# Patient Record
Sex: Female | Born: 1965 | Hispanic: No | Marital: Married | Smoking: Never smoker
Health system: Southern US, Community
[De-identification: ages and names within clinical notes are randomized; demographics above are authoritative.]

## PROBLEM LIST (undated history)

## (undated) DIAGNOSIS — E079 Disorder of thyroid, unspecified: Secondary | ICD-10-CM

## (undated) DIAGNOSIS — M1990 Unspecified osteoarthritis, unspecified site: Secondary | ICD-10-CM

## (undated) DIAGNOSIS — K602 Anal fissure, unspecified: Principal | ICD-10-CM

## (undated) DIAGNOSIS — J209 Acute bronchitis, unspecified: Secondary | ICD-10-CM

## (undated) DIAGNOSIS — L4050 Arthropathic psoriasis, unspecified: Secondary | ICD-10-CM

## (undated) DIAGNOSIS — J302 Other seasonal allergic rhinitis: Secondary | ICD-10-CM

## (undated) DIAGNOSIS — J069 Acute upper respiratory infection, unspecified: Secondary | ICD-10-CM

## (undated) HISTORY — DX: Acute upper respiratory infection, unspecified: J06.9

## (undated) HISTORY — DX: Disorder of thyroid, unspecified: E07.9

## (undated) HISTORY — DX: Other seasonal allergic rhinitis: J30.2

## (undated) HISTORY — DX: Acute bronchitis, unspecified: J20.9

## (undated) HISTORY — DX: Anal fissure, unspecified: K60.2

## (undated) HISTORY — DX: Unspecified osteoarthritis, unspecified site: M19.90

## (undated) HISTORY — DX: Arthropathic psoriasis, unspecified: L40.50

---

## 1999-12-09 ENCOUNTER — Encounter: Admission: RE | Admit: 1999-12-09 | Discharge: 2000-02-03 | Payer: Self-pay | Admitting: Family Medicine

## 2000-03-17 ENCOUNTER — Other Ambulatory Visit: Admission: RE | Admit: 2000-03-17 | Discharge: 2000-03-17 | Payer: Self-pay | Admitting: Obstetrics & Gynecology

## 2001-09-06 ENCOUNTER — Other Ambulatory Visit: Admission: RE | Admit: 2001-09-06 | Discharge: 2001-09-06 | Payer: Self-pay | Admitting: Obstetrics & Gynecology

## 2002-01-18 ENCOUNTER — Encounter: Admission: RE | Admit: 2002-01-18 | Discharge: 2002-02-27 | Payer: Self-pay | Admitting: Internal Medicine

## 2002-11-30 DIAGNOSIS — K602 Anal fissure, unspecified: Secondary | ICD-10-CM

## 2002-11-30 HISTORY — DX: Anal fissure, unspecified: K60.2

## 2003-01-01 ENCOUNTER — Other Ambulatory Visit: Admission: RE | Admit: 2003-01-01 | Discharge: 2003-01-01 | Payer: Self-pay | Admitting: Obstetrics & Gynecology

## 2004-02-18 ENCOUNTER — Other Ambulatory Visit: Admission: RE | Admit: 2004-02-18 | Discharge: 2004-02-18 | Payer: Self-pay | Admitting: Obstetrics & Gynecology

## 2004-05-14 ENCOUNTER — Other Ambulatory Visit: Admission: RE | Admit: 2004-05-14 | Discharge: 2004-05-14 | Payer: Self-pay | Admitting: Obstetrics & Gynecology

## 2005-02-19 ENCOUNTER — Ambulatory Visit: Payer: Self-pay | Admitting: Family Medicine

## 2005-02-23 ENCOUNTER — Other Ambulatory Visit: Admission: RE | Admit: 2005-02-23 | Discharge: 2005-02-23 | Payer: Self-pay | Admitting: Obstetrics & Gynecology

## 2005-02-27 ENCOUNTER — Encounter: Admission: RE | Admit: 2005-02-27 | Discharge: 2005-02-27 | Payer: Self-pay | Admitting: Family Medicine

## 2006-02-23 ENCOUNTER — Ambulatory Visit: Payer: Self-pay | Admitting: Family Medicine

## 2006-11-30 DIAGNOSIS — J209 Acute bronchitis, unspecified: Secondary | ICD-10-CM

## 2006-11-30 HISTORY — DX: Acute bronchitis, unspecified: J20.9

## 2006-12-09 ENCOUNTER — Ambulatory Visit: Payer: Self-pay | Admitting: Internal Medicine

## 2007-10-12 ENCOUNTER — Ambulatory Visit: Payer: Self-pay | Admitting: Internal Medicine

## 2007-10-12 DIAGNOSIS — J069 Acute upper respiratory infection, unspecified: Secondary | ICD-10-CM | POA: Insufficient documentation

## 2007-10-12 HISTORY — DX: Acute upper respiratory infection, unspecified: J06.9

## 2010-09-29 ENCOUNTER — Encounter: Payer: Self-pay | Admitting: Internal Medicine

## 2010-10-20 ENCOUNTER — Encounter: Payer: Self-pay | Admitting: Family Medicine

## 2010-10-28 ENCOUNTER — Ambulatory Visit: Payer: Self-pay | Admitting: Family Medicine

## 2011-01-01 NOTE — Letter (Signed)
Summary: [facility]  [facility]   Imported By: Lanelle Bal 10/09/2010 14:00:33  _____________________________________________________________________  External Attachment:    Type:   Image     Comment:   External Document

## 2011-01-01 NOTE — Miscellaneous (Signed)
Summary: Order for Pneumococcal Vaccine/[facility]  Order for Pneumococcal Vaccine/[facility]   Imported By: Lanelle Bal 11/01/2010 10:04:58  _____________________________________________________________________  External Attachment:    Type:   Image     Comment:   External Document

## 2011-01-01 NOTE — Assessment & Plan Note (Signed)
Summary: FOR PNEUMONIA SHOT//PH  Nurse Visit   Allergies: No Known Drug Allergies  Immunizations Administered:  Pneumonia Vaccine:    Vaccine Type: Pneumovax    Site: left deltoid    Mfr: Merck    Dose: 0.5 ml    Route: IM    Given by: Jeremy Johann CMA    Exp. Date: 03/31/2012    Lot #: [id number]    VIS given: 11/04/09 version given October 28, 2010.  Orders Added: 1)  Pneumococcal Vaccine [90732] 2)  Admin 1st Vaccine [16109]

## 2012-04-11 ENCOUNTER — Encounter (INDEPENDENT_AMBULATORY_CARE_PROVIDER_SITE_OTHER): Payer: Self-pay | Admitting: Surgery

## 2012-04-11 ENCOUNTER — Ambulatory Visit (INDEPENDENT_AMBULATORY_CARE_PROVIDER_SITE_OTHER): Payer: BC Managed Care – PPO | Admitting: Surgery

## 2012-04-11 VITALS — BP 112/70 | HR 64 | Temp 97.8°F | Ht 62.0 in | Wt 147.2 lb

## 2012-04-11 DIAGNOSIS — K602 Anal fissure, unspecified: Secondary | ICD-10-CM

## 2012-04-11 DIAGNOSIS — L4050 Arthropathic psoriasis, unspecified: Secondary | ICD-10-CM | POA: Insufficient documentation

## 2012-04-11 MED ORDER — AMBULATORY NON FORMULARY MEDICATION: 1.0000 "application " | Freq: Four times a day (QID) | Status: DC

## 2012-04-11 NOTE — Progress Notes (Signed)
Subjective:     Patient ID: Wendy [name], female   DOB: Jan 04, 1966, 46 y.o.   MRN: [id number]  HPI  Wendy [name]  1966/07/22 [id number]  Patient Care Team: Ezequiel Kayser, MD as PCP - General (Internal Medicine) Susy Frizzle, MD as Consulting Physician (Rheumatology)  This patient is a 46 y.o.female who presents today for surgical evaluation.   Reason for visit: Anal pain. 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  Patient is a pleasant healthy female. 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 No major changes there. She is mostly vegetarian and tends to have a high-fiber diet.  She was diagnosed with a fissure hole almost a decade ago. It resolved with diltiazem cream. Dr. Earlene Plater, my partner, has retired since that time. She noted recurrent symptoms and wished to be evaluated.  Patient notes she has pain with bowel movements. His rather short. This started about 3 weeks ago. She did try to use Preparation H. Does not help. Tends to have to wipe a lot to clean the area. Notes a little bit of blood with that. No history of Crohn's or ulcerative colitis. No major change in her bowel habits. BM 2x/day mostly "normal"  No drainage of pus. No incontinence to flatus or stool  Patient Active Problem List  Diagnoses  . Psoriasis with arthropathy  . Anal fissure    Past Medical History  Diagnosis Date  . Arthritis   . Thyroid disease   . Anal fissure 2004    Tx'd with diltiazem  gel by Dr. Kendrick Ranch  . Bronchitis, acute 2008  . URI 10/12/2007    Qualifier: Diagnosis of  By: Alwyn Ren MD, Chrissie Noa      History reviewed. No pertinent past surgical history.  History   Social History  . Marital Status: Married    Spouse Name: N/A    Number of Children: N/A  . Years of Education: N/A   Occupational History  . Not on file.   Social History Main Topics  . Smoking status: Never Smoker   . Smokeless tobacco: Not on file  . Alcohol Use: Yes     occ  . Drug  Use: No  . Sexually Active:    Other Topics Concern  . Not on file   Social History Narrative  . No narrative on file    History reviewed. No pertinent family history.  Current Outpatient Prescriptions  Medication Sig Dispense Refill  . HUMIRA PEN 40 MG/0.8ML injection       . levothyroxine (SYNTHROID, LEVOTHROID) 75 MCG tablet       . AMBULATORY NON FORMULARY MEDICATION Place 1 application rectally 4 (four) times daily. Diltiazem 2% compounded suspension.  1 Tube  2     No Known Allergies  BP 112/70  Pulse 64  Temp(Src) 97.8 F (36.6 C) (Temporal)  Ht 5\' 2"  (1.575 m)  Wt 147 lb 3.2 oz (66.769 kg)  BMI 26.92 kg/m2  SpO2 99%  No results found.   Review of Systems  Constitutional: Negative for fever, chills, diaphoresis, appetite change and fatigue.  HENT: Negative for ear pain, sore throat, trouble swallowing, neck pain and ear discharge.   Eyes: Negative for photophobia, discharge and visual disturbance.  Respiratory: Negative for cough, choking, chest tightness and shortness of breath.   Cardiovascular: Negative for chest pain and palpitations.       Patient walks 60 minutes for about 2 miles without difficulty.  No exertional chest/neck/shoulder/arm pain.   Gastrointestinal: Negative  for nausea, vomiting, abdominal pain, diarrhea, constipation, anal bleeding and rectal pain.       No personal nor family history of GI/colon cancer, inflammatory bowel disease, irritable bowel syndrome, allergy such as Celiac Sprue, dietary/dairy problems, colitis, ulcers nor gastritis.    No recent sick contacts/gastroenteritis.  No travel outside the country.  No changes in diet.    Genitourinary: Negative for dysuria, frequency and difficulty urinating.  Musculoskeletal: Negative for myalgias and gait problem.  Skin: Negative for color change, pallor and rash.  Neurological: Negative for dizziness, speech difficulty, weakness and numbness.  Hematological: Negative for adenopathy.    Psychiatric/Behavioral: Negative for confusion and agitation. The patient is not nervous/anxious.        Objective:   Physical Exam  Constitutional: She is oriented to person, place, and time. She appears well-developed and well-nourished. No distress.  HENT:  Head: Normocephalic.  Mouth/Throat: Oropharynx is clear and moist. No oropharyngeal exudate.  Eyes: Conjunctivae and EOM are normal. Pupils are equal, round, and reactive to light. No scleral icterus.  Neck: Normal range of motion. Neck supple. No tracheal deviation present.  Cardiovascular: Normal rate, regular rhythm and intact distal pulses.   Pulmonary/Chest: Effort normal and breath sounds normal. No respiratory distress. She exhibits no tenderness.  Abdominal: Soft. She exhibits no distension and no mass. There is no tenderness. Hernia confirmed negative in the right inguinal area and confirmed negative in the left inguinal area.  Genitourinary: No vaginal discharge found.       Perianal skin clean with good hygiene.  No pruritis.  No external skin tags / hemorrhoids of significance.  No pilonidal disease.    No abscess/fistula.    Small posterior midline anal fissure with minimal skin tag Sphincter tone high end of normal  Musculoskeletal: Normal range of motion. She exhibits no tenderness.  Lymphadenopathy:    She has no cervical adenopathy.       Right: No inguinal adenopathy present.       Left: No inguinal adenopathy present.  Neurological: She is alert and oriented to person, place, and time. No cranial nerve deficit. She exhibits normal muscle tone. Coordination normal.  Skin: Skin is warm and dry. No rash noted. She is not diaphoretic. No erythema.  Psychiatric: She has a normal mood and affect. Her behavior is normal. Judgment and thought content normal.       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  The  pathophysiology of anal fissure and differential diagnosis was discussed.  Natural history progression  was discussed.   I stressed the importance of a bowel regimen to have daily soft bowel movements to minimize progression of disease.   I discussed the use of warm soaks &  muscle relaxant, diltiazem, to help the anal sphincter relax, allow the spasming to stop, and help the tear/fissure to heal.  If non-operative treatment does not heal the fissure, I would recommend examination under anesthesia for better examination to confirm the diagnosis and treat by lateral internal sphincterotomy to allow the fissure to heal.  Technique, benefits, alternatives discussed.  Risks such as bleeding, pain, incontinence, recurrence, heart attack, death, and other risks were discussed.    Educational handouts further explaining the pathology, treatment options, and bowel regimen were given as well.  The patient expressed understanding & will follow up 3 weeks, PRN if improves.  If the pain does not resolve in a few weeks or worsens, we should proceed with surgery.

## 2012-04-11 NOTE — Patient Instructions (Signed)
Anal Fissure, Adult An anal fissure is a small tear or crack in the skin around the anus. Bleeding from a fissure usually stops on its own within a few minutes. However, bleeding will often reoccur with each bowel movement until the crack heals.  CAUSES   Passing large, hard stools.   Frequent diarrheal stools.   Constipation.   Inflammatory bowel disease (Crohn's disease or ulcerative colitis).   Infections.   Anal sex.  SYMPTOMS   Small amounts of blood seen on your stools, on toilet paper, or in the toilet after a bowel movement.   Rectal bleeding.   Painful bowel movements.   Itching or irritation around the anus.  DIAGNOSIS Your caregiver will examine the anal area. An anal fissure can usually be seen with careful inspection. A rectal exam may be performed and a short tube (anoscope) may be used to examine the anal canal. TREATMENT   You may be instructed to take fiber supplements. These supplements can soften your stool to help make bowel movements easier.   Sitz baths may be recommended to help heal the tear. Do not use soap in the sitz baths.   A medicated cream or ointment may be prescribed to lessen discomfort.  HOME CARE INSTRUCTIONS   Maintain a diet high in fruits, whole grains, and vegetables. Avoid constipating foods like bananas and dairy products.   Take sitz baths as directed by your caregiver.   Drink enough fluids to keep your urine clear or pale yellow.   Only take over-the-counter or prescription medicines for pain, discomfort, or fever as directed by your caregiver. Do not take aspirin as this may increase bleeding.   Do not use ointments containing numbing medications (anesthetics) or hydrocortisone. They could slow healing.  SEEK MEDICAL CARE IF:   Your fissure is not completely healed within 3 days.   You have further bleeding.   You have a fever.   You have diarrhea mixed with blood.   You have pain.   Your problem is getting worse  rather than better.  MAKE SURE YOU:   Understand these instructions.   Will watch your condition.   Will get help right away if you are not doing well or get worse.  Document Released: 11/16/2005 Document Revised: 11/05/2011 Document Reviewed: 05/03/2011 [facility] Patient Information 2012 [place], [place].  GETTING TO GOOD BOWEL HEALTH. Irregular bowel habits such as constipation and diarrhea can lead to many problems over time.  Having one soft bowel movement a day is the most important way to prevent further problems.  The anorectal canal is designed to handle stretching and feces to safely manage our ability to get rid of solid waste (feces, poop, stool) out of our body.  BUT, hard constipated stools can act like ripping concrete bricks and diarrhea can be a burning fire to this very sensitive area of our body, causing inflamed hemorrhoids, anal fissures, increasing risk is perirectal abscesses, abdominal pain/bloating, an making irritable bowel worse.     The goal: ONE SOFT BOWEL MOVEMENT A DAY!  To have soft, regular bowel movements:    Drink at least 8 tall glasses of water a day.     Take plenty of fiber.  Fiber is the undigested part of plant food that passes into the colon, acting s "natures broom" to encourage bowel motility and movement.  Fiber can absorb and hold large amounts of water. This results in a larger, bulkier stool, which is soft and easier to pass. Work gradually  over several weeks up to 6 servings a day of fiber (25g a day even more if needed) in the form of: o Vegetables -- Root (potatoes, carrots, turnips), leafy green (lettuce, salad greens, celery, spinach), or cooked high residue (cabbage, broccoli, etc) o Fruit -- Fresh (unpeeled skin & pulp), Dried (prunes, apricots, cherries, etc ),  or stewed ( applesauce)  o Whole grain breads, pasta, etc (whole wheat)  o Bran cereals    Bulking Agents -- This type of water-retaining fiber generally is easily obtained each day by  one of the following:  o Psyllium bran -- The psyllium plant is remarkable because its ground seeds can retain so much water. This product is available as Metamucil, Konsyl, Effersyllium, Per Diem Fiber, or the less expensive generic preparation in drug and health food stores. Although labeled a laxative, it really is not a laxative.  o Methylcellulose -- This is another fiber derived from wood which also retains water. It is available as Citrucel. o Polyethylene Glycol - and "artificial" fiber commonly called Miralax or Glycolax.  It is helpful for people with gassy or bloated feelings with regular fiber o Flax Seed - a less gassy fiber than psyllium   No reading or other relaxing activity while on the toilet. If bowel movements take longer than 5 minutes, you are too constipated   AVOID CONSTIPATION.  High fiber and water intake usually takes care of this.  Sometimes a laxative is needed to stimulate more frequent bowel movements, but    Laxatives are not a good long-term solution as it can wear the colon out. o Osmotics (Milk of Magnesia, Fleets phosphosoda, Magnesium citrate, MiraLax, GoLytely) are safer than  o Stimulants (Senokot, Castor Oil, Dulcolax, Ex Lax)    o Do not take laxatives for more than 7days in a row.    IF SEVERELY CONSTIPATED, try a Bowel Retraining Program: o Do not use laxatives.  o Eat a diet high in roughage, such as bran cereals and leafy vegetables.  o Drink six (6) ounces of prune or apricot juice each morning.  o Eat two (2) large servings of stewed fruit each day.  o Take one (1) heaping tablespoon of a psyllium-based bulking agent twice a day. Use sugar-free sweetener when possible to avoid excessive calories.  o Eat a normal breakfast.  o Set aside 15 minutes after breakfast to sit on the toilet, but do not strain to have a bowel movement.  o If you do not have a bowel movement by the third day, use an enema and repeat the above steps.    Controlling  diarrhea o Switch to liquids and simpler foods for a few days to avoid stressing your intestines further. o Avoid dairy products (especially milk & ice cream) for a short time.  The intestines often can lose the ability to digest lactose when stressed. o Avoid foods that cause gassiness or bloating.  Typical foods include beans and other legumes, cabbage, broccoli, and dairy foods.  Every person has some sensitivity to other foods, so listen to our body and avoid those foods that trigger problems for you. o Adding fiber (Citrucel, Metamucil, psyllium, Miralax) gradually can help thicken stools by absorbing excess fluid and retrain the intestines to act more normally.  Slowly increase the dose over a few weeks.  Too much fiber too soon can backfire and cause cramping & bloating. o Probiotics (such as active yogurt, Align, etc) may help repopulate the intestines and colon with normal bacteria  and calm down a sensitive digestive tract.  Most studies show it to be of mild help, though, and such products can be costly. o Medicines:   Bismuth subsalicylate (ex. Kayopectate, Pepto Bismol) every 30 minutes for up to 6 doses can help control diarrhea.  Avoid if pregnant.   Loperamide (Immodium) can slow down diarrhea.  Start with two tablets (4mg  total) first and then try one tablet every 6 hours.  Avoid if you are having fevers or severe pain.  If you are not better or start feeling worse, stop all medicines and call your doctor for advice o Call your doctor if you are getting worse or not better.  Sometimes further testing (cultures, endoscopy, X-ray studies, bloodwork, etc) may be needed to help diagnose and treat the cause of the diarrhea. o

## 2012-05-05 ENCOUNTER — Encounter (INDEPENDENT_AMBULATORY_CARE_PROVIDER_SITE_OTHER): Payer: BC Managed Care – PPO | Admitting: Surgery

## 2012-05-23 ENCOUNTER — Encounter (INDEPENDENT_AMBULATORY_CARE_PROVIDER_SITE_OTHER): Payer: BC Managed Care – PPO | Admitting: Surgery

## 2012-06-21 ENCOUNTER — Telehealth (INDEPENDENT_AMBULATORY_CARE_PROVIDER_SITE_OTHER): Payer: Self-pay

## 2012-06-21 NOTE — Telephone Encounter (Signed)
LMOM for pt to call me b/c I need to cancel his office on 8/7 and trying to give a new appt date and time.

## 2012-07-06 ENCOUNTER — Encounter (INDEPENDENT_AMBULATORY_CARE_PROVIDER_SITE_OTHER): Payer: BC Managed Care – PPO | Admitting: Surgery

## 2012-07-20 ENCOUNTER — Encounter (INDEPENDENT_AMBULATORY_CARE_PROVIDER_SITE_OTHER): Payer: BC Managed Care – PPO | Admitting: Surgery

## 2013-10-17 ENCOUNTER — Other Ambulatory Visit: Payer: Self-pay | Admitting: Orthopedic Surgery

## 2013-10-17 DIAGNOSIS — M542 Cervicalgia: Secondary | ICD-10-CM

## 2013-10-19 ENCOUNTER — Ambulatory Visit
Admission: RE | Admit: 2013-10-19 | Discharge: 2013-10-19 | Disposition: A | Discharge status: Home / Self Care | Payer: BC Managed Care – PPO | Source: Ambulatory Visit | Attending: Orthopedic Surgery | Admitting: Orthopedic Surgery

## 2013-10-19 DIAGNOSIS — M542 Cervicalgia: Secondary | ICD-10-CM

## 2014-08-27 ENCOUNTER — Other Ambulatory Visit: Payer: Self-pay

## 2014-08-27 DIAGNOSIS — Z1231 Encounter for screening mammogram for malignant neoplasm of breast: Secondary | ICD-10-CM

## 2014-09-04 ENCOUNTER — Encounter (INDEPENDENT_AMBULATORY_CARE_PROVIDER_SITE_OTHER): Payer: Self-pay

## 2014-09-04 ENCOUNTER — Ambulatory Visit
Admission: RE | Admit: 2014-09-04 | Discharge: 2014-09-04 | Disposition: A | Discharge status: Home / Self Care | Payer: BC Managed Care – PPO | Source: Ambulatory Visit

## 2014-09-04 DIAGNOSIS — Z1231 Encounter for screening mammogram for malignant neoplasm of breast: Secondary | ICD-10-CM

## 2016-03-03 DIAGNOSIS — M25511 Pain in right shoulder: Secondary | ICD-10-CM | POA: Diagnosis not present

## 2016-03-03 DIAGNOSIS — M25512 Pain in left shoulder: Secondary | ICD-10-CM | POA: Diagnosis not present

## 2016-03-03 DIAGNOSIS — M542 Cervicalgia: Secondary | ICD-10-CM | POA: Diagnosis not present

## 2016-03-03 DIAGNOSIS — M545 Low back pain: Secondary | ICD-10-CM | POA: Diagnosis not present

## 2016-03-09 DIAGNOSIS — M542 Cervicalgia: Secondary | ICD-10-CM | POA: Diagnosis not present

## 2016-03-09 DIAGNOSIS — M545 Low back pain: Secondary | ICD-10-CM | POA: Diagnosis not present

## 2016-03-09 DIAGNOSIS — M25511 Pain in right shoulder: Secondary | ICD-10-CM | POA: Diagnosis not present

## 2016-03-09 DIAGNOSIS — M25512 Pain in left shoulder: Secondary | ICD-10-CM | POA: Diagnosis not present

## 2016-03-19 DIAGNOSIS — M25512 Pain in left shoulder: Secondary | ICD-10-CM | POA: Diagnosis not present

## 2016-03-19 DIAGNOSIS — M545 Low back pain: Secondary | ICD-10-CM | POA: Diagnosis not present

## 2016-03-19 DIAGNOSIS — M542 Cervicalgia: Secondary | ICD-10-CM | POA: Diagnosis not present

## 2016-03-19 DIAGNOSIS — M25511 Pain in right shoulder: Secondary | ICD-10-CM | POA: Diagnosis not present

## 2016-03-25 DIAGNOSIS — M545 Low back pain: Secondary | ICD-10-CM | POA: Diagnosis not present

## 2016-03-25 DIAGNOSIS — M542 Cervicalgia: Secondary | ICD-10-CM | POA: Diagnosis not present

## 2016-03-25 DIAGNOSIS — M25511 Pain in right shoulder: Secondary | ICD-10-CM | POA: Diagnosis not present

## 2016-03-30 DIAGNOSIS — M25562 Pain in left knee: Secondary | ICD-10-CM | POA: Diagnosis not present

## 2016-03-30 DIAGNOSIS — M79672 Pain in left foot: Secondary | ICD-10-CM | POA: Diagnosis not present

## 2016-04-08 DIAGNOSIS — M542 Cervicalgia: Secondary | ICD-10-CM | POA: Diagnosis not present

## 2016-04-08 DIAGNOSIS — M25511 Pain in right shoulder: Secondary | ICD-10-CM | POA: Diagnosis not present

## 2016-04-08 DIAGNOSIS — M25512 Pain in left shoulder: Secondary | ICD-10-CM | POA: Diagnosis not present

## 2016-04-08 DIAGNOSIS — M545 Low back pain: Secondary | ICD-10-CM | POA: Diagnosis not present

## 2016-04-13 DIAGNOSIS — M25562 Pain in left knee: Secondary | ICD-10-CM | POA: Diagnosis not present

## 2016-04-13 DIAGNOSIS — M25572 Pain in left ankle and joints of left foot: Secondary | ICD-10-CM | POA: Diagnosis not present

## 2016-04-14 DIAGNOSIS — M542 Cervicalgia: Secondary | ICD-10-CM | POA: Diagnosis not present

## 2016-04-14 DIAGNOSIS — M545 Low back pain: Secondary | ICD-10-CM | POA: Diagnosis not present

## 2016-04-14 DIAGNOSIS — M25512 Pain in left shoulder: Secondary | ICD-10-CM | POA: Diagnosis not present

## 2016-04-16 DIAGNOSIS — M79672 Pain in left foot: Secondary | ICD-10-CM | POA: Diagnosis not present

## 2016-04-16 DIAGNOSIS — M94262 Chondromalacia, left knee: Secondary | ICD-10-CM | POA: Diagnosis not present

## 2016-04-16 DIAGNOSIS — M25562 Pain in left knee: Secondary | ICD-10-CM | POA: Diagnosis not present

## 2016-04-16 DIAGNOSIS — M722 Plantar fascial fibromatosis: Secondary | ICD-10-CM | POA: Diagnosis not present

## 2016-04-21 DIAGNOSIS — E559 Vitamin D deficiency, unspecified: Secondary | ICD-10-CM | POA: Diagnosis not present

## 2016-04-21 DIAGNOSIS — Z79899 Other long term (current) drug therapy: Secondary | ICD-10-CM | POA: Diagnosis not present

## 2016-04-21 DIAGNOSIS — M2550 Pain in unspecified joint: Secondary | ICD-10-CM | POA: Diagnosis not present

## 2016-04-21 DIAGNOSIS — M542 Cervicalgia: Secondary | ICD-10-CM | POA: Diagnosis not present

## 2016-04-21 DIAGNOSIS — M545 Low back pain: Secondary | ICD-10-CM | POA: Diagnosis not present

## 2016-04-24 DIAGNOSIS — S86012D Strain of left Achilles tendon, subsequent encounter: Secondary | ICD-10-CM | POA: Diagnosis not present

## 2016-04-24 DIAGNOSIS — M722 Plantar fascial fibromatosis: Secondary | ICD-10-CM | POA: Diagnosis not present

## 2016-04-24 DIAGNOSIS — M2242 Chondromalacia patellae, left knee: Secondary | ICD-10-CM | POA: Diagnosis not present

## 2016-04-24 DIAGNOSIS — M79672 Pain in left foot: Secondary | ICD-10-CM | POA: Diagnosis not present

## 2016-04-28 DIAGNOSIS — M25512 Pain in left shoulder: Secondary | ICD-10-CM | POA: Diagnosis not present

## 2016-04-28 DIAGNOSIS — M542 Cervicalgia: Secondary | ICD-10-CM | POA: Diagnosis not present

## 2016-04-28 DIAGNOSIS — M545 Low back pain: Secondary | ICD-10-CM | POA: Diagnosis not present

## 2016-05-04 DIAGNOSIS — M722 Plantar fascial fibromatosis: Secondary | ICD-10-CM | POA: Diagnosis not present

## 2016-05-04 DIAGNOSIS — S86012D Strain of left Achilles tendon, subsequent encounter: Secondary | ICD-10-CM | POA: Diagnosis not present

## 2016-05-04 DIAGNOSIS — M79672 Pain in left foot: Secondary | ICD-10-CM | POA: Diagnosis not present

## 2016-05-04 DIAGNOSIS — M2242 Chondromalacia patellae, left knee: Secondary | ICD-10-CM | POA: Diagnosis not present

## 2016-05-06 DIAGNOSIS — M25512 Pain in left shoulder: Secondary | ICD-10-CM | POA: Diagnosis not present

## 2016-05-06 DIAGNOSIS — M545 Low back pain: Secondary | ICD-10-CM | POA: Diagnosis not present

## 2016-05-06 DIAGNOSIS — M542 Cervicalgia: Secondary | ICD-10-CM | POA: Diagnosis not present

## 2016-05-06 DIAGNOSIS — M25511 Pain in right shoulder: Secondary | ICD-10-CM | POA: Diagnosis not present

## 2016-05-07 DIAGNOSIS — S86012D Strain of left Achilles tendon, subsequent encounter: Secondary | ICD-10-CM | POA: Diagnosis not present

## 2016-05-07 DIAGNOSIS — M722 Plantar fascial fibromatosis: Secondary | ICD-10-CM | POA: Diagnosis not present

## 2016-05-07 DIAGNOSIS — M7660 Achilles tendinitis, unspecified leg: Secondary | ICD-10-CM | POA: Diagnosis not present

## 2016-05-07 DIAGNOSIS — Z09 Encounter for follow-up examination after completed treatment for conditions other than malignant neoplasm: Secondary | ICD-10-CM | POA: Diagnosis not present

## 2016-05-07 DIAGNOSIS — M2242 Chondromalacia patellae, left knee: Secondary | ICD-10-CM | POA: Diagnosis not present

## 2016-05-07 DIAGNOSIS — L408 Other psoriasis: Secondary | ICD-10-CM | POA: Diagnosis not present

## 2016-05-07 DIAGNOSIS — M79672 Pain in left foot: Secondary | ICD-10-CM | POA: Diagnosis not present

## 2016-05-08 DIAGNOSIS — M2242 Chondromalacia patellae, left knee: Secondary | ICD-10-CM | POA: Diagnosis not present

## 2016-05-11 DIAGNOSIS — M2242 Chondromalacia patellae, left knee: Secondary | ICD-10-CM | POA: Diagnosis not present

## 2016-05-11 DIAGNOSIS — M79672 Pain in left foot: Secondary | ICD-10-CM | POA: Diagnosis not present

## 2016-05-11 DIAGNOSIS — S86012D Strain of left Achilles tendon, subsequent encounter: Secondary | ICD-10-CM | POA: Diagnosis not present

## 2016-05-11 DIAGNOSIS — M722 Plantar fascial fibromatosis: Secondary | ICD-10-CM | POA: Diagnosis not present

## 2016-05-18 DIAGNOSIS — M722 Plantar fascial fibromatosis: Secondary | ICD-10-CM | POA: Diagnosis not present

## 2016-05-18 DIAGNOSIS — M2242 Chondromalacia patellae, left knee: Secondary | ICD-10-CM | POA: Diagnosis not present

## 2016-05-18 DIAGNOSIS — M79672 Pain in left foot: Secondary | ICD-10-CM | POA: Diagnosis not present

## 2016-05-18 DIAGNOSIS — S86012D Strain of left Achilles tendon, subsequent encounter: Secondary | ICD-10-CM | POA: Diagnosis not present

## 2016-05-20 DIAGNOSIS — M545 Low back pain: Secondary | ICD-10-CM | POA: Diagnosis not present

## 2016-05-20 DIAGNOSIS — M25511 Pain in right shoulder: Secondary | ICD-10-CM | POA: Diagnosis not present

## 2016-05-20 DIAGNOSIS — M542 Cervicalgia: Secondary | ICD-10-CM | POA: Diagnosis not present

## 2016-05-21 DIAGNOSIS — M79672 Pain in left foot: Secondary | ICD-10-CM | POA: Diagnosis not present

## 2016-05-21 DIAGNOSIS — M2242 Chondromalacia patellae, left knee: Secondary | ICD-10-CM | POA: Diagnosis not present

## 2016-05-21 DIAGNOSIS — M722 Plantar fascial fibromatosis: Secondary | ICD-10-CM | POA: Diagnosis not present

## 2016-05-21 DIAGNOSIS — S86012D Strain of left Achilles tendon, subsequent encounter: Secondary | ICD-10-CM | POA: Diagnosis not present

## 2016-06-01 DIAGNOSIS — D225 Melanocytic nevi of trunk: Secondary | ICD-10-CM | POA: Diagnosis not present

## 2016-06-01 DIAGNOSIS — L821 Other seborrheic keratosis: Secondary | ICD-10-CM | POA: Diagnosis not present

## 2016-06-01 DIAGNOSIS — L309 Dermatitis, unspecified: Secondary | ICD-10-CM | POA: Diagnosis not present

## 2016-06-01 DIAGNOSIS — L603 Nail dystrophy: Secondary | ICD-10-CM | POA: Diagnosis not present

## 2016-06-03 DIAGNOSIS — M722 Plantar fascial fibromatosis: Secondary | ICD-10-CM | POA: Diagnosis not present

## 2016-06-03 DIAGNOSIS — M2242 Chondromalacia patellae, left knee: Secondary | ICD-10-CM | POA: Diagnosis not present

## 2016-06-03 DIAGNOSIS — S86012D Strain of left Achilles tendon, subsequent encounter: Secondary | ICD-10-CM | POA: Diagnosis not present

## 2016-06-03 DIAGNOSIS — M79672 Pain in left foot: Secondary | ICD-10-CM | POA: Diagnosis not present

## 2016-06-04 DIAGNOSIS — M722 Plantar fascial fibromatosis: Secondary | ICD-10-CM | POA: Diagnosis not present

## 2016-06-04 DIAGNOSIS — S86012D Strain of left Achilles tendon, subsequent encounter: Secondary | ICD-10-CM | POA: Diagnosis not present

## 2016-06-04 DIAGNOSIS — M2242 Chondromalacia patellae, left knee: Secondary | ICD-10-CM | POA: Diagnosis not present

## 2016-06-04 DIAGNOSIS — M79672 Pain in left foot: Secondary | ICD-10-CM | POA: Diagnosis not present

## 2016-06-17 DIAGNOSIS — Z6827 Body mass index (BMI) 27.0-27.9, adult: Secondary | ICD-10-CM | POA: Diagnosis not present

## 2016-06-17 DIAGNOSIS — B373 Candidiasis of vulva and vagina: Secondary | ICD-10-CM | POA: Diagnosis not present

## 2016-06-18 DIAGNOSIS — S86012D Strain of left Achilles tendon, subsequent encounter: Secondary | ICD-10-CM | POA: Diagnosis not present

## 2016-06-18 DIAGNOSIS — M2242 Chondromalacia patellae, left knee: Secondary | ICD-10-CM | POA: Diagnosis not present

## 2016-06-18 DIAGNOSIS — M722 Plantar fascial fibromatosis: Secondary | ICD-10-CM | POA: Diagnosis not present

## 2016-06-18 DIAGNOSIS — M79672 Pain in left foot: Secondary | ICD-10-CM | POA: Diagnosis not present

## 2016-06-24 DIAGNOSIS — M2242 Chondromalacia patellae, left knee: Secondary | ICD-10-CM | POA: Diagnosis not present

## 2016-06-24 DIAGNOSIS — S86012D Strain of left Achilles tendon, subsequent encounter: Secondary | ICD-10-CM | POA: Diagnosis not present

## 2016-06-24 DIAGNOSIS — M722 Plantar fascial fibromatosis: Secondary | ICD-10-CM | POA: Diagnosis not present

## 2016-06-24 DIAGNOSIS — M79672 Pain in left foot: Secondary | ICD-10-CM | POA: Diagnosis not present

## 2016-06-26 DIAGNOSIS — M79672 Pain in left foot: Secondary | ICD-10-CM | POA: Diagnosis not present

## 2016-06-26 DIAGNOSIS — S86012D Strain of left Achilles tendon, subsequent encounter: Secondary | ICD-10-CM | POA: Diagnosis not present

## 2016-06-26 DIAGNOSIS — M722 Plantar fascial fibromatosis: Secondary | ICD-10-CM | POA: Diagnosis not present

## 2016-06-26 DIAGNOSIS — M2242 Chondromalacia patellae, left knee: Secondary | ICD-10-CM | POA: Diagnosis not present

## 2016-06-30 DIAGNOSIS — M2242 Chondromalacia patellae, left knee: Secondary | ICD-10-CM | POA: Diagnosis not present

## 2016-06-30 DIAGNOSIS — M722 Plantar fascial fibromatosis: Secondary | ICD-10-CM | POA: Diagnosis not present

## 2016-07-01 DIAGNOSIS — M79672 Pain in left foot: Secondary | ICD-10-CM | POA: Diagnosis not present

## 2016-07-01 DIAGNOSIS — S86012D Strain of left Achilles tendon, subsequent encounter: Secondary | ICD-10-CM | POA: Diagnosis not present

## 2016-07-01 DIAGNOSIS — M722 Plantar fascial fibromatosis: Secondary | ICD-10-CM | POA: Diagnosis not present

## 2016-07-01 DIAGNOSIS — M2242 Chondromalacia patellae, left knee: Secondary | ICD-10-CM | POA: Diagnosis not present

## 2016-07-06 DIAGNOSIS — M722 Plantar fascial fibromatosis: Secondary | ICD-10-CM | POA: Diagnosis not present

## 2016-07-06 DIAGNOSIS — M2242 Chondromalacia patellae, left knee: Secondary | ICD-10-CM | POA: Diagnosis not present

## 2016-07-06 DIAGNOSIS — S86012D Strain of left Achilles tendon, subsequent encounter: Secondary | ICD-10-CM | POA: Diagnosis not present

## 2016-07-06 DIAGNOSIS — M79672 Pain in left foot: Secondary | ICD-10-CM | POA: Diagnosis not present

## 2016-07-08 DIAGNOSIS — S86012D Strain of left Achilles tendon, subsequent encounter: Secondary | ICD-10-CM | POA: Diagnosis not present

## 2016-07-08 DIAGNOSIS — M722 Plantar fascial fibromatosis: Secondary | ICD-10-CM | POA: Diagnosis not present

## 2016-07-08 DIAGNOSIS — M2242 Chondromalacia patellae, left knee: Secondary | ICD-10-CM | POA: Diagnosis not present

## 2016-07-08 DIAGNOSIS — M79672 Pain in left foot: Secondary | ICD-10-CM | POA: Diagnosis not present

## 2016-07-14 DIAGNOSIS — M545 Low back pain: Secondary | ICD-10-CM | POA: Diagnosis not present

## 2016-07-14 DIAGNOSIS — M25511 Pain in right shoulder: Secondary | ICD-10-CM | POA: Diagnosis not present

## 2016-07-14 DIAGNOSIS — M542 Cervicalgia: Secondary | ICD-10-CM | POA: Diagnosis not present

## 2016-07-14 DIAGNOSIS — M25512 Pain in left shoulder: Secondary | ICD-10-CM | POA: Diagnosis not present

## 2016-07-21 DIAGNOSIS — M25512 Pain in left shoulder: Secondary | ICD-10-CM | POA: Diagnosis not present

## 2016-07-21 DIAGNOSIS — M545 Low back pain: Secondary | ICD-10-CM | POA: Diagnosis not present

## 2016-07-21 DIAGNOSIS — M542 Cervicalgia: Secondary | ICD-10-CM | POA: Diagnosis not present

## 2016-07-24 DIAGNOSIS — M722 Plantar fascial fibromatosis: Secondary | ICD-10-CM | POA: Diagnosis not present

## 2016-07-24 DIAGNOSIS — M2242 Chondromalacia patellae, left knee: Secondary | ICD-10-CM | POA: Diagnosis not present

## 2016-07-24 DIAGNOSIS — M79672 Pain in left foot: Secondary | ICD-10-CM | POA: Diagnosis not present

## 2016-07-24 DIAGNOSIS — S86012D Strain of left Achilles tendon, subsequent encounter: Secondary | ICD-10-CM | POA: Diagnosis not present

## 2016-07-27 DIAGNOSIS — R5381 Other malaise: Secondary | ICD-10-CM | POA: Diagnosis not present

## 2016-07-27 DIAGNOSIS — E559 Vitamin D deficiency, unspecified: Secondary | ICD-10-CM | POA: Diagnosis not present

## 2016-07-27 DIAGNOSIS — Z79899 Other long term (current) drug therapy: Secondary | ICD-10-CM | POA: Diagnosis not present

## 2016-07-28 LAB — CBC AND DIFFERENTIAL
HCT: 39 % (ref 36–46)
HEMOGLOBIN: 12.5 g/dL (ref 12.0–16.0)
Platelets: 188 10*3/uL (ref 150–399)
WBC: 7.1 10*3/mL

## 2016-07-28 LAB — BASIC METABOLIC PANEL
BUN: 16 mg/dL (ref 4–21)
CREATININE: 0.9 mg/dL (ref 0.5–1.1)
Glucose: 110 mg/dL
POTASSIUM: 4.3 mmol/L (ref 3.4–5.3)
SODIUM: 139 mmol/L (ref 137–147)

## 2016-07-28 LAB — HEPATIC FUNCTION PANEL
ALT: 37 U/L — AB (ref 7–35)
AST: 32 U/L (ref 13–35)
Alkaline Phosphatase: 71 U/L (ref 25–125)
Bilirubin, Total: 0.5 mg/dL

## 2016-07-29 DIAGNOSIS — M2242 Chondromalacia patellae, left knee: Secondary | ICD-10-CM | POA: Diagnosis not present

## 2016-07-29 DIAGNOSIS — M79672 Pain in left foot: Secondary | ICD-10-CM | POA: Diagnosis not present

## 2016-07-29 DIAGNOSIS — S86012D Strain of left Achilles tendon, subsequent encounter: Secondary | ICD-10-CM | POA: Diagnosis not present

## 2016-07-29 DIAGNOSIS — M722 Plantar fascial fibromatosis: Secondary | ICD-10-CM | POA: Diagnosis not present

## 2016-07-31 DIAGNOSIS — Z780 Asymptomatic menopausal state: Secondary | ICD-10-CM | POA: Diagnosis not present

## 2016-07-31 DIAGNOSIS — L659 Nonscarring hair loss, unspecified: Secondary | ICD-10-CM | POA: Diagnosis not present

## 2016-07-31 DIAGNOSIS — R5383 Other fatigue: Secondary | ICD-10-CM | POA: Diagnosis not present

## 2016-08-01 DIAGNOSIS — L239 Allergic contact dermatitis, unspecified cause: Secondary | ICD-10-CM | POA: Diagnosis not present

## 2016-08-06 DIAGNOSIS — M2242 Chondromalacia patellae, left knee: Secondary | ICD-10-CM | POA: Diagnosis not present

## 2016-08-06 DIAGNOSIS — M79672 Pain in left foot: Secondary | ICD-10-CM | POA: Diagnosis not present

## 2016-08-06 DIAGNOSIS — M722 Plantar fascial fibromatosis: Secondary | ICD-10-CM | POA: Diagnosis not present

## 2016-08-06 DIAGNOSIS — S86012D Strain of left Achilles tendon, subsequent encounter: Secondary | ICD-10-CM | POA: Diagnosis not present

## 2016-08-10 DIAGNOSIS — M79672 Pain in left foot: Secondary | ICD-10-CM | POA: Diagnosis not present

## 2016-08-10 DIAGNOSIS — M722 Plantar fascial fibromatosis: Secondary | ICD-10-CM | POA: Diagnosis not present

## 2016-08-10 DIAGNOSIS — S86012D Strain of left Achilles tendon, subsequent encounter: Secondary | ICD-10-CM | POA: Diagnosis not present

## 2016-08-10 DIAGNOSIS — M2242 Chondromalacia patellae, left knee: Secondary | ICD-10-CM | POA: Diagnosis not present

## 2016-08-19 DIAGNOSIS — S86012D Strain of left Achilles tendon, subsequent encounter: Secondary | ICD-10-CM | POA: Diagnosis not present

## 2016-08-19 DIAGNOSIS — M79672 Pain in left foot: Secondary | ICD-10-CM | POA: Diagnosis not present

## 2016-08-19 DIAGNOSIS — M2242 Chondromalacia patellae, left knee: Secondary | ICD-10-CM | POA: Diagnosis not present

## 2016-08-19 DIAGNOSIS — M722 Plantar fascial fibromatosis: Secondary | ICD-10-CM | POA: Diagnosis not present

## 2016-08-20 DIAGNOSIS — J301 Allergic rhinitis due to pollen: Secondary | ICD-10-CM | POA: Diagnosis not present

## 2016-08-20 DIAGNOSIS — J3081 Allergic rhinitis due to animal (cat) (dog) hair and dander: Secondary | ICD-10-CM | POA: Diagnosis not present

## 2016-08-20 DIAGNOSIS — T783XXA Angioneurotic edema, initial encounter: Secondary | ICD-10-CM | POA: Diagnosis not present

## 2016-08-20 DIAGNOSIS — J3089 Other allergic rhinitis: Secondary | ICD-10-CM | POA: Diagnosis not present

## 2016-08-25 DIAGNOSIS — S86012D Strain of left Achilles tendon, subsequent encounter: Secondary | ICD-10-CM | POA: Diagnosis not present

## 2016-08-25 DIAGNOSIS — M722 Plantar fascial fibromatosis: Secondary | ICD-10-CM | POA: Diagnosis not present

## 2016-08-25 DIAGNOSIS — M79672 Pain in left foot: Secondary | ICD-10-CM | POA: Diagnosis not present

## 2016-08-25 DIAGNOSIS — M2242 Chondromalacia patellae, left knee: Secondary | ICD-10-CM | POA: Diagnosis not present

## 2016-08-31 DIAGNOSIS — Z23 Encounter for immunization: Secondary | ICD-10-CM | POA: Diagnosis not present

## 2016-08-31 DIAGNOSIS — Z1231 Encounter for screening mammogram for malignant neoplasm of breast: Secondary | ICD-10-CM | POA: Diagnosis not present

## 2016-08-31 DIAGNOSIS — Z01419 Encounter for gynecological examination (general) (routine) without abnormal findings: Secondary | ICD-10-CM | POA: Diagnosis not present

## 2016-08-31 DIAGNOSIS — Z6828 Body mass index (BMI) 28.0-28.9, adult: Secondary | ICD-10-CM | POA: Diagnosis not present

## 2016-09-24 ENCOUNTER — Other Ambulatory Visit: Payer: Self-pay | Admitting: Rheumatology

## 2016-09-25 ENCOUNTER — Telehealth: Payer: Self-pay | Admitting: Radiology

## 2016-09-25 DIAGNOSIS — Z79899 Other long term (current) drug therapy: Secondary | ICD-10-CM

## 2016-09-25 NOTE — Telephone Encounter (Signed)
Her last Vit D lab was normal Have gotten refill request for Vit D  Will call pt to discuss

## 2016-09-25 NOTE — Telephone Encounter (Signed)
ok 

## 2016-09-28 NOTE — Telephone Encounter (Signed)
I have called her to advise. She states she is aware she just needs to use the OTC Vit D she states she does need to come by for her standing orders for labs/I have placed orders and advised her of when she may come in.

## 2016-10-06 DIAGNOSIS — Z79899 Other long term (current) drug therapy: Secondary | ICD-10-CM | POA: Insufficient documentation

## 2016-10-06 DIAGNOSIS — M19072 Primary osteoarthritis, left ankle and foot: Secondary | ICD-10-CM

## 2016-10-06 DIAGNOSIS — E538 Deficiency of other specified B group vitamins: Secondary | ICD-10-CM | POA: Insufficient documentation

## 2016-10-06 DIAGNOSIS — E039 Hypothyroidism, unspecified: Secondary | ICD-10-CM | POA: Insufficient documentation

## 2016-10-06 DIAGNOSIS — M7661 Achilles tendinitis, right leg: Secondary | ICD-10-CM | POA: Insufficient documentation

## 2016-10-06 DIAGNOSIS — E559 Vitamin D deficiency, unspecified: Secondary | ICD-10-CM | POA: Insufficient documentation

## 2016-10-06 DIAGNOSIS — K635 Polyp of colon: Secondary | ICD-10-CM | POA: Insufficient documentation

## 2016-10-06 DIAGNOSIS — M19071 Primary osteoarthritis, right ankle and foot: Secondary | ICD-10-CM | POA: Insufficient documentation

## 2016-10-06 DIAGNOSIS — M545 Low back pain, unspecified: Secondary | ICD-10-CM | POA: Insufficient documentation

## 2016-10-06 DIAGNOSIS — G8929 Other chronic pain: Secondary | ICD-10-CM | POA: Insufficient documentation

## 2016-10-06 DIAGNOSIS — M722 Plantar fascial fibromatosis: Secondary | ICD-10-CM | POA: Insufficient documentation

## 2016-10-06 DIAGNOSIS — M170 Bilateral primary osteoarthritis of knee: Secondary | ICD-10-CM | POA: Insufficient documentation

## 2016-10-06 DIAGNOSIS — M7662 Achilles tendinitis, left leg: Secondary | ICD-10-CM | POA: Insufficient documentation

## 2016-10-06 NOTE — Progress Notes (Deleted)
*  IMAGE* Office Visit Note  [name]             Date of Birth: [birth year]           MRN: [id number]             PCP: Ezequiel KayserPERINI,MARK A, MD Referring: Rodrigo RanPerini, Mark, MD Visit Date: 10/08/2016 Occupation:@GUAROCC @    Subjective:     History of Present Illness: Wendy [name] is a 50 y.o. female ***   Activities of Daily Living:  Patient reports morning stiffness for *** {minute/hour:19697}.   Patient {ACTIONS;DENIES/REPORTS:21021675::"Denies"} nocturnal pain.  Difficulty dressing/grooming: {ACTIONS;DENIES/REPORTS:21021675::"Denies"} Difficulty climbing stairs: {ACTIONS;DENIES/REPORTS:21021675::"Denies"} Difficulty getting out of chair: {ACTIONS;DENIES/REPORTS:21021675::"Denies"} Difficulty using hands for taps, buttons, cutlery, and/or writing: {ACTIONS;DENIES/REPORTS:21021675::"Denies"}   No Rheumatology ROS completed.   PMFS History:  Patient Active Problem List   Diagnosis Date Noted  . High risk medication use 10/06/2016  . Plantar fasciitis 10/06/2016  . Achilles tendinitis of both lower extremities 10/06/2016  . Vitamin D deficiency 10/06/2016  . Primary osteoarthritis of both knees 10/06/2016  . Primary osteoarthritis of both feet 10/06/2016  . Chronic low back pain without sciatica 10/06/2016  . Hypothyroidism 10/06/2016  . Polyp of colon 10/06/2016  . B12 deficiency 10/06/2016  . 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 Anal fissure 04/11/2012    Past Medical History:  Diagnosis Date  . Anal fissure 2004   Tx'd with diltiazem  gel by Dr. Kendrick Ranchim Davis  . Arthritis   . Bronchitis, acute 2008  . Thyroid disease   . URI 10/12/2007   Qualifier: Diagnosis of  By: Alwyn RenHopper MD, Chrissie NoaWilliam      No family history on file. No past surgical history on file. Social History   Social History Narrative  . No narrative on file     Objective: Vital Signs: There were no vitals taken for this visit.   Physical Exam   Musculoskeletal Exam: ***  CDAI Exam: No  CDAI exam completed.    Investigation: Findings:  07/19/2016 CMP AST 37, CBC normal, vitamin D 36, 12/2016TB negative,5/2017SPEP negative,09/2010 Hepatitis panel negative    Imaging: No results found.  Speciality Comments: No specialty comments available.    Procedures:  No procedures performed Allergies: Patient has no known allergies.   Assessment / Plan: Visit Diagnoses: Psoriasis  Psoriatic arthropathy (HCC)  High risk medication use - Cosyntex( failed Humira, Enbrel, Otezla, intolerance to methotrexate and Arava)  Plantar fasciitis  Achilles tendinitis of both lower extremities  Primary osteoarthritis of both knees - Moderate  Primary osteoarthritis of both feet  Chronic low back pain  - Facet joint arthropathy  Vitamin D deficiency  B12 deficiency  Hypothyroidism, unspecified type  Polyp of colon, unspecified part of colon, unspecified type    Orders: No orders of the defined types were placed in this encounter.  No orders of the defined types were placed in this encounter.   Face-to-face time spent with patient was *** minutes. 50% of time was spent in counseling and coordination of care.  Follow-Up Instructions: No Follow-up on file.

## 2016-10-07 ENCOUNTER — Encounter: Payer: Self-pay | Admitting: *Deleted

## 2016-10-07 DIAGNOSIS — L4050 Arthropathic psoriasis, unspecified: Secondary | ICD-10-CM

## 2016-10-08 ENCOUNTER — Ambulatory Visit: Payer: Self-pay | Admitting: Rheumatology

## 2016-10-08 ENCOUNTER — Ambulatory Visit (INDEPENDENT_AMBULATORY_CARE_PROVIDER_SITE_OTHER): Payer: BLUE CROSS/BLUE SHIELD | Admitting: Rheumatology

## 2016-10-08 ENCOUNTER — Encounter: Payer: Self-pay | Admitting: Rheumatology

## 2016-10-08 VITALS — BP 131/84 | HR 67 | Resp 12 | Ht 62.0 in | Wt 154.0 lb

## 2016-10-08 DIAGNOSIS — E039 Hypothyroidism, unspecified: Secondary | ICD-10-CM

## 2016-10-08 DIAGNOSIS — E538 Deficiency of other specified B group vitamins: Secondary | ICD-10-CM | POA: Diagnosis not present

## 2016-10-08 DIAGNOSIS — M722 Plantar fascial fibromatosis: Secondary | ICD-10-CM | POA: Diagnosis not present

## 2016-10-08 DIAGNOSIS — M19072 Primary osteoarthritis, left ankle and foot: Secondary | ICD-10-CM

## 2016-10-08 DIAGNOSIS — L409 Psoriasis, unspecified: Secondary | ICD-10-CM

## 2016-10-08 DIAGNOSIS — G8929 Other chronic pain: Secondary | ICD-10-CM

## 2016-10-08 DIAGNOSIS — K635 Polyp of colon: Secondary | ICD-10-CM

## 2016-10-08 DIAGNOSIS — M170 Bilateral primary osteoarthritis of knee: Secondary | ICD-10-CM

## 2016-10-08 DIAGNOSIS — Z79899 Other long term (current) drug therapy: Secondary | ICD-10-CM | POA: Diagnosis not present

## 2016-10-08 DIAGNOSIS — L4050 Arthropathic psoriasis, unspecified: Secondary | ICD-10-CM

## 2016-10-08 DIAGNOSIS — M19071 Primary osteoarthritis, right ankle and foot: Secondary | ICD-10-CM | POA: Diagnosis not present

## 2016-10-08 DIAGNOSIS — E559 Vitamin D deficiency, unspecified: Secondary | ICD-10-CM

## 2016-10-08 DIAGNOSIS — M7661 Achilles tendinitis, right leg: Secondary | ICD-10-CM | POA: Diagnosis not present

## 2016-10-08 DIAGNOSIS — M545 Low back pain, unspecified: Secondary | ICD-10-CM

## 2016-10-08 DIAGNOSIS — M7662 Achilles tendinitis, left leg: Secondary | ICD-10-CM

## 2016-10-08 LAB — CBC WITH DIFFERENTIAL/PLATELET
BASOS PCT: 1 %
Basophils Absolute: 55 cells/uL (ref 0–200)
EOS PCT: 6 %
Eosinophils Absolute: 330 cells/uL (ref 15–500)
HEMATOCRIT: 41.3 % (ref 35.0–45.0)
Hemoglobin: 13.1 g/dL (ref 11.7–15.5)
LYMPHS PCT: 35 %
Lymphs Abs: 1925 cells/uL (ref 850–3900)
MCH: 27.8 pg (ref 27.0–33.0)
MCHC: 31.7 g/dL — AB (ref 32.0–36.0)
MCV: 87.7 fL (ref 80.0–100.0)
MONO ABS: 385 {cells}/uL (ref 200–950)
MPV: 11.9 fL (ref 7.5–12.5)
Monocytes Relative: 7 %
Neutro Abs: 2805 cells/uL (ref 1500–7800)
Neutrophils Relative %: 51 %
PLATELETS: 180 10*3/uL (ref 140–400)
RBC: 4.71 MIL/uL (ref 3.80–5.10)
RDW: 13.6 % (ref 11.0–15.0)
WBC: 5.5 10*3/uL (ref 3.8–10.8)

## 2016-10-08 LAB — COMPLETE METABOLIC PANEL WITH GFR
ALT: 30 U/L — AB (ref 6–29)
AST: 27 U/L (ref 10–35)
Albumin: 4.1 g/dL (ref 3.6–5.1)
Alkaline Phosphatase: 81 U/L (ref 33–130)
BUN: 13 mg/dL (ref 7–25)
CALCIUM: 9 mg/dL (ref 8.6–10.4)
CHLORIDE: 104 mmol/L (ref 98–110)
CO2: 24 mmol/L (ref 20–31)
CREATININE: 0.9 mg/dL (ref 0.50–1.05)
GFR, Est African American: 86 mL/min (ref >=60)
GFR, Est Non African American: 75 mL/min (ref >=60)
GLUCOSE: 85 mg/dL (ref 65–99)
POTASSIUM: 4.2 mmol/L (ref 3.5–5.3)
SODIUM: 137 mmol/L (ref 135–146)
Total Bilirubin: 0.7 mg/dL (ref 0.2–1.2)
Total Protein: 7.2 g/dL (ref 6.1–8.1)

## 2016-10-08 MED ORDER — SECUKINUMAB 150 MG/ML ~~LOC~~ SOAJ: 2.0000 "pen " | SUBCUTANEOUS | 0 refills | Status: DC

## 2016-10-08 NOTE — Progress Notes (Signed)
*IMAGE* Office Visit Note  [name]             Date of Birth: February 23, 1966           MRN: [id number]             PCP: Ezequiel KayserPERINI,MARK A, MD Referring: Rodrigo RanPerini, Mark, MD Visit Date: 10/08/2016 Occupation:@GUAROCC @    Subjective:  Pain in multiple joints   History of Present Illness: Wendy [name] is a 50 y.o. female . She's having discomfort in her multiple joints. She describes pain in her bilateral hands bilateral knees bilateral ankles and her Achilles tendon area. No joint swelling. Psoriasis is well controlled about any flare. She is tolerating her medications well. She has had some insomnia which is better with melatonin.  Activities of Daily Living:  Patient reports morning stiffness for 15 minutes.   Patient Reports nocturnal pain.  Difficulty dressing/grooming: Denies Difficulty climbing stairs: Reports Difficulty getting out of chair: Reports Difficulty using hands for taps, buttons, cutlery, and/or writing: Denies   Review of Systems  Constitutional: Positive for fatigue and weakness. Negative for night sweats, weight gain and weight loss.  HENT: Negative for mouth sores, trouble swallowing, trouble swallowing, mouth dryness and nose dryness.   Eyes: Positive for dryness. Negative for pain, redness and visual disturbance.  Respiratory: Negative for cough, shortness of breath and difficulty breathing.   Cardiovascular: Negative for chest pain, palpitations, hypertension, irregular heartbeat and swelling in legs/feet.  Gastrointestinal: Negative for blood in stool, constipation and diarrhea.  Endocrine: Negative for increased urination.  Genitourinary: Negative for vaginal dryness.  Musculoskeletal: Positive for arthralgias, joint pain, joint swelling, myalgias, morning stiffness and myalgias. Negative for muscle weakness and muscle tenderness.  Skin: Positive for rash. Negative for color change, hair loss, skin tightness, ulcers and sensitivity to sunlight.   Allergic/Immunologic: Negative for susceptible to infections.  Neurological: Negative for dizziness, memory loss and night sweats.  Hematological: Negative for swollen glands.  Psychiatric/Behavioral: Positive for sleep disturbance. Negative for depressed mood. The patient is not nervous/anxious.     PMFS History:  Patient Active Problem List   Diagnosis Date Noted  . High risk medication use 10/06/2016  . Plantar fasciitis 10/06/2016  . Achilles tendinitis of both lower extremities 10/06/2016  . Vitamin D deficiency 10/06/2016  . Primary osteoarthritis of both knees 10/06/2016  . Primary osteoarthritis of both feet 10/06/2016  . Chronic low back pain without sciatica 10/06/2016  . Hypothyroidism 10/06/2016  . Polyp of colon 10/06/2016  . B12 deficiency 10/06/2016  . 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 Anal fissure 04/11/2012    Past Medical History:  Diagnosis Date  . Anal fissure 2004   Tx'd with diltiazem  gel by Dr. Kendrick Ranchim Davis  . Arthritis   . Bronchitis, acute 2008  . Seasonal allergies   . Thyroid disease   . URI 10/12/2007   Qualifier: Diagnosis of  By: Alwyn RenHopper MD, Barbee ShropshireWilliam      Family History  Problem Relation Age of Onset  . Hypertension Mother   . Diabetes Mother    History reviewed. No pertinent surgical history. Social History   Social History Narrative  . No narrative on file     Objective: Vital Signs: BP 131/84 (BP Location: Right Arm, Patient Position: Sitting, Cuff Size: Small)   Pulse 67   Resp 12   Ht 5\' 2"  (1.575 m)   Wt 154 lb (69.9 kg)   BMI 28.17 kg/m    Physical Exam  Constitutional: She is oriented to person, place, and time. She appears well-developed and well-nourished.  HENT:  Head: Normocephalic and atraumatic.  Eyes: Conjunctivae and EOM are normal.  Neck: Normal range of motion.  Cardiovascular: Normal rate, regular rhythm, normal heart sounds and intact distal pulses.   Pulmonary/Chest: Effort normal and breath sounds  normal.  Abdominal: Soft. Bowel sounds are normal.  Lymphadenopathy:    She has no cervical adenopathy.  Neurological: She is alert and oriented to person, place, and time.  Skin: Skin is warm and dry. Capillary refill takes less than 2 seconds.  Psychiatric: She has a normal mood and affect. Her behavior is normal.  Nursing note and vitals reviewed.    Musculoskeletal Exam: C-spine good range of motion with some stiffness, lumbar spine, thoracic spine good range of motion with no discomfort. She had mild tenderness over left SI joint. Her shoulder joints, bilateral elbow joints bilateral wrist joints bilateral MCPs PIPs DIPs were all good range of motion with no synovitis. She has good range of motion of her hip joints knee joints ankles MTPs PIPs DIPs of her feet. With no synovitis she has some tenderness over a nodule on her right Achilles tendon. No Achilles tendinitis was noted. She has tenderness over her left heel where she has calcaneal spur.  CDAI Exam: No CDAI exam completed.    Investigation: Findings:  07/19/2016 CMP AST 37, CBC normal, vitamin D 36, 12/2016TB negative,5/2017SPEP negative,09/2010 Hepatitis panel negative    Imaging: No results found.  Speciality Comments: No specialty comments available.    Procedures:  No procedures performed Allergies: Arava [leflunomide] and Methotrexate derivatives   Assessment / Plan: Visit Diagnoses:  Psoriasis: She doesn't have any active lesions currently  Psoriatic arthropathy (HCC): She has no active synovitis on examination although she continues to have arthralgias in multiple joints. She is tolerating her medications well I would obtain CBC comprehensive metabolic panel, sedimentation rate, vitamin D today. She's been experiencing some fatigue and weakness. At this point there'll be no change in treatment.  High risk medication use - Cosyntex( failed Humira, Enbrel, Otezla, intolerance to methotrexate and Arava) her  labs have been stable and we'll check her labs today and then every 3 months to monitor for drug toxicity.  Plantar fasciitis: Has improved . She's been advised to use orthotics on regular.   Achilles tendinitis of both lower extremities: Has resolved  Primary osteoarthritis of both knees - Moderate: She has some chronic discomfort. She's also gained weight weight loss diet and exercise was discussed.  Primary osteoarthritis of both feet: Proper fitting shoes will be helpful.  Chronic low back pain  - Facet joint arthropathy  Vitamin D deficiency: We'll check vitamin D levels today.  B12 deficiency  Hypothyroidism, unspecified type  Polyp of colon, unspecified part of colon, unspecified type    Orders: Orders Placed This Encounter  Procedures  . CBC with Differential/Platelet  . COMPLETE METABOLIC PANEL WITH GFR  . Sedimentation rate  . VITAMIN D 25 Hydroxy (Vit-D Deficiency, Fractures)  . CBC with Differential/Platelet  . COMPLETE METABOLIC PANEL WITH GFR   Meds ordered this encounter  Medications                                                     . Secukinumab (COSENTYX SENSOREADY 300 DOSE) 150 MG/ML  SOAJ    Sig: Inject 2 pens into the skin every 30 (thirty) days.    Dispense:  6 pen    Refill:  0    Face-to-face time spent with patient was 30 minutes. 50% of time was spent in counseling and coordination of care.  Follow-Up Instructions: Return in about 4 months (around 02/05/2017) for Psoriatic arthritis.   Pollyann Savoy, MD, Aileen Pilot

## 2016-10-08 NOTE — Patient Instructions (Signed)
Standing Labs We placed an order today for your standing lab work.    Please come back and get your standing labs in February and every 3 months  We have open lab Monday through Friday from 8:30-11:30 AM and 1-4 PM at the office of Dr. Arbutus PedShaili Adolf [place]/Naitik Panwala, PA.   The office is located at [address], [address], [place], [place] [address] No appointment is necessary.   [facility] are drawn by [facility].  You may receive a bill from [facility] for your lab work.   Supplements for OA Natural anti-inflammatories  You can purchase these at [employer], [employer] or online.  . Turmeric (capsules)  . Ginger (ginger root or capsules)  . Omega 3 (Fish, flax seeds, chia seeds, walnuts, almonds)  . Tart cherry (dried or extract)   Patient should be under the care of a physician while taking these supplements. This may not be reproduced without the permission of Dr. Pollyann SavoyShaili Kaysee Hergert.

## 2016-10-09 ENCOUNTER — Telehealth: Payer: Self-pay | Admitting: Radiology

## 2016-10-09 LAB — SEDIMENTATION RATE: Sed Rate: 4 mm/hr (ref 0–20)

## 2016-10-09 LAB — VITAMIN D 25 HYDROXY (VIT D DEFICIENCY, FRACTURES): Vit D, 25-Hydroxy: 30 ng/mL (ref 30–100)

## 2016-10-09 NOTE — Telephone Encounter (Signed)
Clarified with Dr D should state 1000 mg Vit D daily

## 2016-10-09 NOTE — Telephone Encounter (Signed)
-----   Message from Pollyann SavoyShaili Deveshwar, MD sent at 10/09/2016 12:29 PM EST ----- Normal labs, May take Vit D 10000U qd

## 2016-10-09 NOTE — Progress Notes (Signed)
Normal labs, May take Vit D 10000U qd

## 2016-11-10 ENCOUNTER — Other Ambulatory Visit: Payer: Self-pay | Admitting: Pharmacist

## 2016-11-10 ENCOUNTER — Other Ambulatory Visit: Payer: Self-pay | Admitting: Radiology

## 2016-11-10 DIAGNOSIS — Z79899 Other long term (current) drug therapy: Secondary | ICD-10-CM

## 2016-11-10 DIAGNOSIS — L4050 Arthropathic psoriasis, unspecified: Secondary | ICD-10-CM

## 2016-11-10 MED ORDER — SECUKINUMAB 150 MG/ML ~~LOC~~ SOAJ: 2.0000 "pen " | SUBCUTANEOUS | 0 refills | Status: DC

## 2016-11-10 NOTE — Telephone Encounter (Signed)
Patient requested sample of Cosentyx.   Last visit: 10/08/16 Next visit: 02/05/17 Standing labs: CBC normal on 10/08/16, CMP normal except ALT of 30 on 10/08/16 TB Gold: negative on 11/04/15, due at this time  [facility] Dr. Corliss Skainseveshwar if patient gets TB Gold test drawn.    Called patient who confirmed she will get her TB Gold drawn today and will pick up medication sample.   Medication Samples have been provided to the patient.  Drug name: Cosentyx       Strength: 300 mg        Qty: 1 box   LOT: [id number]  Exp.Date: 10/2017  Dosing instructions: Inject under the skin once a month.    The patient has been instructed regarding the correct time, dose, and frequency of taking this medication, including desired effects and most common side effects.    Wendy [name] 10:00 AM 11/10/2016

## 2016-11-10 NOTE — Telephone Encounter (Signed)
Sent in cosentyx per Dr Deveshwar/ patient has called her after hours to request refill

## 2016-11-12 LAB — QUANTIFERON TB GOLD ASSAY (BLOOD)
INTERFERON GAMMA RELEASE ASSAY: NEGATIVE
Mitogen-Nil: >10 IU/mL
QUANTIFERON NIL VALUE: 0.02 [IU]/mL
Quantiferon Tb Ag Minus Nil Value: 0 IU/mL

## 2016-11-12 NOTE — Progress Notes (Signed)
TB neg

## 2016-11-17 ENCOUNTER — Telehealth: Payer: Self-pay | Admitting: Pharmacist

## 2016-11-17 NOTE — Telephone Encounter (Signed)
I received a letter in the mail from [employer] for [employer]entyx stating patient was interested in applying.  I called patient to discuss.  I advised her that since she has commercial insurance she would not qualify for the patient assistance foundation.  I asked patient whether she was having difficulty getting Cosentyx through her insurance company.  Patient reports that at the end of the year patient has had trouble getting her Cosentyx prescription because she states her insurance was maxed out.  I confirmed with patient that she has a Cosentyx copay card.  Patient did receive a Cosentyx sample from our office on 11/10/16.  Patient confirms she will not need her prescription again until January 2018.  I will run a prior authorization through patient's insurance to ensure medication will be covered for 2018.  Advised patient to call me if she has any further questions or concerns regarding her medications.    Lilla Shookachel Trust Leh, Pharm.D., BCPS Clinical Pharmacist Pager: [phone] Phone: [phone] 11/17/2016 4:13 PM

## 2016-11-18 ENCOUNTER — Telehealth: Payer: Self-pay

## 2016-11-18 NOTE — Telephone Encounter (Signed)
Spoke with Mrs. Harshberger about her authorization for Cosentyx. I informed her that the representative ran a "test claim" and go a paid claim through her insurance.  She voiced understanding that she needs to call insurance to verify her benefit details as to why her co-pay is so expensive.

## 2016-11-18 NOTE — Telephone Encounter (Signed)
Submitted a prior authorization for Cosentyx through [employer]. Received a rejection stating that there was a claim already submitted for this patient. Was informed by a representative from [employer] that the medication was authorized from 05/18/15 through the life of the patients policy. I questioned if there was a max amount the insurance would cover per year for this medication and was told the patient needs to call the benefits department at [phone] to discuss the details.   Let message with family member for Wendy [name] to call us back.  Odysseus Cada, [place], CPhT

## 2016-12-04 ENCOUNTER — Telehealth: Payer: Self-pay | Admitting: Pharmacist

## 2016-12-04 NOTE — Telephone Encounter (Signed)
Received call from patient stating she is still having problems filling her Cosentyx.  We have received approval for patient's Cosentyx through 2039 from [facility].  I called [facility] to check.  She confirms they have the document stating patient is approved through 2039.  She is going to have the billing department call patient's insurance to find out why the claim is rejecting.  [facility] reports [facility] should get a phone call in an hour.    I called patient to update her.  Patient reports she is not due for her Cosentyx until 12/19/16.  Advised patient to call me next week if she is still having problems getting her Cosentyx filled.  Patient voiced understanding and denied any other questions at this time.    Wendy [name], Pharm.D., BCPS Clinical Pharmacist Pager: [phone] Phone: [phone] 12/04/2016 2:55 PM

## 2016-12-09 DIAGNOSIS — E038 Other specified hypothyroidism: Secondary | ICD-10-CM | POA: Diagnosis not present

## 2016-12-09 DIAGNOSIS — Z0000 Encounter for general adult medical examination without abnormal findings: Secondary | ICD-10-CM | POA: Diagnosis not present

## 2016-12-09 DIAGNOSIS — E559 Vitamin D deficiency, unspecified: Secondary | ICD-10-CM | POA: Diagnosis not present

## 2016-12-09 DIAGNOSIS — E538 Deficiency of other specified B group vitamins: Secondary | ICD-10-CM | POA: Diagnosis not present

## 2016-12-10 ENCOUNTER — Telehealth: Payer: Self-pay | Admitting: Rheumatology

## 2016-12-10 NOTE — Telephone Encounter (Signed)
[facility] pharmacy called and they have questions about a prior auth for cosentyx. Please call [phone]

## 2016-12-10 NOTE — Telephone Encounter (Signed)
Called [facility] and spoke to the pharmacist.  A three way call was made to [facility] [place] who confirmed that patient will need a new prior authorization for Cosentyx for 2018.  PA was submitted through Cover My Meds by Abran Dukehasta Hopkins, Pharmacy Technician.  I called patient to update her and left message.     Lilla Shookachel Henderson, Pharm.D., BCPS Clinical Pharmacist Pager: [phone] Phone: [phone] 12/10/2016 3:46 PM

## 2016-12-11 ENCOUNTER — Telehealth: Payer: Self-pay

## 2016-12-11 NOTE — Telephone Encounter (Signed)
Received a conformation from covermymeds that pts cosentyx was approved from 12/10/16-11/29/2038. Spoke with Angelique Blonderenise from [facility] to check the status of patients refill. She state she showed a paid claim. Copay was $1,311.40.   Have left a message for patient to call me back to see if she has a copay card or if we need to enroll her into one.

## 2016-12-11 NOTE — Telephone Encounter (Signed)
Received a conformation from CoverMyMeds with the approval from [facility] for Cosentyx. Patient is covered from 12/10/16-11/29/2038.   Spoke with patient to update her on the results. Patient voiced understanding and will contact pharamcy to set up a refill.   Wendy [name], [place], CPhT

## 2016-12-11 NOTE — Telephone Encounter (Signed)
Spoke to patient.  Informed her that the Cosentyx was approved by her insurance, and the pharmacy was able to fill the prescription.  Discussed that there was a high copay, and patient confirms she has a copay card.  She reports she will call the pharmacy to provide them with the copay card information.  Advised patient to call me if she has any further difficulties with her prescription.     Lilla Shookachel Henderson, Pharm.D., BCPS Clinical Pharmacist Pager: [phone] Phone: [phone] 12/11/2016 4:48 PM

## 2016-12-13 DIAGNOSIS — J019 Acute sinusitis, unspecified: Secondary | ICD-10-CM | POA: Diagnosis not present

## 2016-12-13 DIAGNOSIS — J209 Acute bronchitis, unspecified: Secondary | ICD-10-CM | POA: Diagnosis not present

## 2016-12-17 DIAGNOSIS — Z0000 Encounter for general adult medical examination without abnormal findings: Secondary | ICD-10-CM | POA: Diagnosis not present

## 2016-12-17 DIAGNOSIS — D508 Other iron deficiency anemias: Secondary | ICD-10-CM | POA: Diagnosis not present

## 2016-12-17 DIAGNOSIS — Z1389 Encounter for screening for other disorder: Secondary | ICD-10-CM | POA: Diagnosis not present

## 2016-12-17 DIAGNOSIS — J208 Acute bronchitis due to other specified organisms: Secondary | ICD-10-CM | POA: Diagnosis not present

## 2016-12-17 DIAGNOSIS — E559 Vitamin D deficiency, unspecified: Secondary | ICD-10-CM | POA: Diagnosis not present

## 2016-12-17 DIAGNOSIS — Z6828 Body mass index (BMI) 28.0-28.9, adult: Secondary | ICD-10-CM | POA: Diagnosis not present

## 2016-12-17 DIAGNOSIS — M722 Plantar fascial fibromatosis: Secondary | ICD-10-CM | POA: Diagnosis not present

## 2016-12-17 DIAGNOSIS — E538 Deficiency of other specified B group vitamins: Secondary | ICD-10-CM | POA: Diagnosis not present

## 2016-12-29 DIAGNOSIS — H1013 Acute atopic conjunctivitis, bilateral: Secondary | ICD-10-CM | POA: Diagnosis not present

## 2017-01-26 DIAGNOSIS — Z23 Encounter for immunization: Secondary | ICD-10-CM | POA: Diagnosis not present

## 2017-01-29 NOTE — Progress Notes (Deleted)
Office Visit Note  [name]             Date of Birth: 1966-10-09           MRN: [id number]             PCP: Jerlyn Ly, MD Referring: Crist Infante, MD Visit Date: 02/05/2017 Occupation: @GUAROCC @    Subjective:  No chief complaint on file.   History of Present Illness: Wendy [name] is a 51 y.o. female ***   Activities of Daily Living:  Patient reports morning stiffness for *** {minute/hour:19697}.   Patient {ACTIONS;DENIES/REPORTS:21021675::"Denies"} nocturnal pain.  Difficulty dressing/grooming: {ACTIONS;DENIES/REPORTS:21021675::"Denies"} Difficulty climbing stairs: {ACTIONS;DENIES/REPORTS:21021675::"Denies"} Difficulty getting out of chair: {ACTIONS;DENIES/REPORTS:21021675::"Denies"} Difficulty using hands for taps, buttons, cutlery, and/or writing: {ACTIONS;DENIES/REPORTS:21021675::"Denies"}   No Rheumatology ROS completed.   PMFS History:  Patient Active Problem List   Diagnosis Date Noted  . High risk medication use 10/06/2016  . Plantar fasciitis 10/06/2016  . Achilles tendinitis of both lower extremities 10/06/2016  . Vitamin D deficiency 10/06/2016  . Primary osteoarthritis of both knees 10/06/2016  . Primary osteoarthritis of both feet 10/06/2016  . Chronic low back pain without sciatica 10/06/2016  . Hypothyroidism 10/06/2016  . Polyp of colon 10/06/2016  . B12 deficiency 10/06/2016  . Psoriatic arthropathy ([place]) 04/11/2012  . Anal fissure 04/11/2012    Past Medical History:  Diagnosis Date  . Anal fissure 2004   Tx'd with diltiazem  gel by Dr. Lennie Hummer  . Arthritis   . Bronchitis, acute 2008  . Seasonal allergies   . Thyroid disease   . URI 10/12/2007   Qualifier: Diagnosis of  By: Linna Darner MD, Virl Diamond History  Problem Relation Age of Onset  . Hypertension Mother   . Diabetes Mother    No past surgical history on file. Social History   Social History Narrative  . No narrative on file     Objective: Vital  Signs: There were no vitals taken for this visit.   Physical Exam   Musculoskeletal Exam: ***  CDAI Exam: No CDAI exam completed.    Investigation: Findings:  December 2015:  CBC, comprehensive metabolic panel, hep panel, TB Gold were normal.  Her platelets were slightly on the lower side at 145.    05/12/2014 We obtained ultrasound of bilateral hands today to look for any underlying synovitis and tenosynovitis.  Right and complete exam was obtained.  Per EULAR recommendation, ultrasound examination of the bilateral hands was obtained using 12 MHz transducer, grayscale and power Doppler to look for synovitis and tenosynovitis of the right hand second, third, and fifth MCP joint and wrist joint both dorsal and volar aspect and left hand only the wrist joint dorsal and volar aspects were evaluated.  The findings were she had no synovitis or tenosynovitis in her right hand and also no synovitis or tenosynovitis in her left hand.  Right median nerve was 0.1 cm squared, which was more than upper limits of normal and left median nerve was 0.11 cm squared, which was within normal limits.  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     11/16/2012 We obtained x-ray of C-spine in the office today, AP and lateral view which showed C5-6 significant narrowing without any listhesis.  Lumbar spine x-ray shows mild spondylosis.  Purgason pelvis x-rays showed mild sclerosis without any erosive changes.       Orders Only on  11/10/2016  Component Date Value Ref Range Status  . Interferon Gamma Release Assay 11/10/2016 NEGATIVE  NEGATIVE Final  . Quantiferon Nil Value 11/10/2016 0.02  IU/mL Final  . Mitogen-Nil 11/10/2016 >10.00  IU/mL Final  . Quantiferon Tb Ag Minus Nil Value 11/10/2016 0.00  IU/mL Final   Comment:   The Nil tube value is used to determine if the patient has a preexisting immune response which could cause a  false-positive reading on the test. In order for a test to be valid, the Nil tube must have a value of less than or equal to 8.0 IU/mL.   The mitogen control tube is used to assure the patient has a healthy immune status and also serves as a control for correct blood handling and incubation. It is used to detect false-negative readings. The mitogen tube must have a gamma interferon value of greater than or equal to 0.5 IU/mL higher than the value of the Nil tube.   The TB antigen tube is coated with the M. tuberculosis specific antigens. For a test to be considered positive, the TB antigen tube value minus the Nil tube value must be greater than or equal to 0.35 IU/mL.   For additional information, please refer to http://education.questdiagnostics.com/faq/QFT (This link is being provided for informational/educational purposes only.)   Office Visit on 10/08/2016  Component Date Value Ref Range Status  . WBC 10/08/2016 5.5  3.8 - 10.8 K/uL Final  . RBC 10/08/2016 4.71  3.80 - 5.10 MIL/uL Final  . Hemoglobin 10/08/2016 13.1  11.7 - 15.5 g/dL Final  . HCT 10/08/2016 41.3  35.0 - 45.0 % Final  . MCV 10/08/2016 87.7  80.0 - 100.0 fL Final  . MCH 10/08/2016 27.8  27.0 - 33.0 pg Final  . MCHC 10/08/2016 31.7* 32.0 - 36.0 g/dL Final  . RDW 10/08/2016 13.6  11.0 - 15.0 % Final  . Platelets 10/08/2016 180  140 - 400 K/uL Final  . MPV 10/08/2016 11.9  7.5 - 12.5 fL Final  . Neutro Abs 10/08/2016 2805  1,500 - 7,800 cells/uL Final  . Lymphs Abs 10/08/2016 1925  850 - 3,900 cells/uL Final  . Monocytes Absolute 10/08/2016 385  200 - 950 cells/uL Final  . Eosinophils Absolute 10/08/2016 330  15 - 500 cells/uL Final  . Basophils Absolute 10/08/2016 55  0 - 200 cells/uL Final  . Neutrophils Relative % 10/08/2016 51  % Final  . Lymphocytes Relative 10/08/2016 35  % Final  . Monocytes Relative 10/08/2016 7  % Final  . Eosinophils Relative 10/08/2016 6  % Final  . Basophils Relative 10/08/2016 1   % Final  . Smear Review 10/08/2016 Criteria for review not met   Final  . Sodium 10/08/2016 137  135 - 146 mmol/L Final  . Potassium 10/08/2016 4.2  3.5 - 5.3 mmol/L Final  . Chloride 10/08/2016 104  98 - 110 mmol/L Final  . CO2 10/08/2016 24  20 - 31 mmol/L Final  . Glucose, Bld 10/08/2016 85  65 - 99 mg/dL Final  . BUN 10/08/2016 13  7 - 25 mg/dL Final  . Creat 10/08/2016 0.90  0.50 - 1.05 mg/dL Final   Comment:   For patients > or = 52 years of age: The upper reference limit for Creatinine is approximately 13% higher for people identified as African-American.     . Total Bilirubin 10/08/2016 0.7  0.2 - 1.2 mg/dL Final  . Alkaline Phosphatase 10/08/2016 81  33 - 130 U/L Final  .  AST 10/08/2016 27  10 - 35 U/L Final  . ALT 10/08/2016 30* 6 - 29 U/L Final  . Total Protein 10/08/2016 7.2  6.1 - 8.1 g/dL Final  . Albumin 10/08/2016 4.1  3.6 - 5.1 g/dL Final  . Calcium 10/08/2016 9.0  8.6 - 10.4 mg/dL Final  . GFR, Est African American 10/08/2016 86  >=60 mL/min Final  . GFR, Est Non African American 10/08/2016 75  >=60 mL/min Final  . Sed Rate 10/08/2016 4  0 - 20 mm/hr Final  . Vit D, 25-Hydroxy 10/08/2016 30  30 - 100 ng/mL Final   Comment: Vitamin D Status           25-OH Vitamin D        Deficiency                <20 ng/mL        Insufficiency         20 - 29 ng/mL        Optimal             > or = 30 ng/mL   For 25-OH Vitamin D testing on patients on D2-supplementation and patients for whom quantitation of D2 and D3 fractions is required, the [facility] 25-OH VIT D, (D2,D3), LC/MS/MS is recommended: order code [id number] (patients > 2 yrs).   Abstract on 10/07/2016  Component Date Value Ref Range Status  . Hemoglobin 07/28/2016 12.5  12.0 - 16.0 g/dL Final  . HCT 07/28/2016 39  36 - 46 % Final  . Platelets 07/28/2016 188  150 - 399 K/L Final  . WBC 07/28/2016 7.1  10^3/mL Final  . Glucose 07/28/2016 110  mg/dL Final  . BUN 07/28/2016 16  4 - 21 mg/dL Final  . Creatinine  07/28/2016 0.9  0.5 - 1.1 mg/dL Final  . Potassium 07/28/2016 4.3  3.4 - 5.3 mmol/L Final  . Sodium 07/28/2016 139  137 - 147 mmol/L Final  . Alkaline Phosphatase 07/28/2016 71  25 - 125 U/L Final  . ALT 07/28/2016 37* 7 - 35 U/L Final  . AST 07/28/2016 32  13 - 35 U/L Final  . Bilirubin, Total 07/28/2016 0.5  mg/dL Final     Imaging: No results found.  Speciality Comments: No specialty comments available.    Procedures:  No procedures performed Allergies: Arava [leflunomide] and Methotrexate derivatives   Assessment / Plan:     Visit Diagnoses: Psoriatic arthropathy ([place])  Primary osteoarthritis of both feet  Plantar fasciitis  Achilles tendinitis of both lower extremities  High risk medication use  Primary osteoarthritis of both knees  Vitamin D deficiency  History of hypothyroidism    Orders: No orders of the defined types were placed in this encounter.  No orders of the defined types were placed in this encounter.   Face-to-face time spent with patient was *** minutes. 50% of time was spent in counseling and coordination of care.  Follow-Up Instructions: No Follow-up on file.   Amy Littrell, RT  Note - This record has been created using [employer].  Chart creation errors have been sought, but may not always  have been located. Such creation errors do not reflect on  the standard of medical care.

## 2017-02-03 DIAGNOSIS — M47816 Spondylosis without myelopathy or radiculopathy, lumbar region: Secondary | ICD-10-CM | POA: Insufficient documentation

## 2017-02-03 DIAGNOSIS — M47812 Spondylosis without myelopathy or radiculopathy, cervical region: Secondary | ICD-10-CM | POA: Insufficient documentation

## 2017-02-04 ENCOUNTER — Ambulatory Visit (INDEPENDENT_AMBULATORY_CARE_PROVIDER_SITE_OTHER): Payer: BLUE CROSS/BLUE SHIELD | Admitting: Rheumatology

## 2017-02-04 ENCOUNTER — Encounter: Payer: Self-pay | Admitting: Rheumatology

## 2017-02-04 VITALS — BP 120/74 | HR 78 | Resp 14 | Ht 62.0 in | Wt 154.0 lb

## 2017-02-04 DIAGNOSIS — M5030 Other cervical disc degeneration, unspecified cervical region: Secondary | ICD-10-CM | POA: Diagnosis not present

## 2017-02-04 DIAGNOSIS — M170 Bilateral primary osteoarthritis of knee: Secondary | ICD-10-CM | POA: Diagnosis not present

## 2017-02-04 DIAGNOSIS — M722 Plantar fascial fibromatosis: Secondary | ICD-10-CM

## 2017-02-04 DIAGNOSIS — M7661 Achilles tendinitis, right leg: Secondary | ICD-10-CM

## 2017-02-04 DIAGNOSIS — M19071 Primary osteoarthritis, right ankle and foot: Secondary | ICD-10-CM

## 2017-02-04 DIAGNOSIS — M7662 Achilles tendinitis, left leg: Secondary | ICD-10-CM | POA: Diagnosis not present

## 2017-02-04 DIAGNOSIS — E559 Vitamin D deficiency, unspecified: Secondary | ICD-10-CM | POA: Diagnosis not present

## 2017-02-04 DIAGNOSIS — M47812 Spondylosis without myelopathy or radiculopathy, cervical region: Secondary | ICD-10-CM

## 2017-02-04 DIAGNOSIS — M533 Sacrococcygeal disorders, not elsewhere classified: Secondary | ICD-10-CM

## 2017-02-04 DIAGNOSIS — M19072 Primary osteoarthritis, left ankle and foot: Secondary | ICD-10-CM

## 2017-02-04 DIAGNOSIS — Z79899 Other long term (current) drug therapy: Secondary | ICD-10-CM | POA: Diagnosis not present

## 2017-02-04 DIAGNOSIS — L4050 Arthropathic psoriasis, unspecified: Secondary | ICD-10-CM | POA: Diagnosis not present

## 2017-02-04 DIAGNOSIS — M47816 Spondylosis without myelopathy or radiculopathy, lumbar region: Secondary | ICD-10-CM

## 2017-02-04 LAB — CBC WITH DIFFERENTIAL/PLATELET
BASOS ABS: 69 {cells}/uL (ref 0–200)
BASOS PCT: 1 %
EOS ABS: 345 {cells}/uL (ref 15–500)
Eosinophils Relative: 5 %
HEMATOCRIT: 38.3 % (ref 35.0–45.0)
Hemoglobin: 12.5 g/dL (ref 11.7–15.5)
LYMPHS PCT: 31 %
Lymphs Abs: 2139 cells/uL (ref 850–3900)
MCH: 28.1 pg (ref 27.0–33.0)
MCHC: 32.6 g/dL (ref 32.0–36.0)
MCV: 86.1 fL (ref 80.0–100.0)
MONO ABS: 483 {cells}/uL (ref 200–950)
MONOS PCT: 7 %
MPV: 12 fL (ref 7.5–12.5)
NEUTROS PCT: 56 %
Neutro Abs: 3864 cells/uL (ref 1500–7800)
PLATELETS: 174 10*3/uL (ref 140–400)
RBC: 4.45 MIL/uL (ref 3.80–5.10)
RDW: 13.9 % (ref 11.0–15.0)
WBC: 6.9 10*3/uL (ref 3.8–10.8)

## 2017-02-04 LAB — COMPLETE METABOLIC PANEL WITH GFR
ALT: 22 U/L (ref 6–29)
AST: 20 U/L (ref 10–35)
Albumin: 3.8 g/dL (ref 3.6–5.1)
Alkaline Phosphatase: 79 U/L (ref 33–130)
BILIRUBIN TOTAL: 0.3 mg/dL (ref 0.2–1.2)
BUN: 12 mg/dL (ref 7–25)
CALCIUM: 8.8 mg/dL (ref 8.6–10.4)
CHLORIDE: 105 mmol/L (ref 98–110)
CO2: 24 mmol/L (ref 20–31)
CREATININE: 0.82 mg/dL (ref 0.50–1.05)
GFR, EST NON AFRICAN AMERICAN: 83 mL/min (ref >=60)
Glucose, Bld: 91 mg/dL (ref 65–99)
Potassium: 3.7 mmol/L (ref 3.5–5.3)
Sodium: 138 mmol/L (ref 135–146)
Total Protein: 6.7 g/dL (ref 6.1–8.1)

## 2017-02-04 NOTE — Patient Instructions (Signed)
Standing Labs We placed an order today for your standing lab work.    Please come back and get your standing labs in May and every 3 months  We have open lab Monday through Friday from 8:30-11:30 AM and 1:30-4 PM at the office of Dr. Jaelynn Currier/Naitik Panwala, PA.   The office is located at [address], [address], [place], [place] [address] No appointment is necessary.   Labs are drawn by [facility].  You may receive a bill from [facility] for your lab work.    

## 2017-02-04 NOTE — Progress Notes (Signed)
Rheumatology Medication Review by a Pharmacist Does the patient feel that his/her medications are working for him/her?  Patient does not feel the medication is working as well as it used to.   Has the patient been experiencing any side effects to the medications prescribed?  No Does the patient have any problems obtaining medications?  No - patient was having issues getting her Cosentyx previously, but reports she has been able to get the medication lately.    Issues to address at subsequent visits: None   Pharmacist comments:  Wendy SizerDolly is a pleasant 51 yo F who presents for follow up of her psoriatic arthritis.  She is currently taking Cosentyx 300 mg every 4 weeks.  Most recent standing labs were on 10/08/16.  Patient is due for standing labs again today.  Most recent TB Gold was negative on 11/10/16.  She will be due for TB Gold again in December 2018.    Wendy [name], Pharm.D., BCPS, CPP Clinical Pharmacist Pager: [phone] Phone: [phone] 02/04/2017 4:01 PM

## 2017-02-04 NOTE — Progress Notes (Signed)
Office Visit Note  [name]             Date of Birth: 28-May-1966           MRN: [id number]             PCP: Ezequiel KayserPERINI,MARK A, MD Referring: Rodrigo RanPerini, Mark, MD Visit Date: 02/04/2017 Occupation: @GUAROCC @    Subjective:  Joint Pain (knees SI joints ankes painful) and Follow-up   History of Present Illness: Wendy [name] is a 51 y.o. female with history of psoriatic arthritis and osteoarthritis. She states she continues to have more of pain and stiffness in multiple joints. She describes pain in her lower back bilateral hands bilateral knee joints and her feet. She has discomfort in her bilateral wrist tendon and bilateral heels. The plantar fasciitis is better. Her rash is quite well-controlled. She denies any active lesions.  Activities of Daily Living:  Patient reports morning stiffness for 15 minutes.   Patient Reports nocturnal pain.  Difficulty dressing/grooming: Denies Difficulty climbing stairs: Reports Difficulty getting out of chair: Reports Difficulty using hands for taps, buttons, cutlery, and/or writing: Reports   Review of Systems  Constitutional: Positive for fatigue. Negative for night sweats, weight gain, weight loss and weakness.  HENT: Negative for mouth sores, trouble swallowing, trouble swallowing, mouth dryness and nose dryness.   Eyes: Negative for pain, redness, visual disturbance and dryness.  Respiratory: Negative for cough, shortness of breath and difficulty breathing.   Cardiovascular: Negative for chest pain, palpitations, hypertension, irregular heartbeat and swelling in legs/feet.  Gastrointestinal: Negative for blood in stool, constipation and diarrhea.  Endocrine: Negative for increased urination.  Genitourinary: Negative for vaginal dryness.  Musculoskeletal: Positive for arthralgias, joint pain and morning stiffness. Negative for joint swelling, myalgias, muscle weakness, muscle tenderness and myalgias.  Skin: Negative for color change,  rash, hair loss, skin tightness, ulcers and sensitivity to sunlight.  Allergic/Immunologic: Negative for susceptible to infections.  Neurological: Negative for dizziness, memory loss and night sweats.  Hematological: Negative for swollen glands.  Psychiatric/Behavioral: Positive for sleep disturbance. Negative for depressed mood. The patient is not nervous/anxious.     PMFS History:  Patient Active Problem List   Diagnosis Date Noted  . DJD (degenerative joint disease), cervical 02/03/2017  . Spondylosis of lumbar region without myelopathy or radiculopathy 02/03/2017  . High risk medication use 10/06/2016  . Plantar fasciitis 10/06/2016  . Achilles tendinitis of both lower extremities 10/06/2016  . Vitamin D deficiency 10/06/2016  . Primary osteoarthritis of both knees 10/06/2016  . Primary osteoarthritis of both feet 10/06/2016  . Chronic low back pain without sciatica 10/06/2016  . Hypothyroidism 10/06/2016  . Polyp of colon 10/06/2016  . B12 deficiency 10/06/2016  . 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 Anal fissure 04/11/2012    Past Medical History:  Diagnosis Date  . Anal fissure 2004   Tx'd with diltiazem  gel by Dr. Kendrick Ranchim Davis  . Arthritis   . Bronchitis, acute 2008  . Seasonal allergies   . Thyroid disease   . URI 10/12/2007   Qualifier: Diagnosis of  By: Alwyn RenHopper MD, Barbee ShropshireWilliam      Family History  Problem Relation Age of Onset  . Hypertension Mother   . Diabetes Mother    No past surgical history on file. Social History   Social History Narrative  . No narrative on file     Objective: Vital Signs: BP 120/74   Pulse 78   Resp 14   Ht  5\' 2"  (1.575 m)   Wt 154 lb (69.9 kg)   BMI 28.17 kg/m    Physical Exam  Constitutional: She is oriented to person, place, and time. She appears well-developed and well-nourished.  HENT:  Head: Normocephalic and atraumatic.  Eyes: Conjunctivae and EOM are normal.  Neck: Normal range of motion.  Cardiovascular:  Normal rate, regular rhythm, normal heart sounds and intact distal pulses.   Pulmonary/Chest: Effort normal and breath sounds normal.  Abdominal: Soft. Bowel sounds are normal.  Lymphadenopathy:    She has no cervical adenopathy.  Neurological: She is alert and oriented to person, place, and time.  Skin: Skin is warm and dry. Capillary refill takes less than 2 seconds.  Psychiatric: She has a normal mood and affect. Her behavior is normal.  Nursing note and vitals reviewed.    Musculoskeletal Exam: C-spine and thoracic lumbar spine good range of motion. She has tenderness on palpation over bilateral SI joints. Shoulder joints elbow joints wrist joint MCPs PIPs DIPs with good range of motion with no synovitis there was some tenderness across on palpation of the wrist joints and MCP joints. Hip joints knee joints ankle joints MTPs PIPs with good range of motion with no synovitis. There was discomfort with range of motion of her knee joints and tenderness over Achilles tendon area but no tendinitis was noted.  CDAI Exam: CDAI Homunculus Exam:   Tenderness:  RUE: wrist LUE: wrist Right hand: 2nd MCP and 3rd MCP Left hand: 2nd MCP and 3rd MCP RLE: tibiofemoral and tibiotalar LLE: tibiofemoral and tibiotalar  Joint Counts:  CDAI Tender Joint count: 8 CDAI Swollen Joint count: 0  Global Assessments:  Patient Global Assessment: 7 Provider Global Assessment: 4  CDAI Calculated Score: 19    Investigation: No additional findings.  Orders Only on 11/10/2016  Component Date Value Ref Range Status  . Interferon Gamma Release Assay 11/10/2016 NEGATIVE  NEGATIVE Final  . Quantiferon Nil Value 11/10/2016 0.02  IU/mL Final  . Mitogen-Nil 11/10/2016 >10.00  IU/mL Final  . Quantiferon Tb Ag Minus Nil Value 11/10/2016 0.00  IU/mL Final   Comment:   The Nil tube value is used to determine if the patient has a preexisting immune response which could cause a false-positive reading on the  test. In order for a test to be valid, the Nil tube must have a value of less than or equal to 8.0 IU/mL.   The mitogen control tube is used to assure the patient has a healthy immune status and also serves as a control for correct blood handling and incubation. It is used to detect false-negative readings. The mitogen tube must have a gamma interferon value of greater than or equal to 0.5 IU/mL higher than the value of the Nil tube.   The TB antigen tube is coated with the M. tuberculosis specific antigens. For a test to be considered positive, the TB antigen tube value minus the Nil tube value must be greater than or equal to 0.35 IU/mL.   For additional information, please refer to http://education.questdiagnostics.com/faq/QFT (This link is being provided for informational/educational purposes only.)     Imaging: No results found.  Speciality Comments: No specialty comments available.    Procedures:  No procedures performed Allergies: Arava [leflunomide] and Methotrexate derivatives   Assessment / Plan:     Visit Diagnoses: Psoriatic arthropathy ([facility]) -Patient complains of increase arthralgias in multiple joints. I do not see any synovitis on examination. She's been having a lot of discomfort  in her bilateral SI joints Plan: MR SACRUM SI JOINTS WO CONTRAST. I will also schedule ultrasound of her bilateral hands to look for synovitis.  Primary osteoarthritis of both knees: Chronic pain  Primary osteoarthritis of both feet: Chronic pain  She is also having troubles with Achilles tendinitis and heel pain. I will schedule ultrasound to look for tenosynovitis.  High risk medication use -she is on Cosyntex along with Mobic. Her labs have been stable so far we will check her labs again today Plan: CBC with Differential/Platelet, COMPLETE METABOLIC PANEL WITH GFR  DJD (degenerative joint disease), cervical chronic pain  Vitamin D deficiency: On supplement  Spondylosis of  lumbar region without myelopathy or radiculopathy: Chronic pain  Plantar fasciitis: Symptoms have improved  Achilles tendinitis of both lower extremities  Sacroiliac pain - Plan: MR SACRUM SI JOINTS WO CONTRAST    Orders: Orders Placed This Encounter  Procedures  . MR SACRUM SI JOINTS WO CONTRAST   No orders of the defined types were placed in this encounter.   Face-to-face time spent with patient was 30 minutes. 50% of time was spent in counseling and coordination of care.  Follow-Up Instructions: Return in about 3 months (around 05/07/2017) for Psoriatic arthritis.   Pollyann Savoy, MD  Note - This record has been created using [employer].  Chart creation errors have been sought, but may not always  have been located. Such creation errors do not reflect on  the standard of medical care.

## 2017-02-05 ENCOUNTER — Ambulatory Visit: Payer: BLUE CROSS/BLUE SHIELD | Admitting: Rheumatology

## 2017-02-05 NOTE — Progress Notes (Signed)
wnl

## 2017-02-08 ENCOUNTER — Telehealth: Payer: Self-pay | Admitting: Radiology

## 2017-02-08 NOTE — Telephone Encounter (Signed)
-----   Message from Shaili Deveshwar, MD sent at 02/05/2017  1:31 PM EST ----- wnl 

## 2017-02-08 NOTE — Telephone Encounter (Signed)
I have called patient to advise labs are normal  

## 2017-02-08 NOTE — Telephone Encounter (Signed)
Dr Corliss Skainseveshwar wants her to have an US of her feet, will you call her to schedule? She did not get sch with check out.

## 2017-02-09 ENCOUNTER — Telehealth (INDEPENDENT_AMBULATORY_CARE_PROVIDER_SITE_OTHER): Payer: Self-pay | Admitting: *Deleted

## 2017-02-09 NOTE — Telephone Encounter (Signed)
Pt has appt with [facility] on Wed Mar 21 at 2p for MRI, pt is to arrive 15 mins early to register, lmtrc to pt for appt information

## 2017-02-10 ENCOUNTER — Telehealth (INDEPENDENT_AMBULATORY_CARE_PROVIDER_SITE_OTHER): Payer: Self-pay | Admitting: *Deleted

## 2017-02-10 NOTE — Telephone Encounter (Signed)
Patient is scheduled for her US of her feet on April 11 @ 2:45, that was the next available appt.  Thank you.

## 2017-02-10 NOTE — Telephone Encounter (Signed)
yes

## 2017-02-10 NOTE — Telephone Encounter (Signed)
Received a call from the patient's insurance company stating the patient would like to have her MRI of the pelvis without contrast [facility]. Patient has it schedule for 02/15/17 and it has been approved through insurance with order id number [id number]. Fax number for [facility] [employer] is [phone]. The only thing they need is the order for the MRI. Can we send a new order?

## 2017-02-11 NOTE — Telephone Encounter (Signed)
Pt aware of appt.

## 2017-02-12 ENCOUNTER — Telehealth: Payer: Self-pay | Admitting: Rheumatology

## 2017-02-12 ENCOUNTER — Other Ambulatory Visit (INDEPENDENT_AMBULATORY_CARE_PROVIDER_SITE_OTHER): Payer: Self-pay | Admitting: *Deleted

## 2017-02-12 DIAGNOSIS — L4050 Arthropathic psoriasis, unspecified: Secondary | ICD-10-CM

## 2017-02-12 NOTE — Telephone Encounter (Signed)
Carollee [facility]Shannon from [facility] needs an order for an MRI faxed to [phone].  Her MRI is Monday. CB#[phone].  Thank you.

## 2017-02-17 ENCOUNTER — Ambulatory Visit (HOSPITAL_COMMUNITY): Admission: RE | Admit: 2017-02-17 | Payer: BLUE CROSS/BLUE SHIELD | Source: Ambulatory Visit

## 2017-02-18 DIAGNOSIS — L4050 Arthropathic psoriasis, unspecified: Secondary | ICD-10-CM | POA: Diagnosis not present

## 2017-02-18 DIAGNOSIS — R102 Pelvic and perineal pain: Secondary | ICD-10-CM | POA: Diagnosis not present

## 2017-02-18 DIAGNOSIS — M2560 Stiffness of unspecified joint, not elsewhere classified: Secondary | ICD-10-CM | POA: Diagnosis not present

## 2017-02-19 ENCOUNTER — Telehealth: Payer: Self-pay | Admitting: *Deleted

## 2017-02-19 NOTE — Telephone Encounter (Signed)
Patient had contacted the office with question about an ultrasound that she thought she was supposed to have scheduled in the office and an MRI  that was schedule for her. According to last office note the plan is to do ultrasound of her bilateral hands to look for synovitis and Plan: MR SACRUM SI JOINTS WO CONTRAST.  Attempted to contact the patient and left message for patient to call the office.

## 2017-02-19 NOTE — Telephone Encounter (Signed)
Ok to sch earlier

## 2017-02-19 NOTE — Telephone Encounter (Signed)
Patient is scheduled for her US of her feet on April 11 @ 2:45, that was the next available appt per Dois DavenportSandra. Can you see if there is anywhere we can work her in ?

## 2017-02-22 ENCOUNTER — Other Ambulatory Visit: Payer: Self-pay | Admitting: Rheumatology

## 2017-02-22 DIAGNOSIS — L4050 Arthropathic psoriasis, unspecified: Secondary | ICD-10-CM

## 2017-02-22 NOTE — Telephone Encounter (Signed)
Last Visit:  02/04/17 Next Visit: 02/24/17 Labs; 02/04/17 WNL TB Gold: 11/10/16 Neg  Okay to refill Cosentyx?

## 2017-02-22 NOTE — Telephone Encounter (Signed)
ok 

## 2017-02-24 ENCOUNTER — Ambulatory Visit (INDEPENDENT_AMBULATORY_CARE_PROVIDER_SITE_OTHER): Payer: BLUE CROSS/BLUE SHIELD | Admitting: Rheumatology

## 2017-02-24 ENCOUNTER — Inpatient Hospital Stay (INDEPENDENT_AMBULATORY_CARE_PROVIDER_SITE_OTHER): Payer: Self-pay

## 2017-02-24 DIAGNOSIS — M79672 Pain in left foot: Secondary | ICD-10-CM | POA: Diagnosis not present

## 2017-02-24 DIAGNOSIS — M79671 Pain in right foot: Secondary | ICD-10-CM

## 2017-02-24 MED ORDER — OMEGA-3-ACID ETHYL ESTERS 1 G PO CAPS: 2.0000 g | ORAL_CAPSULE | Freq: Two times a day (BID) | ORAL | 5 refills | Status: DC

## 2017-02-24 NOTE — Progress Notes (Signed)
Wendy [name] has known history of psoriatic arthritis and psoriasis. She comes today to get ultrasound examination of bilateral feet due to ongoing discomfort in her plantar fascia and Achilles tendon. An ultrasound examination of bilateral plantar fascia and Achilles tendon was performed which did not show any tenosynovitis.  I also discussed the MRI results of the pelvis which did not show sacroiliitis.  We had detailed discussion regarding natural anti-inflammatories to try. I will call in a prescription for Lovaza 1 g by mouth twice a day total 60 tablets with 5 refills will be given. I also discussed the option of Cymbalta but she declined. Pollyann SavoyShaili Akshath Mccarey, MD

## 2017-03-10 ENCOUNTER — Ambulatory Visit: Payer: BLUE CROSS/BLUE SHIELD | Admitting: Rheumatology

## 2017-04-19 DIAGNOSIS — J3089 Other allergic rhinitis: Secondary | ICD-10-CM | POA: Diagnosis not present

## 2017-04-19 DIAGNOSIS — J301 Allergic rhinitis due to pollen: Secondary | ICD-10-CM | POA: Diagnosis not present

## 2017-04-19 DIAGNOSIS — J3081 Allergic rhinitis due to animal (cat) (dog) hair and dander: Secondary | ICD-10-CM | POA: Diagnosis not present

## 2017-04-19 DIAGNOSIS — T783XXA Angioneurotic edema, initial encounter: Secondary | ICD-10-CM | POA: Diagnosis not present

## 2017-05-12 ENCOUNTER — Other Ambulatory Visit: Payer: Self-pay | Admitting: Radiology

## 2017-05-12 MED ORDER — CELECOXIB 200 MG PO CAPS: 200.0000 mg | ORAL_CAPSULE | Freq: Every day | ORAL | 0 refills | Status: DC

## 2017-05-12 NOTE — Telephone Encounter (Signed)
Patient called and spoke to Dr Deveshwar yesterday evening. She has increaCorliss Skainssed foot pain. Dr Wendy Skainseveshwar has asked me to send in a month supply of Celebrex for her. I called patient to let her know [facility] [address]

## 2017-05-24 ENCOUNTER — Telehealth: Payer: Self-pay | Admitting: Rheumatology

## 2017-05-24 ENCOUNTER — Other Ambulatory Visit: Payer: Self-pay | Admitting: Rheumatology

## 2017-05-24 DIAGNOSIS — L4050 Arthropathic psoriasis, unspecified: Secondary | ICD-10-CM

## 2017-05-24 NOTE — Telephone Encounter (Signed)
Patient called in regards to a refill on her Cosentyx.  She wants to make the pharmacy has a prescription for her refills and also wants to make sure she will have syringes for her.

## 2017-05-24 NOTE — Telephone Encounter (Signed)
Patient advised Cosentyx has been sent to the pharmacy. Patient is leaving the country and wanted to make sure she would have her medication before leaving. Patient will come to the office on Wednesday to update labs before leaving.

## 2017-05-24 NOTE — Telephone Encounter (Signed)
Okay to refill per Dr. Deveshwar.  

## 2017-05-24 NOTE — Telephone Encounter (Signed)
Last Visit: 02/24/17 Next Visit: 07/08/17 Labs: 02/04/17 WNL TB Gold: 02/04/17 Neg   Okay to refill Cosentyx?

## 2017-05-27 ENCOUNTER — Other Ambulatory Visit: Payer: Self-pay

## 2017-05-27 DIAGNOSIS — Z79899 Other long term (current) drug therapy: Secondary | ICD-10-CM | POA: Diagnosis not present

## 2017-05-27 LAB — CBC WITH DIFFERENTIAL/PLATELET
Basophils Absolute: 60 cells/uL (ref 0–200)
Basophils Relative: 1 %
EOS PCT: 6 %
Eosinophils Absolute: 360 cells/uL (ref 15–500)
HCT: 38.5 % (ref 35.0–45.0)
HEMOGLOBIN: 12.6 g/dL (ref 11.7–15.5)
LYMPHS ABS: 1920 {cells}/uL (ref 850–3900)
Lymphocytes Relative: 32 %
MCH: 28.4 pg (ref 27.0–33.0)
MCHC: 32.7 g/dL (ref 32.0–36.0)
MCV: 86.7 fL (ref 80.0–100.0)
MONOS PCT: 6 %
MPV: 12.2 fL (ref 7.5–12.5)
Monocytes Absolute: 360 cells/uL (ref 200–950)
NEUTROS ABS: 3300 {cells}/uL (ref 1500–7800)
NEUTROS PCT: 55 %
PLATELETS: 171 10*3/uL (ref 140–400)
RBC: 4.44 MIL/uL (ref 3.80–5.10)
RDW: 14.2 % (ref 11.0–15.0)
WBC: 6 10*3/uL (ref 3.8–10.8)

## 2017-05-28 LAB — COMPLETE METABOLIC PANEL WITH GFR
ALBUMIN: 3.9 g/dL (ref 3.6–5.1)
ALK PHOS: 77 U/L (ref 33–130)
ALT: 34 U/L — ABNORMAL HIGH (ref 6–29)
AST: 28 U/L (ref 10–35)
BUN: 13 mg/dL (ref 7–25)
CO2: 22 mmol/L (ref 20–31)
Calcium: 8.9 mg/dL (ref 8.6–10.4)
Chloride: 105 mmol/L (ref 98–110)
Creat: 0.77 mg/dL (ref 0.50–1.05)
GFR, Est African American: >89 mL/min (ref >=60)
GFR, Est Non African American: >89 mL/min (ref >=60)
GLUCOSE: 92 mg/dL (ref 65–99)
POTASSIUM: 4.2 mmol/L (ref 3.5–5.3)
SODIUM: 138 mmol/L (ref 135–146)
Total Bilirubin: 0.4 mg/dL (ref 0.2–1.2)
Total Protein: 6.9 g/dL (ref 6.1–8.1)

## 2017-05-28 NOTE — Progress Notes (Signed)
ALT mildly elevated, rest of the labs are normal.

## 2017-07-05 DIAGNOSIS — L409 Psoriasis, unspecified: Secondary | ICD-10-CM | POA: Insufficient documentation

## 2017-07-05 NOTE — Progress Notes (Deleted)
Office Visit Note  [name]             Date of Birth: Dec 18, 1965           MRN: [id number]             PCP: Rodrigo Ran, MD Referring: Rodrigo Ran, MD Visit Date: 07/08/2017 Occupation: @GUAROCC @    Subjective:  No chief complaint on file.   History of Present Illness: Wendy [name] is a 51 y.o. female ***   Activities of Daily Living:  Patient reports morning stiffness for *** {minute/hour:19697}.   Patient {ACTIONS;DENIES/REPORTS:21021675::"Denies"} nocturnal pain.  Difficulty dressing/grooming: {ACTIONS;DENIES/REPORTS:21021675::"Denies"} Difficulty climbing stairs: {ACTIONS;DENIES/REPORTS:21021675::"Denies"} Difficulty getting out of chair: {ACTIONS;DENIES/REPORTS:21021675::"Denies"} Difficulty using hands for taps, buttons, cutlery, and/or writing: {ACTIONS;DENIES/REPORTS:21021675::"Denies"}   No Rheumatology ROS completed.   PMFS History:  Patient Active Problem List   Diagnosis Date Noted  . Psoriasis 07/05/2017  . DJD (degenerative joint disease), cervical 02/03/2017  . Spondylosis of lumbar region without myelopathy or radiculopathy 02/03/2017  . High risk medication use 10/06/2016  . Plantar fasciitis 10/06/2016  . Achilles tendinitis of both lower extremities 10/06/2016  . Vitamin D deficiency 10/06/2016  . Primary osteoarthritis of both knees 10/06/2016  . Primary osteoarthritis of both feet 10/06/2016  . Chronic low back pain without sciatica 10/06/2016  . Hypothyroidism 10/06/2016  . Polyp of colon 10/06/2016  . B12 deficiency 10/06/2016  . 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 Anal fissure 04/11/2012    Past Medical History:  Diagnosis Date  . Anal fissure 2004   Tx'd with diltiazem  gel by Dr. Kendrick Ranch  . Arthritis   . Bronchitis, acute 2008  . Seasonal allergies   . Thyroid disease   . URI 10/12/2007   Qualifier: Diagnosis of  By: Alwyn Ren MD, Barbee Shropshire History  Problem Relation Age of Onset  . Hypertension  Mother   . Diabetes Mother    No past surgical history on file. Social History   Social History Narrative  . No narrative on file     Objective: Vital Signs: There were no vitals taken for this visit.   Physical Exam   Musculoskeletal Exam: ***  CDAI Exam: No CDAI exam completed.    Investigation: Findings:  11/10/2016 negative TB gold   CBC Latest Ref Rng & Units 05/27/2017 02/04/2017 10/08/2016  WBC 3.8 - 10.8 K/uL 6.0 6.9 5.5  Hemoglobin 11.7 - 15.5 g/dL 40.9 81.1 91.4  Hematocrit 35.0 - 45.0 % 38.5 38.3 41.3  Platelets 140 - 400 K/uL 171 174 180    CMP Latest Ref Rng & Units 05/27/2017 02/04/2017 10/08/2016  Glucose 65 - 99 mg/dL 92 91 85  BUN 7 - 25 mg/dL 13 12 13   Creatinine 0.50 - 1.05 mg/dL 7.82 9.56 2.13  Sodium 135 - 146 mmol/L 138 138 137  Potassium 3.5 - 5.3 mmol/L 4.2 3.7 4.2  Chloride 98 - 110 mmol/L 105 105 104  CO2 20 - 31 mmol/L 22 24 24   Calcium 8.6 - 10.4 mg/dL 8.9 8.8 9.0  Total Protein 6.1 - 8.1 g/dL 6.9 6.7 7.2  Total Bilirubin 0.2 - 1.2 mg/dL 0.4 0.3 0.7  Alkaline Phos 33 - 130 U/L 77 79 81  AST 10 - 35 U/L 28 20 27   ALT 6 - 29 U/L 34(H) 22 30(H)    Imaging: No results found.  Speciality Comments: No specialty comments available.    Procedures:  No procedures performed Allergies: Arava [leflunomide]  and Methotrexate derivatives   Assessment / Plan:     Visit Diagnoses: Psoriatic arthropathy (HCC)  Psoriasis  High risk medication use - Cosyntex, Celebrex  Sacroiliitis (HCC)  Plantar fasciitis  Achilles tendinitis of both lower extremities  Primary osteoarthritis of both knees  Primary osteoarthritis of both feet  DDD (degenerative disc disease), cervical  DDD (degenerative disc disease), lumbar  Vitamin D deficiency    Orders: No orders of the defined types were placed in this encounter.  No orders of the defined types were placed in this encounter.   Face-to-face time spent with patient was *** minutes. 50% of  time was spent in counseling and coordination of care.  Follow-Up Instructions: No Follow-up on file.   Pollyann SavoyShaili Malasha Kleppe, MD  Note - This record has been created using Animal nutrition[employer]Dragon software.  Chart creation errors have been sought, but may not always  have been located. Such creation errors do not reflect on  the standard of medical care.

## 2017-07-07 ENCOUNTER — Ambulatory Visit: Payer: BLUE CROSS/BLUE SHIELD | Admitting: Rheumatology

## 2017-07-08 ENCOUNTER — Ambulatory Visit: Payer: BLUE CROSS/BLUE SHIELD | Admitting: Rheumatology

## 2017-08-02 NOTE — Progress Notes (Signed)
Office Visit Note  [name]             Date of Birth: [birth year]           MRN: [id number]             PCP: Rodrigo Ran, MD Referring: Rodrigo Ran, MD Visit Date: 08/10/2017 Occupation: @GUAROCC @    Subjective:  Joint pain and stiffness.   History of Present Illness: Wendy [name] is a 51 y.o. female with history of psoriatic arthritis and osteoarthritis overlap. She continues to have pain and stiffness in multiple joints. She states her SI joints and bilateral knee joints have been painful. She also feel discomfort in her bilateral hands. Her plantar fasciitis and Achilles tendinitis is has improved. Although she feels some stiffness in the morning. She has not noticed any psoriasis lesions.  Activities of Daily Living:  Patient reports morning stiffness for 30  minutes.   Patient Reports nocturnal pain.  Difficulty dressing/grooming: Denies Difficulty climbing stairs: Reports Difficulty getting out of chair: Reports Difficulty using hands for taps, buttons, cutlery, and/or writing: Denies   Review of Systems  Constitutional: Positive for fatigue, weight gain and weakness. Negative for night sweats and weight loss.  HENT: Negative for mouth sores, trouble swallowing, trouble swallowing, mouth dryness and nose dryness.   Eyes: Negative for pain, redness, visual disturbance and dryness.  Respiratory: Negative.  Negative for cough, shortness of breath and difficulty breathing.   Cardiovascular: Negative.  Negative for chest pain, palpitations, hypertension, irregular heartbeat and swelling in legs/feet.  Gastrointestinal: Negative.  Negative for blood in stool, constipation and diarrhea.  Endocrine: Negative.  Negative for increased urination.  Genitourinary: Positive for menstrual problems. Negative for nocturia and vaginal dryness.  Musculoskeletal: Positive for arthralgias, joint pain and morning stiffness. Negative for joint swelling, myalgias, muscle weakness,  muscle tenderness and myalgias.  Skin: Positive for hair loss. Negative for color change, rash, skin tightness, ulcers and sensitivity to sunlight.  Allergic/Immunologic: Negative for susceptible to infections.  Neurological: Negative for dizziness, headaches, memory loss and night sweats.  Hematological: Negative.  Negative for swollen glands.  Psychiatric/Behavioral: Negative.  Negative for depressed mood and sleep disturbance. The patient is not nervous/anxious.     PMFS History:  Patient Active Problem List   Diagnosis Date Noted  . Psoriasis 07/05/2017  . DJD (degenerative joint disease), cervical 02/03/2017  . Spondylosis of lumbar region without myelopathy or radiculopathy 02/03/2017  . High risk medication use 10/06/2016  . Plantar fasciitis 10/06/2016  . Achilles tendinitis of both lower extremities 10/06/2016  . Vitamin D deficiency 10/06/2016  . Primary osteoarthritis of both knees 10/06/2016  . Primary osteoarthritis of both feet 10/06/2016  . Chronic low back pain without sciatica 10/06/2016  . Hypothyroidism 10/06/2016  . Polyp of colon 10/06/2016  . B12 deficiency 10/06/2016  . 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 Anal fissure 04/11/2012    Past Medical History:  Diagnosis Date  . Anal fissure 2004   Tx'd with diltiazem  gel by Dr. Kendrick Ranch  . Arthritis   . Bronchitis, acute 2008  . Seasonal allergies   . Thyroid disease   . URI 10/12/2007   Qualifier: Diagnosis of  By: Alwyn Ren MD, Barbee Shropshire History  Problem Relation Age of Onset  . Hypertension Mother   . Diabetes Mother    History reviewed. No pertinent surgical history. Social History   Social History Narrative  . No narrative on  file     Objective: Vital Signs: BP 102/65   Pulse 84   Resp 14   Ht 5\' 2"  (1.575 m)   Wt 153 lb (69.4 kg)   BMI 27.98 kg/m    Physical Exam  Constitutional: She is oriented to person, place, and time. She appears well-developed and well-nourished.   HENT:  Head: Normocephalic and atraumatic.  Eyes: Conjunctivae and EOM are normal.  Neck: Normal range of motion.  Cardiovascular: Normal rate, regular rhythm, normal heart sounds and intact distal pulses.   Pulmonary/Chest: Effort normal and breath sounds normal.  Abdominal: Soft. Bowel sounds are normal.  Lymphadenopathy:    She has no cervical adenopathy.  Neurological: She is alert and oriented to person, place, and time.  Skin: Skin is warm and dry. Capillary refill takes less than 2 seconds.  Psychiatric: She has a normal mood and affect. Her behavior is normal.  Nursing note and vitals reviewed.    Musculoskeletal Exam: C-spine and thoracic lumbar spine good range of motion. She has bilateral trapezius is spasm. She had tenderness over bilateral SI joints and gluteal region. She has tenderness over bilateral lateral epicondyle area and trochanteric area. Shoulder joints elbow joints wrist joint MCPs PIPs DIPs with good range of motion with no synovitis. She has mild osteoarthritic changes in her hands. Hip joints knee joints ankles MTPs PIPs with good range of motion with no synovitis. She has mild tenderness on palpation of her Achilles tendon bilaterally. Her LFTs were slightly elevated left eye last times I hope she's not taking Mobic and Celebrex both either and Celebrex 2 was just told  CDAI Exam: CDAI Homunculus Exam:   Joint Counts:  CDAI Tender Joint count: 0 CDAI Swollen Joint count: 0  Global Assessments:  Patient Global Assessment: 6 Provider Global Assessment: 4  CDAI Calculated Score: 10    Investigation: No additional findings.TB Gold: Negative in 10/2016 CBC Latest Ref Rng & Units 05/27/2017 02/04/2017 10/08/2016  WBC 3.8 - 10.8 K/uL 6.0 6.9 5.5  Hemoglobin 11.7 - 15.5 g/dL 16.112.6 09.612.5 04.513.1  Hematocrit 35.0 - 45.0 % 38.5 38.3 41.3  Platelets 140 - 400 K/uL 171 174 180   CMP Latest Ref Rng & Units 05/27/2017 02/04/2017 10/08/2016  Glucose 65 - 99 mg/dL 92 91 85    BUN 7 - 25 mg/dL 13 12 13   Creatinine 0.50 - 1.05 mg/dL 4.090.77 8.110.82 9.140.90  Sodium 135 - 146 mmol/L 138 138 137  Potassium 3.5 - 5.3 mmol/L 4.2 3.7 4.2  Chloride 98 - 110 mmol/L 105 105 104  CO2 20 - 31 mmol/L 22 24 24   Calcium 8.6 - 10.4 mg/dL 8.9 8.8 9.0  Total Protein 6.1 - 8.1 g/dL 6.9 6.7 7.2  Total Bilirubin 0.2 - 1.2 mg/dL 0.4 0.3 0.7  Alkaline Phos 33 - 130 U/L 77 79 81  AST 10 - 35 U/L 28 20 27   ALT 6 - 29 U/L 34(H) 22 30(H)    Imaging: No results found.  Speciality Comments: No specialty comments available.    Procedures:  No procedures performed Allergies: Arava [leflunomide] and Methotrexate derivatives   Assessment / Plan:     Psoriatic arthritis: She has no active synovitis on examination. She continues to have arthralgias. She does have some underlying osteoarthritis as well.  Psoriasis: She has no active lesions on examination today. She uses topical agents only. On when necessary basis.  High risk medication use - cosentyx 300 mg sq q week, Celebrex 200 mg po  qd -she is quite confused about the medications she is taking today. I have advised her to stay on Celebrex. She should not be taking Mobic with Celebrex. She was also advised not to take any other anti-inflammatories with Celebrex. She believes that she's not taking Amrix. She requested a muscle relaxer. I have given her a prescription for tizanidine 4 mg by mouth daily at bedtime as that may be helpful for her insomnia and muscle spasm. Plan: CBC with Differential/Platelet, COMPLETE METABOLIC PANEL WITH GFR today and then every 3 months. Tach) or you have any  Sacroiliac pain - MRI SIJ negative for sacroiliitis. She continues to have some SI joint pain and gluteal pain. I will refer her to physical therapy.  Primary osteoarthritis of both knees: Chronic pain I do not see any warmth or swelling on exam.  Primary osteoarthritis of both feet: She has no synovitis on exam.  Achilles tendinitis of both lower  extremities: Her ultrasound was negative.  Plantar fasciitis - 01/2017 [place] bilateral feet negative for achilles tendinitis or plantar fascitis.  DJD (degenerative joint disease), cervical - Plan: Ambulatory referral to Physical Therapy  Spondylosis of lumbar region without myelopathy or radiculopathy - Plan: Ambulatory referral to Physical Therapy  Vitamin D deficiency - on supplement   History of hypothyroidism  Weight gain: Weight loss diet and exercise was discussed at length.  Orders: Orders Placed This Encounter  Procedures  . Ambulatory referral to Physical Therapy   Meds ordered this encounter  Medications  . tiZANidine (ZANAFLEX) 4 MG tablet    Sig: Take 1 tablet (4 mg total) by mouth 3 times/day as needed-between meals & bedtime for muscle spasms.    Dispense:  30 tablet    Refill:  2      Follow-Up Instructions: Return in about 4 months (around 12/10/2017) for Psoriatic arthritis, Osteoarthritis,DDD.   Pollyann Savoy, MD  Note - This record has been created using Animal nutritionist.  Chart creation errors have been sought, but may not always  have been located. Such creation errors do not reflect on  the standard of medical care.

## 2017-08-10 ENCOUNTER — Ambulatory Visit (INDEPENDENT_AMBULATORY_CARE_PROVIDER_SITE_OTHER): Payer: BLUE CROSS/BLUE SHIELD | Admitting: Rheumatology

## 2017-08-10 ENCOUNTER — Encounter: Payer: Self-pay | Admitting: Rheumatology

## 2017-08-10 VITALS — BP 102/65 | HR 84 | Resp 14 | Ht 62.0 in | Wt 153.0 lb

## 2017-08-10 DIAGNOSIS — M47812 Spondylosis without myelopathy or radiculopathy, cervical region: Secondary | ICD-10-CM

## 2017-08-10 DIAGNOSIS — M170 Bilateral primary osteoarthritis of knee: Secondary | ICD-10-CM

## 2017-08-10 DIAGNOSIS — Z8639 Personal history of other endocrine, nutritional and metabolic disease: Secondary | ICD-10-CM | POA: Diagnosis not present

## 2017-08-10 DIAGNOSIS — M722 Plantar fascial fibromatosis: Secondary | ICD-10-CM | POA: Diagnosis not present

## 2017-08-10 DIAGNOSIS — M47816 Spondylosis without myelopathy or radiculopathy, lumbar region: Secondary | ICD-10-CM

## 2017-08-10 DIAGNOSIS — M19072 Primary osteoarthritis, left ankle and foot: Secondary | ICD-10-CM

## 2017-08-10 DIAGNOSIS — M7661 Achilles tendinitis, right leg: Secondary | ICD-10-CM | POA: Diagnosis not present

## 2017-08-10 DIAGNOSIS — L4050 Arthropathic psoriasis, unspecified: Secondary | ICD-10-CM | POA: Diagnosis not present

## 2017-08-10 DIAGNOSIS — M533 Sacrococcygeal disorders, not elsewhere classified: Secondary | ICD-10-CM | POA: Diagnosis not present

## 2017-08-10 DIAGNOSIS — M5030 Other cervical disc degeneration, unspecified cervical region: Secondary | ICD-10-CM

## 2017-08-10 DIAGNOSIS — Z79899 Other long term (current) drug therapy: Secondary | ICD-10-CM

## 2017-08-10 DIAGNOSIS — E559 Vitamin D deficiency, unspecified: Secondary | ICD-10-CM

## 2017-08-10 DIAGNOSIS — R635 Abnormal weight gain: Secondary | ICD-10-CM | POA: Diagnosis not present

## 2017-08-10 DIAGNOSIS — M19071 Primary osteoarthritis, right ankle and foot: Secondary | ICD-10-CM

## 2017-08-10 DIAGNOSIS — M7662 Achilles tendinitis, left leg: Secondary | ICD-10-CM

## 2017-08-10 MED ORDER — TIZANIDINE HCL 4 MG PO TABS: 4.0000 mg | ORAL_TABLET | Freq: Two times a day (BID) | ORAL | 2 refills | Status: DC | PRN

## 2017-08-10 NOTE — Progress Notes (Deleted)
Office Visit Note  [name]             Date of Birth: 1965/12/13           MRN: [id number]             PCP: Rodrigo Ran, MD Visit Date: 08/10/2017   Assessment: Visit Diagnoses: Psoriatic arthropathy (HCC)  High risk medication use - cosentyx 300 mg sq q week, Celebrex 200 mg po qd  Sacroiliac pain - MRI SIJ negative for sacroiliitis  Primary osteoarthritis of both knees  Primary osteoarthritis of both feet  Achilles tendinitis of both lower extremities  Plantar fasciitis - 01/2017 [place] bilateral feet negative for achilles tendinitis or plantar fascitis.  DJD (degenerative joint disease), cervical  Spondylosis of lumbar region without myelopathy or radiculopathy  Vitamin D deficiency - on supplement   History of hypothyroidism   Follow-Up Instructions: No Follow-up on file.  Orders: No orders of the defined types were placed in this encounter.  No orders of the defined types were placed in this encounter.    Subjective:    Allergies: Arava [leflunomide] and Methotrexate derivatives   Activities of Daily Living: ***   History of Present Illness: Wendy [name] is a 50 y.o. female ***   Review of Systems  Constitutional: Positive for fatigue and weakness.  HENT: Negative for mouth dryness.   Eyes: Negative for dryness.  Respiratory: Negative.  Negative for cough, shortness of breath and difficulty breathing.   Cardiovascular: Negative.  Negative for palpitations, hypertension and irregular heartbeat.  Gastrointestinal: Negative.  Negative for blood in stool, constipation and diarrhea.  Endocrine: Negative.   Genitourinary: Positive for nocturia.  Musculoskeletal: Positive for arthralgias, joint pain and joint swelling. Negative for myalgias, muscle weakness, morning stiffness and myalgias.  Skin: Negative.  Negative for color change, rash, hair loss, ulcers and sensitivity to sunlight.  Neurological: Positive for memory loss. Negative for  headaches.  Hematological: Negative.  Negative for swollen glands.  Psychiatric/Behavioral: Negative.  Negative for depressed mood and sleep disturbance. The patient is not nervous/anxious.      Investigation: No additional findings.   Objective: Vital Signs: BP 102/65   Pulse 84   Resp 14   Ht  (1.575 m)   Wt 153 lb (69.4 kg)   BMI 27.98 kg/m    Physical Exam   Musculoskeletal Exam: ***  CDAI Exam: No CDAI exam completed.  CDAI comments:  Speciality Comments: No specialty comments available.  Imaging: No results found.   PMFS History:  Patient Active Problem List   Diagnosis Date Noted  . Psoriasis 07/05/2017  . DJD (degenerative joint disease), cervical 02/03/2017  . Spondylosis of lumbar region without myelopathy or radiculopathy 02/03/2017  . High risk medication use 10/06/2016  . Plantar fasciitis 10/06/2016  . Achilles tendinitis of both lower extremities 10/06/2016  . Vitamin D deficiency 10/06/2016  . Primary osteoarthritis of both knees 10/06/2016  . Primary osteoarthritis of both feet 10/06/2016  . Chronic low back pain without sciatica 10/06/2016  . Hypothyroidism 10/06/2016  . Polyp of colon 10/06/2016  . B12 deficiency 10/06/2016  . 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 Anal fissure 04/11/2012    Past Medical History:  Diagnosis Date  . Anal fissure 2004   Tx'd with diltiazem  gel by Dr. Kendrick Ranch  . Arthritis   . Bronchitis, acute 2008  . Seasonal allergies   . Thyroid disease   . URI 10/12/2007   Qualifier: Diagnosis  of  By: Alwyn RenHopper MD, Barbee ShropshireWilliam      Family History  Problem Relation Age of Onset  . Hypertension Mother   . Diabetes Mother    History reviewed. No pertinent surgical history. Social History   Social History Narrative  . No narrative on file     Procedures:  No procedures performed  Mare LoanLawson, Ramona Ruark M, EMT  Note - This record has been created using [employer] software. Chart creation errors have been sought,  but may not always have been located. Such creation errors do not reflect on the standard of medical care.

## 2017-08-10 NOTE — Patient Instructions (Addendum)
  Do not use the Meloxicam, only the Celebrex. Do not take any other antiinflammatory medicines with it, such as Ibuprofen or Aleve.    Standing Labs We placed an order today for your standing lab work.    Please come back and get your standing labs in December and every 3 months  We have open lab Monday through Friday from 8:30-11:30 AM and 1:30-4 PM at the office of Dr. Pollyann SavoyShaili Jaisen Wiltrout.   The office is located at [address], [address], [place], [place] [address] No appointment is necessary.   Labs are drawn by [facility].  You may receive a bill from [facility] for your lab work. If you have any questions regarding directions or hours of operation,  please call [phone].

## 2017-08-11 ENCOUNTER — Other Ambulatory Visit: Payer: Self-pay | Admitting: Rheumatology

## 2017-08-11 DIAGNOSIS — L4050 Arthropathic psoriasis, unspecified: Secondary | ICD-10-CM

## 2017-08-11 LAB — CBC WITH DIFFERENTIAL/PLATELET
BASOS PCT: 0.9 %
Basophils Absolute: 69 cells/uL (ref 0–200)
EOS PCT: 4.4 %
Eosinophils Absolute: 339 cells/uL (ref 15–500)
HCT: 38.8 % (ref 35.0–45.0)
Hemoglobin: 12.9 g/dL (ref 11.7–15.5)
Lymphs Abs: 2441 cells/uL (ref 850–3900)
MCH: 28.2 pg (ref 27.0–33.0)
MCHC: 33.2 g/dL (ref 32.0–36.0)
MCV: 84.9 fL (ref 80.0–100.0)
MPV: 13.1 fL — AB (ref 7.5–12.5)
Monocytes Relative: 6.6 %
Neutro Abs: 4343 cells/uL (ref 1500–7800)
Neutrophils Relative %: 56.4 %
PLATELETS: 173 10*3/uL (ref 140–400)
RBC: 4.57 10*6/uL (ref 3.80–5.10)
RDW: 13 % (ref 11.0–15.0)
TOTAL LYMPHOCYTE: 31.7 %
WBC: 7.7 10*3/uL (ref 3.8–10.8)
WBCMIX: 508 {cells}/uL (ref 200–950)

## 2017-08-11 LAB — COMPLETE METABOLIC PANEL WITH GFR
AG Ratio: 1.4 (calc) (ref 1.0–2.5)
ALT: 27 U/L (ref 6–29)
AST: 22 U/L (ref 10–35)
Albumin: 4.2 g/dL (ref 3.6–5.1)
Alkaline phosphatase (APISO): 69 U/L (ref 33–130)
BUN: 13 mg/dL (ref 7–25)
CO2: 27 mmol/L (ref 20–32)
Calcium: 9.2 mg/dL (ref 8.6–10.4)
Chloride: 103 mmol/L (ref 98–110)
Creat: 0.91 mg/dL (ref 0.50–1.05)
GFR, EST NON AFRICAN AMERICAN: 73 mL/min/{1.73_m2} (ref >=60)
GFR, Est African American: 85 mL/min/{1.73_m2} (ref >=60)
GLUCOSE: 112 mg/dL — AB (ref 65–99)
Globulin: 3.1 g/dL (calc) (ref 1.9–3.7)
Potassium: 4 mmol/L (ref 3.5–5.3)
Sodium: 138 mmol/L (ref 135–146)
Total Bilirubin: 0.4 mg/dL (ref 0.2–1.2)
Total Protein: 7.3 g/dL (ref 6.1–8.1)

## 2017-08-11 NOTE — Telephone Encounter (Signed)
Pt was here yesterday, Ok to refill per Dr Corliss Skainseveshwar

## 2017-09-28 DIAGNOSIS — M79622 Pain in left upper arm: Secondary | ICD-10-CM | POA: Diagnosis not present

## 2017-09-28 DIAGNOSIS — Z803 Family history of malignant neoplasm of breast: Secondary | ICD-10-CM | POA: Diagnosis not present

## 2017-09-29 DIAGNOSIS — Z1231 Encounter for screening mammogram for malignant neoplasm of breast: Secondary | ICD-10-CM | POA: Diagnosis not present

## 2017-09-29 DIAGNOSIS — Z01419 Encounter for gynecological examination (general) (routine) without abnormal findings: Secondary | ICD-10-CM | POA: Diagnosis not present

## 2017-09-29 DIAGNOSIS — Z6829 Body mass index (BMI) 29.0-29.9, adult: Secondary | ICD-10-CM | POA: Diagnosis not present

## 2017-10-12 DIAGNOSIS — Z8041 Family history of malignant neoplasm of ovary: Secondary | ICD-10-CM | POA: Diagnosis not present

## 2017-10-12 DIAGNOSIS — N62 Hypertrophy of breast: Secondary | ICD-10-CM | POA: Diagnosis not present

## 2017-10-12 DIAGNOSIS — Z803 Family history of malignant neoplasm of breast: Secondary | ICD-10-CM | POA: Diagnosis not present

## 2017-10-25 ENCOUNTER — Other Ambulatory Visit: Payer: Self-pay | Admitting: Rheumatology

## 2017-10-25 DIAGNOSIS — L4050 Arthropathic psoriasis, unspecified: Secondary | ICD-10-CM

## 2017-10-25 NOTE — Telephone Encounter (Signed)
Last Visit: 08/10/17 Next Visit: 02/03/18 Labs: 08/10/17 WNL TB Gold: 11/10/16 Neg  Okay to refill per Dr. Corliss Skainseveshwar

## 2017-10-26 ENCOUNTER — Other Ambulatory Visit: Payer: Self-pay | Admitting: Rheumatology

## 2017-10-26 DIAGNOSIS — L4050 Arthropathic psoriasis, unspecified: Secondary | ICD-10-CM

## 2017-12-15 DIAGNOSIS — N62 Hypertrophy of breast: Secondary | ICD-10-CM | POA: Diagnosis not present

## 2017-12-15 HISTORY — PX: BREAST SURGERY: SHX581

## 2017-12-28 ENCOUNTER — Other Ambulatory Visit: Payer: Self-pay | Admitting: Rheumatology

## 2017-12-28 DIAGNOSIS — L4050 Arthropathic psoriasis, unspecified: Secondary | ICD-10-CM

## 2017-12-28 DIAGNOSIS — Z9225 Personal history of immunosupression therapy: Secondary | ICD-10-CM

## 2017-12-28 NOTE — Addendum Note (Signed)
Addended by: Henriette CombsHATTON, Meriem Lemieux L on: 12/28/2017 03:48 PM   Modules accepted: Orders

## 2017-12-28 NOTE — Telephone Encounter (Signed)
Last Visit: 08/10/17 Next Visit: 02/03/18 Labs: 08/10/17 WNL TB Gold: 11/10/16 Neg  Patient advised she is due to update labs. Patient will come in 12/29/17.   Okay to refill per Dr. Corliss Skainseveshwar

## 2017-12-29 ENCOUNTER — Other Ambulatory Visit: Payer: Self-pay | Admitting: *Deleted

## 2017-12-29 ENCOUNTER — Other Ambulatory Visit: Payer: Self-pay

## 2017-12-29 DIAGNOSIS — Z79899 Other long term (current) drug therapy: Secondary | ICD-10-CM

## 2017-12-29 DIAGNOSIS — Z9225 Personal history of immunosupression therapy: Secondary | ICD-10-CM

## 2017-12-30 NOTE — Progress Notes (Signed)
Advise MVI with iron

## 2017-12-31 LAB — COMPLETE METABOLIC PANEL WITH GFR
AG RATIO: 1.4 (calc) (ref 1.0–2.5)
ALT: 20 U/L (ref 6–29)
AST: 17 U/L (ref 10–35)
Albumin: 3.8 g/dL (ref 3.6–5.1)
Alkaline phosphatase (APISO): 81 U/L (ref 33–130)
BILIRUBIN TOTAL: 0.3 mg/dL (ref 0.2–1.2)
BUN: 12 mg/dL (ref 7–25)
CHLORIDE: 106 mmol/L (ref 98–110)
CO2: 30 mmol/L (ref 20–32)
Calcium: 9.3 mg/dL (ref 8.6–10.4)
Creat: 0.82 mg/dL (ref 0.50–1.05)
GFR, Est African American: 95 mL/min/{1.73_m2} (ref >=60)
GFR, Est Non African American: 82 mL/min/{1.73_m2} (ref >=60)
GLUCOSE: 113 mg/dL — AB (ref 65–99)
Globulin: 2.8 g/dL (calc) (ref 1.9–3.7)
POTASSIUM: 5 mmol/L (ref 3.5–5.3)
Sodium: 141 mmol/L (ref 135–146)
TOTAL PROTEIN: 6.6 g/dL (ref 6.1–8.1)

## 2017-12-31 LAB — CBC WITH DIFFERENTIAL/PLATELET
BASOS ABS: 78 {cells}/uL (ref 0–200)
BASOS PCT: 1.1 %
Eosinophils Absolute: 568 cells/uL — ABNORMAL HIGH (ref 15–500)
Eosinophils Relative: 8 %
HEMATOCRIT: 34.8 % — AB (ref 35.0–45.0)
HEMOGLOBIN: 11.4 g/dL — AB (ref 11.7–15.5)
LYMPHS ABS: 2038 {cells}/uL (ref 850–3900)
MCH: 27.7 pg (ref 27.0–33.0)
MCHC: 32.8 g/dL (ref 32.0–36.0)
MCV: 84.5 fL (ref 80.0–100.0)
MONOS PCT: 5.6 %
MPV: 11.9 fL (ref 7.5–12.5)
NEUTROS ABS: 4019 {cells}/uL (ref 1500–7800)
Neutrophils Relative %: 56.6 %
Platelets: 236 10*3/uL (ref 140–400)
RBC: 4.12 10*6/uL (ref 3.80–5.10)
RDW: 12.7 % (ref 11.0–15.0)
Total Lymphocyte: 28.7 %
WBC mixed population: 398 cells/uL (ref 200–950)
WBC: 7.1 10*3/uL (ref 3.8–10.8)

## 2017-12-31 LAB — QUANTIFERON-TB GOLD PLUS
Mitogen-NIL: >10 IU/mL
NIL: 0.04 IU/mL
QUANTIFERON-TB GOLD PLUS: NEGATIVE

## 2018-01-05 ENCOUNTER — Ambulatory Visit: Payer: BLUE CROSS/BLUE SHIELD | Admitting: Rheumatology

## 2018-01-05 DIAGNOSIS — E538 Deficiency of other specified B group vitamins: Secondary | ICD-10-CM | POA: Diagnosis not present

## 2018-01-05 DIAGNOSIS — Z0000 Encounter for general adult medical examination without abnormal findings: Secondary | ICD-10-CM | POA: Diagnosis not present

## 2018-01-05 DIAGNOSIS — E038 Other specified hypothyroidism: Secondary | ICD-10-CM | POA: Diagnosis not present

## 2018-01-05 DIAGNOSIS — E559 Vitamin D deficiency, unspecified: Secondary | ICD-10-CM | POA: Diagnosis not present

## 2018-01-05 DIAGNOSIS — R82998 Other abnormal findings in urine: Secondary | ICD-10-CM | POA: Diagnosis not present

## 2018-01-07 DIAGNOSIS — J029 Acute pharyngitis, unspecified: Secondary | ICD-10-CM | POA: Diagnosis not present

## 2018-01-11 DIAGNOSIS — Z0000 Encounter for general adult medical examination without abnormal findings: Secondary | ICD-10-CM | POA: Diagnosis not present

## 2018-01-11 DIAGNOSIS — Z1389 Encounter for screening for other disorder: Secondary | ICD-10-CM | POA: Diagnosis not present

## 2018-01-11 DIAGNOSIS — M542 Cervicalgia: Secondary | ICD-10-CM | POA: Diagnosis not present

## 2018-01-11 DIAGNOSIS — Z6827 Body mass index (BMI) 27.0-27.9, adult: Secondary | ICD-10-CM | POA: Diagnosis not present

## 2018-01-11 DIAGNOSIS — E559 Vitamin D deficiency, unspecified: Secondary | ICD-10-CM | POA: Diagnosis not present

## 2018-01-11 DIAGNOSIS — J029 Acute pharyngitis, unspecified: Secondary | ICD-10-CM | POA: Diagnosis not present

## 2018-01-11 DIAGNOSIS — M722 Plantar fascial fibromatosis: Secondary | ICD-10-CM | POA: Diagnosis not present

## 2018-01-20 NOTE — Progress Notes (Signed)
Office Visit Note  [name]             Date of Birth: 04-Mar-1966           MRN: [id number]             PCP: Rodrigo Ran, MD Referring: Rodrigo Ran, MD Visit Date: 02/03/2018 Occupation: @GUAROCC @    Subjective:  Medication Management. Increased joint pain.   History of Present Illness: Wendy [name] is a 52 y.o. female with history of psoriatic arthritis, psoriasis and osteoarthritis.  She had to come off Cosyntex for breast reduction surgery which was in January.  She resumed her Cosyntex about a week ago.  During that time she had increased joint pain especially in her bilateral knee joints.  She had mild psoriasis flare which is improving.  She is not having much discomfort in her SI joints currently.  She is having some discomfort in bilateral feet.  She also has some discomfort in the Achilles tendon.  Plantar fasciitis has improved.  Activities of Daily Living:  Patient reports morning stiffness for 5 minutes.   Patient Reports nocturnal pain.  Difficulty dressing/grooming: Denies Difficulty climbing stairs: Reports Difficulty getting out of chair: Denies Difficulty using hands for taps, buttons, cutlery, and/or writing: Reports   Review of Systems  Constitutional: Positive for fatigue. Negative for night sweats, weight gain, weight loss and weakness.  HENT: Positive for mouth dryness. Negative for mouth sores, trouble swallowing, trouble swallowing and nose dryness.   Eyes: Negative for pain, redness, visual disturbance and dryness.  Respiratory: Negative for cough, shortness of breath and difficulty breathing.   Cardiovascular: Negative for chest pain, palpitations, hypertension, irregular heartbeat and swelling in legs/feet.  Gastrointestinal: Negative for blood in stool, constipation and diarrhea.  Endocrine: Negative for increased urination.  Genitourinary: Negative for vaginal dryness.  Musculoskeletal: Positive for arthralgias, joint pain and morning  stiffness. Negative for joint swelling, myalgias, muscle weakness, muscle tenderness and myalgias.  Skin: Positive for rash. Negative for color change, hair loss, skin tightness, ulcers and sensitivity to sunlight.  Allergic/Immunologic: Negative for susceptible to infections.  Neurological: Negative for dizziness, memory loss and night sweats.  Hematological: Negative for swollen glands.  Psychiatric/Behavioral: Positive for sleep disturbance. Negative for depressed mood. The patient is not nervous/anxious.     PMFS History:  Patient Active Problem List   Diagnosis Date Noted  . Psoriasis 07/05/2017  . DJD (degenerative joint disease), cervical 02/03/2017  . Spondylosis of lumbar region without myelopathy or radiculopathy 02/03/2017  . High risk medication use 10/06/2016  . Plantar fasciitis 10/06/2016  . Achilles tendinitis of both lower extremities 10/06/2016  . Vitamin D deficiency 10/06/2016  . Primary osteoarthritis of both knees 10/06/2016  . Primary osteoarthritis of both feet 10/06/2016  . Chronic low back pain without sciatica 10/06/2016  . Hypothyroidism 10/06/2016  . Polyp of colon 10/06/2016  . B12 deficiency 10/06/2016  . 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 Anal fissure 04/11/2012    Past Medical History:  Diagnosis Date  . Anal fissure 2004   Tx'd with diltiazem  gel by Dr. Kendrick Ranch  . Arthritis   . Bronchitis, acute 2008  . Seasonal allergies   . Thyroid disease   . URI 10/12/2007   Qualifier: Diagnosis of  By: Alwyn Ren MD, Barbee Shropshire History  Problem Relation Age of Onset  . Hypertension Mother   . Diabetes Mother    Past Surgical History:  Procedure  Laterality Date  . BREAST SURGERY  12/15/2017   breast reduction    Social History   Social History Narrative  . Not on file     Objective: Vital Signs: BP 116/82 (BP Location: Left Arm, Patient Position: Sitting, Cuff Size: Normal)   Pulse 76   Resp 14   Ht 5\' 2"  (1.575 m)   Wt  153 lb 8 oz (69.6 kg)   BMI 28.08 kg/m    Physical Exam  Constitutional: She is oriented to person, place, and time. She appears well-developed and well-nourished.  HENT:  Head: Normocephalic and atraumatic.  Eyes: Conjunctivae and EOM are normal.  Neck: Normal range of motion.  Cardiovascular: Normal rate, regular rhythm, normal heart sounds and intact distal pulses.  Pulmonary/Chest: Effort normal and breath sounds normal.  Abdominal: Soft. Bowel sounds are normal.  Lymphadenopathy:    She has no cervical adenopathy.  Neurological: She is alert and oriented to person, place, and time.  Skin: Skin is warm and dry. Capillary refill takes less than 2 seconds.  Psychiatric: She has a normal mood and affect. Her behavior is normal.  Nursing note and vitals reviewed.    Musculoskeletal Exam: C-spine and thoracic spine good range of motion.  She is some discomfort range of motion of her lumbar spine.  No SI joint tenderness was elicited.  Shoulder joints elbow joints wrist joints with good range of motion.  There was no synovitis over MCPs PIPs or DIPs.  Hip joints and knee joints are good range of motion.  She has some warmth in her right knee joint.  Ankles and MTPs with good range of motion with no synovitis.  CDAI Exam: CDAI Homunculus Exam:   Tenderness:  RLE: tibiofemoral  Joint Counts:  CDAI Tender Joint count: 1 CDAI Swollen Joint count: 0  Global Assessments:  Patient Global Assessment: 5 Provider Global Assessment: 5  CDAI Calculated Score: 11    Investigation: No additional findings.TB Gold: 12/29/2017 Negative  CBC Latest Ref Rng & Units 12/29/2017 08/10/2017 05/27/2017  WBC 3.8 - 10.8 Thousand/uL 7.1 7.7 6.0  Hemoglobin 11.7 - 15.5 g/dL 11.4(L) 12.9 12.6  Hematocrit 35.0 - 45.0 % 34.8(L) 38.8 38.5  Platelets 140 - 400 Thousand/uL 236 173 171   CMP Latest Ref Rng & Units 12/29/2017 08/10/2017 05/27/2017  Glucose 65 - 99 mg/dL 454(U113(H) 981(X112(H) 92  BUN 7 - 25 mg/dL 12 13  13   Creatinine 0.50 - 1.05 mg/dL 9.140.82 7.820.91 9.560.77  Sodium 135 - 146 mmol/L 141 138 138  Potassium 3.5 - 5.3 mmol/L 5.0 4.0 4.2  Chloride 98 - 110 mmol/L 106 103 105  CO2 20 - 32 mmol/L 30 27 22   Calcium 8.6 - 10.4 mg/dL 9.3 9.2 8.9  Total Protein 6.1 - 8.1 g/dL 6.6 7.3 6.9  Total Bilirubin 0.2 - 1.2 mg/dL 0.3 0.4 0.4  Alkaline Phos 33 - 130 U/L - - 77  AST 10 - 35 U/L 17 22 28   ALT 6 - 29 U/L 20 27 34(H)    Imaging: No results found.  Speciality Comments: No specialty comments available.    Procedures:  No procedures performed Allergies: Pollen extract; Arava [leflunomide]; and Methotrexate derivatives   Assessment / Plan:     Visit Diagnoses: Psoriatic arthritis (HCC): She has no active synovitis on examination.  She is some warmth in her right knee joint.  She states she had mild flare of Cosyntex.  She was of Cosyntex for about 2 months for breast reduction surgery.  Psoriasis: She had a small patch on her scalp which is better now.  High risk medication use - cosentyx 300 mg sq q week, Celebrex 200 mg po qd - Plan: CBC with Differential/Platelet, COMPLETE METABOLIC PANEL WITH GFR.  Her labs have been stable.  We will continue to monitor labs every 3 months.  Other fatigue - Plan: TSH with her next labs.  Primary osteoarthritis of both knees: She has been having some increased discomfort recently.  Primary osteoarthritis of both feet: She is experiencing increased pain in her feet but had no active synovitis on examination.  Spondylosis of lumbar region without myelopathy or radiculopathy: She has chronic pain.  Core muscle strengthening exercises were discussed.  Sacroiliac pain - MRI SIJ negative for sacroiliitis: She had no SI joint tenderness on examination today.  DDD (degenerative disc disease), cervical: Doing better  Achilles tendinitis of both lower extremities - Her ultrasound was negative  Plantar fasciitis - 01/2017 [place] bilateral feet negative for achilles  tendinitis or plantar fascitis.  Currently not very active.  History of hypothyroidism  History of vitamin D deficiency - on supplement  - Plan: VITAMIN D 25 Hydroxy (Vit-D Deficiency, Fractures)    Orders: Orders Placed This Encounter  Procedures  . CBC with Differential/Platelet  . COMPLETE METABOLIC PANEL WITH GFR  . VITAMIN D 25 Hydroxy (Vit-D Deficiency, Fractures)  . TSH   No orders of the defined types were placed in this encounter.   Face-to-face time spent with patient was 30 minutes.  Greater than 50% of time was spent in counseling and coordination of care.  Follow-Up Instructions: Return in about 5 months (around 07/06/2018) for Psoriatic arthritis, Osteoarthritis.   Pollyann Savoy, MD  Note - This record has been created using Animal nutritionist.  Chart creation errors have been sought, but may not always  have been located. Such creation errors do not reflect on  the standard of medical care.

## 2018-02-03 ENCOUNTER — Encounter: Payer: Self-pay | Admitting: Rheumatology

## 2018-02-03 ENCOUNTER — Ambulatory Visit: Payer: BLUE CROSS/BLUE SHIELD | Admitting: Rheumatology

## 2018-02-03 VITALS — BP 116/82 | HR 76 | Resp 14 | Ht 62.0 in | Wt 153.5 lb

## 2018-02-03 DIAGNOSIS — M47816 Spondylosis without myelopathy or radiculopathy, lumbar region: Secondary | ICD-10-CM | POA: Diagnosis not present

## 2018-02-03 DIAGNOSIS — M5030 Other cervical disc degeneration, unspecified cervical region: Secondary | ICD-10-CM | POA: Diagnosis not present

## 2018-02-03 DIAGNOSIS — M722 Plantar fascial fibromatosis: Secondary | ICD-10-CM

## 2018-02-03 DIAGNOSIS — M19072 Primary osteoarthritis, left ankle and foot: Secondary | ICD-10-CM

## 2018-02-03 DIAGNOSIS — L4050 Arthropathic psoriasis, unspecified: Secondary | ICD-10-CM | POA: Diagnosis not present

## 2018-02-03 DIAGNOSIS — L409 Psoriasis, unspecified: Secondary | ICD-10-CM

## 2018-02-03 DIAGNOSIS — M7661 Achilles tendinitis, right leg: Secondary | ICD-10-CM

## 2018-02-03 DIAGNOSIS — Z8639 Personal history of other endocrine, nutritional and metabolic disease: Secondary | ICD-10-CM

## 2018-02-03 DIAGNOSIS — M533 Sacrococcygeal disorders, not elsewhere classified: Secondary | ICD-10-CM | POA: Diagnosis not present

## 2018-02-03 DIAGNOSIS — M19071 Primary osteoarthritis, right ankle and foot: Secondary | ICD-10-CM | POA: Diagnosis not present

## 2018-02-03 DIAGNOSIS — M7662 Achilles tendinitis, left leg: Secondary | ICD-10-CM

## 2018-02-03 DIAGNOSIS — M170 Bilateral primary osteoarthritis of knee: Secondary | ICD-10-CM | POA: Diagnosis not present

## 2018-02-03 DIAGNOSIS — R5383 Other fatigue: Secondary | ICD-10-CM

## 2018-02-03 DIAGNOSIS — Z79899 Other long term (current) drug therapy: Secondary | ICD-10-CM | POA: Diagnosis not present

## 2018-02-03 NOTE — Patient Instructions (Signed)
Standing Labs We placed an order today for your standing lab work.    Please come back and get your standing labs in May and every 3 months  We have open lab Monday through Friday from 8:30-11:30 AM and 1:30-4 PM at the office of Dr. Ellajane Stong.   The office is located at [address], [address], [place], [place] [address] No appointment is necessary.   Labs are drawn by [facility].  You may receive a bill from [facility] for your lab work. If you have any questions regarding directions or hours of operation,  please call [phone].    

## 2018-02-07 ENCOUNTER — Telehealth: Payer: Self-pay | Admitting: Neurology

## 2018-02-07 ENCOUNTER — Encounter: Payer: Self-pay | Admitting: Neurology

## 2018-02-07 ENCOUNTER — Institutional Professional Consult (permissible substitution): Payer: BLUE CROSS/BLUE SHIELD | Admitting: Neurology

## 2018-02-07 NOTE — Telephone Encounter (Signed)
PATIENT NO SHOWED TO APT TODAY.

## 2018-03-28 ENCOUNTER — Other Ambulatory Visit: Payer: Self-pay | Admitting: Rheumatology

## 2018-03-28 DIAGNOSIS — L4050 Arthropathic psoriasis, unspecified: Secondary | ICD-10-CM

## 2018-03-29 NOTE — Telephone Encounter (Addendum)
Last Visit: 02/03/18 Next Visit: 07/07/18 Labs: 12/29/17 anemia TB Gold: 12/29/17 neg   Patient advised she is due to update labs and will come in to update  Okay to refill 30 day supply per Dr. Corliss Skains

## 2018-04-01 ENCOUNTER — Other Ambulatory Visit: Payer: Self-pay

## 2018-04-01 DIAGNOSIS — R5383 Other fatigue: Secondary | ICD-10-CM

## 2018-04-01 DIAGNOSIS — Z8639 Personal history of other endocrine, nutritional and metabolic disease: Secondary | ICD-10-CM

## 2018-04-01 DIAGNOSIS — Z79899 Other long term (current) drug therapy: Secondary | ICD-10-CM | POA: Diagnosis not present

## 2018-04-01 LAB — TSH: TSH: 0.93 m[IU]/L

## 2018-04-02 LAB — COMPLETE METABOLIC PANEL WITH GFR
AG Ratio: 1.4 (calc) (ref 1.0–2.5)
ALKALINE PHOSPHATASE (APISO): 86 U/L (ref 33–130)
ALT: 28 U/L (ref 6–29)
AST: 25 U/L (ref 10–35)
Albumin: 4.3 g/dL (ref 3.6–5.1)
BILIRUBIN TOTAL: 0.4 mg/dL (ref 0.2–1.2)
BUN: 10 mg/dL (ref 7–25)
CHLORIDE: 104 mmol/L (ref 98–110)
CO2: 30 mmol/L (ref 20–32)
CREATININE: 0.83 mg/dL (ref 0.50–1.05)
Calcium: 9.8 mg/dL (ref 8.6–10.4)
GFR, Est African American: 94 mL/min/{1.73_m2} (ref >=60)
GFR, Est Non African American: 81 mL/min/{1.73_m2} (ref >=60)
GLUCOSE: 93 mg/dL (ref 65–99)
Globulin: 3.1 g/dL (calc) (ref 1.9–3.7)
Potassium: 4.1 mmol/L (ref 3.5–5.3)
Sodium: 141 mmol/L (ref 135–146)
TOTAL PROTEIN: 7.4 g/dL (ref 6.1–8.1)

## 2018-04-02 LAB — CBC WITH DIFFERENTIAL/PLATELET
BASOS PCT: 1.2 %
Basophils Absolute: 68 cells/uL (ref 0–200)
EOS PCT: 8.7 %
Eosinophils Absolute: 496 cells/uL (ref 15–500)
HCT: 37.2 % (ref 35.0–45.0)
Hemoglobin: 12.2 g/dL (ref 11.7–15.5)
Lymphs Abs: 2086 cells/uL (ref 850–3900)
MCH: 27.5 pg (ref 27.0–33.0)
MCHC: 32.8 g/dL (ref 32.0–36.0)
MCV: 83.8 fL (ref 80.0–100.0)
MONOS PCT: 6.9 %
MPV: 12.5 fL (ref 7.5–12.5)
Neutro Abs: 2656 cells/uL (ref 1500–7800)
Neutrophils Relative %: 46.6 %
PLATELETS: 183 10*3/uL (ref 140–400)
RBC: 4.44 10*6/uL (ref 3.80–5.10)
RDW: 13.2 % (ref 11.0–15.0)
TOTAL LYMPHOCYTE: 36.6 %
WBC mixed population: 393 cells/uL (ref 200–950)
WBC: 5.7 10*3/uL (ref 3.8–10.8)

## 2018-04-02 LAB — VITAMIN D 25 HYDROXY (VIT D DEFICIENCY, FRACTURES): Vit D, 25-Hydroxy: 27 ng/mL — ABNORMAL LOW (ref 30–100)

## 2018-04-04 ENCOUNTER — Telehealth: Payer: Self-pay | Admitting: *Deleted

## 2018-04-04 DIAGNOSIS — E559 Vitamin D deficiency, unspecified: Secondary | ICD-10-CM

## 2018-04-04 MED ORDER — VITAMIN D (ERGOCALCIFEROL) 1.25 MG (50000 UNIT) PO CAPS: 50000.0000 [IU] | ORAL_CAPSULE | ORAL | 0 refills | Status: DC

## 2018-04-04 NOTE — Telephone Encounter (Signed)
-----   Message from Gearldine Bienenstock, PA-C sent at 04/04/2018  8:20 AM EDT ----- Vitamin D is low.  Please send in Vitamin D 50,000 units by mouth once a week x 3 months.  We will recheck in 3 months.   All other labs are WNL.

## 2018-04-04 NOTE — Addendum Note (Signed)
Addended by: Henriette Combs on: 04/04/2018 11:24 AM   Modules accepted: Orders

## 2018-04-11 DIAGNOSIS — H10413 Chronic giant papillary conjunctivitis, bilateral: Secondary | ICD-10-CM | POA: Diagnosis not present

## 2018-04-20 DIAGNOSIS — J301 Allergic rhinitis due to pollen: Secondary | ICD-10-CM | POA: Diagnosis not present

## 2018-04-20 DIAGNOSIS — J3081 Allergic rhinitis due to animal (cat) (dog) hair and dander: Secondary | ICD-10-CM | POA: Diagnosis not present

## 2018-04-20 DIAGNOSIS — J3089 Other allergic rhinitis: Secondary | ICD-10-CM | POA: Diagnosis not present

## 2018-04-20 DIAGNOSIS — T783XXA Angioneurotic edema, initial encounter: Secondary | ICD-10-CM | POA: Diagnosis not present

## 2018-05-18 ENCOUNTER — Other Ambulatory Visit: Payer: Self-pay | Admitting: Rheumatology

## 2018-05-18 DIAGNOSIS — L4050 Arthropathic psoriasis, unspecified: Secondary | ICD-10-CM

## 2018-05-18 NOTE — Telephone Encounter (Signed)
Last Visit: 02/03/18 Next Visit: 07/07/18 Labs: 04/01/18 cbc/cmp wnl Tb Gold: 12/02/17 neg   Okay to refill per Dr. Corliss Skainseveshwar

## 2018-06-24 ENCOUNTER — Other Ambulatory Visit: Payer: Self-pay | Admitting: Rheumatology

## 2018-06-24 NOTE — Telephone Encounter (Signed)
Patient advised she will need to have labs before refill.

## 2018-06-24 NOTE — Progress Notes (Signed)
Office Visit Note  [name]             Date of Birth: 1966-11-29           MRN: [id number]             PCP: Rodrigo Ran, MD Referring: Rodrigo Ran, MD Visit Date: 07/07/2018 Occupation: @GUAROCC @  Subjective:  Medication Management   History of Present Illness: Wendy [name] is a 52 y.o. female history of psoriatic arthritis psoriasis and osteoarthritis.  She complains of some pain and stiffness in her bilateral knee joints without any joint swelling.  She also feels some weakness in her bilateral hands but denies any joint swelling.  Joints continue to hurt.  She is not having much problems with plantar fasciitis.  Continues to be stiff.  Activities of Daily Living:  Patient reports morning stiffness for 20 minutes.   Patient Reports nocturnal pain.  Difficulty dressing/grooming: Denies Difficulty climbing stairs: Reports Difficulty getting out of chair: Denies Difficulty using hands for taps, buttons, cutlery, and/or writing: Denies  Review of Systems  Constitutional: Positive for fatigue and weight gain. Negative for night sweats and weight loss.  HENT: Negative for mouth sores, trouble swallowing, trouble swallowing, mouth dryness and nose dryness.   Eyes: Positive for dryness. Negative for pain, redness and visual disturbance.  Respiratory: Negative for cough, shortness of breath and difficulty breathing.   Cardiovascular: Negative for chest pain, palpitations, hypertension, irregular heartbeat and swelling in legs/feet.  Gastrointestinal: Negative for blood in stool, constipation and diarrhea.  Endocrine: Negative for increased urination.  Genitourinary: Negative for vaginal dryness.  Musculoskeletal: Positive for arthralgias, joint pain and morning stiffness. Negative for joint swelling, myalgias, muscle weakness, muscle tenderness and myalgias.  Skin: Negative for color change, rash, hair loss, skin tightness, ulcers and sensitivity to sunlight.    Allergic/Immunologic: Negative for susceptible to infections.  Neurological: Negative for dizziness, memory loss, night sweats and weakness.  Hematological: Negative for swollen glands.  Psychiatric/Behavioral: Negative for depressed mood and sleep disturbance. The patient is not nervous/anxious.     PMFS History:  Patient Active Problem List   Diagnosis Date Noted  . Psoriasis 07/05/2017  . DJD (degenerative joint disease), cervical 02/03/2017  . Spondylosis of lumbar region without myelopathy or radiculopathy 02/03/2017  . High risk medication use 10/06/2016  . Plantar fasciitis 10/06/2016  . Achilles tendinitis of both lower extremities 10/06/2016  . Vitamin D deficiency 10/06/2016  . Primary osteoarthritis of both knees 10/06/2016  . Primary osteoarthritis of both feet 10/06/2016  . Chronic low back pain without sciatica 10/06/2016  . Hypothyroidism 10/06/2016  . Polyp of colon 10/06/2016  . B12 deficiency 10/06/2016  . 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 Anal fissure 04/11/2012    Past Medical History:  Diagnosis Date  . Anal fissure 2004   Tx'd with diltiazem  gel by Dr. Kendrick Ranch  . Arthritis   . Bronchitis, acute 2008  . Seasonal allergies   . Thyroid disease   . URI 10/12/2007   Qualifier: Diagnosis of  By: Alwyn Ren MD, Barbee Shropshire History  Problem Relation Age of Onset  . Hypertension Mother   . Diabetes Mother    Past Surgical History:  Procedure Laterality Date  . BREAST SURGERY  12/15/2017   breast reduction    Social History   Social History Narrative  . Not on file    Objective: Vital Signs: BP 116/79 (BP Location: Left Arm, Patient  Position: Sitting, Cuff Size: Normal)   Pulse 65   Resp 12   Ht 5\' 2"  (1.575 m)   Wt 154 lb 3.2 oz (69.9 kg)   BMI 28.20 kg/m    Physical Exam  Constitutional: She is oriented to person, place, and time. She appears well-developed and well-nourished.  HENT:  Head: Normocephalic and atraumatic.   Eyes: Conjunctivae and EOM are normal.  Neck: Normal range of motion.  Cardiovascular: Normal rate, regular rhythm, normal heart sounds and intact distal pulses.  Pulmonary/Chest: Effort normal and breath sounds normal.  Abdominal: Soft. Bowel sounds are normal.  Lymphadenopathy:    She has no cervical adenopathy.  Neurological: She is alert and oriented to person, place, and time.  Skin: Skin is warm and dry. Capillary refill takes less than 2 seconds.  Psychiatric: She has a normal mood and affect. Her behavior is normal.  Nursing note and vitals reviewed.    Musculoskeletal Exam: C-spine patient has some stiffness with range of motion.  Thoracic and lumbar spine good range of motion.  She has some tenderness over SI joints.  Shoulder joints elbow joints wrist joint MCPs PIPs DIPs were in good range of motion with no synovitis.  Hip joints knee joints ankles MTPs PIPs DIPs were in good range of motion with no synovitis.  She had discomfort range of motion of bilateral knee joints.  There was no plantar fasciitis or Achilles tendinitis on examination.  CDAI Exam: CDAI Homunculus Exam:   Tenderness:  LLE: tibiofemoral  Joint Counts:  CDAI Tender Joint count: 1 CDAI Swollen Joint count: 0  Global Assessments:  Patient Global Assessment: 5 Provider Global Assessment: 3  CDAI Calculated Score: 9   Investigation: No additional findings.  Imaging: Xr Foot 2 Views Left  Result Date: 07/07/2018 First MTP, PIP and DIP narrowing was noted.  No erosive changes were noted.  A calcaneal spur was noted. Impression: These findings are consistent with osteoarthritis of the foot.  Xr Foot 2 Views Right  Result Date: 07/07/2018 First MTP, PIP and DIP narrowing was noted.  No erosive changes were noted.  A calcaneal spur was noted. Impression: These findings are consistent with osteoarthritis of the foot.  Xr Hand 2 View Left  Result Date: 07/07/2018 No MCP PIP DIP intercarpal or  radiocarpal joint space narrowing was noted.  No erosive changes were noted. Impression: Unremarkable x-ray of the hand.  Xr Hand 2 View Right  Result Date: 07/07/2018 No MCP PIP DIP intercarpal or radiocarpal joint space narrowing was noted.  No erosive changes were noted. Impression: Unremarkable x-ray of the hand.  Xr Knee 3 View Left  Result Date: 07/07/2018 Moderate patellofemoral narrowing was noted.  Moderate medial compartment narrowing was noted.  No chondrocalcinosis was noted. Impression: These findings are consistent with moderate osteoarthritis and moderate chondromalacia patella.  Xr Knee 3 View Right  Result Date: 07/07/2018 Moderate patellofemoral narrowing was noted.  Moderate medial compartment narrowing was noted.  No chondrocalcinosis was noted. Impression: These findings are consistent with moderate osteoarthritis and moderate chondromalacia patella.   Recent Labs: Lab Results  Component Value Date   WBC 5.7 04/01/2018   HGB 12.2 04/01/2018   PLT 183 04/01/2018   NA 141 04/01/2018   K 4.1 04/01/2018   CL 104 04/01/2018   CO2 30 04/01/2018   GLUCOSE 93 04/01/2018   BUN 10 04/01/2018   CREATININE 0.83 04/01/2018   BILITOT 0.4 04/01/2018   ALKPHOS 77 05/27/2017   AST 25 04/01/2018  ALT 28 04/01/2018   PROT 7.4 04/01/2018   ALBUMIN 3.9 05/27/2017   CALCIUM 9.8 04/01/2018   GFRAA 94 04/01/2018   QFTBGOLDPLUS NEGATIVE 12/29/2017    Speciality Comments: No specialty comments available.  Procedures:  Large Joint Inj: L knee on 07/07/2018 3:18 PM Indications: pain Details: 27 G 1.5 in needle, medial approach  Arthrogram: No  Medications: 1.5 mL lidocaine 1 %; 40 mg triamcinolone acetonide 40 MG/ML Aspirate: 0 mL Outcome: tolerated well, no immediate complications Procedure, treatment alternatives, risks and benefits explained, specific risks discussed. Consent was given by the patient. Immediately prior to procedure a time out was called to verify the  correct patient, procedure, equipment, support staff and site/side marked as required. Patient was prepped and draped in the usual sterile fashion.     Allergies: Pollen extract; Arava [leflunomide]; and Methotrexate derivatives   Assessment / Plan:     Visit Diagnoses: Psoriatic arthritis (HCC)-she continues to have arthralgias but has no synovitis on examination.  Psoriasis-she has no active psoriasis lesions.  High risk medication use - cosentyx 300 mg subcu monthly- Plan: CBC with Differential/Platelet, COMPLETE METABOLIC PANEL WITH GFR today and then every 3 months to monitor for drug toxicity.  Other fatigue-she continues to have fatigue.  She also reports some weight gain.  She has been exercising on a regular basis.  Pain in both hands -she complains of increased pain in her bilateral hands.  No warmth swelling or effusion was noted.  Plan: XR Hand 2 View Right, XR Hand 2 View Left.  The x-ray of bilateral hands were unremarkable.  Chronic pain of both knees -she has been having discomfort in her bilateral knee joints.  She complains of increased pain in her left knee compared to right knee.  No warmth swelling or effusion was noted.  Plan: XR KNEE 3 VIEW RIGHT, XR KNEE 3 VIEW LEFT.  The x-ray of bilateral knee joint showed moderate osteoarthritis and moderate chondromalacia patella.  Primary osteoarthritis of both knees-she has been experiencing increased pain in her left knee joint.  We discussed different treatment options.  Indications side effects contraindications were reviewed.  She wanted to proceed with cortisone injection.  The procedure as described above.  She tolerated the procedure well.  Weight loss diet and exercises were discussed.  Pain in both feet -she has discomfort in her feet.  No synovitis was noted.  Plan: XR Foot 2 Views Right, XR Foot 2 Views Left.  The x-ray showed mild osteoarthritic changes.  Primary osteoarthritis of both feet  Spondylosis of lumbar  region without myelopathy or radiculopathy-she has chronic pain.  Sacroiliac pain - MRI SIJ negative for sacroiliitis.  DDD (degenerative disc disease), cervical-she continues to have some stiffness.  History of vitamin D deficiency -her vitamin D was low May 2019.  Plan: VITAMIN D 25 Hydroxy (Vit-D Deficiency, Fractures)  Plantar fasciitis - 01/2017 [place] bilateral feet negative for achilles tendinitis or plantar fascitis.  Her plantar fasciitis and Achilles tendinitis is improved since she has been on Cosentyx.  History of hypothyroidism   Orders: Orders Placed This Encounter  Procedures  . XR Hand 2 View Right  . XR Hand 2 View Left  . XR KNEE 3 VIEW RIGHT  . XR KNEE 3 VIEW LEFT  . XR Foot 2 Views Right  . XR Foot 2 Views Left  . CBC with Differential/Platelet  . COMPLETE METABOLIC PANEL WITH GFR  . VITAMIN D 25 Hydroxy (Vit-D Deficiency, Fractures)   No orders  of the defined types were placed in this encounter.   Face-to-face time spent with patient was 30 minutes. Greater than 50% of time was spent in counseling and coordination of care.  Follow-Up Instructions: Return in about 5 months (around 12/07/2018) for PsA, Ps, OA .   Pollyann Savoy, MD  Note - This record has been created using Animal nutritionist.  Chart creation errors have been sought, but may not always  have been located. Such creation errors do not reflect on  the standard of medical care.

## 2018-07-04 DIAGNOSIS — D485 Neoplasm of uncertain behavior of skin: Secondary | ICD-10-CM | POA: Diagnosis not present

## 2018-07-04 DIAGNOSIS — L218 Other seborrheic dermatitis: Secondary | ICD-10-CM | POA: Diagnosis not present

## 2018-07-07 ENCOUNTER — Ambulatory Visit: Payer: BLUE CROSS/BLUE SHIELD | Admitting: Rheumatology

## 2018-07-07 ENCOUNTER — Ambulatory Visit (INDEPENDENT_AMBULATORY_CARE_PROVIDER_SITE_OTHER): Payer: Self-pay

## 2018-07-07 ENCOUNTER — Encounter: Payer: Self-pay | Admitting: Rheumatology

## 2018-07-07 VITALS — BP 116/79 | HR 65 | Resp 12 | Ht 62.0 in | Wt 154.2 lb

## 2018-07-07 DIAGNOSIS — M25562 Pain in left knee: Secondary | ICD-10-CM

## 2018-07-07 DIAGNOSIS — L409 Psoriasis, unspecified: Secondary | ICD-10-CM

## 2018-07-07 DIAGNOSIS — G8929 Other chronic pain: Secondary | ICD-10-CM | POA: Diagnosis not present

## 2018-07-07 DIAGNOSIS — M79641 Pain in right hand: Secondary | ICD-10-CM

## 2018-07-07 DIAGNOSIS — R5383 Other fatigue: Secondary | ICD-10-CM

## 2018-07-07 DIAGNOSIS — M47816 Spondylosis without myelopathy or radiculopathy, lumbar region: Secondary | ICD-10-CM

## 2018-07-07 DIAGNOSIS — Z79899 Other long term (current) drug therapy: Secondary | ICD-10-CM | POA: Diagnosis not present

## 2018-07-07 DIAGNOSIS — M25561 Pain in right knee: Secondary | ICD-10-CM

## 2018-07-07 DIAGNOSIS — M19071 Primary osteoarthritis, right ankle and foot: Secondary | ICD-10-CM

## 2018-07-07 DIAGNOSIS — M170 Bilateral primary osteoarthritis of knee: Secondary | ICD-10-CM

## 2018-07-07 DIAGNOSIS — M79671 Pain in right foot: Secondary | ICD-10-CM

## 2018-07-07 DIAGNOSIS — M533 Sacrococcygeal disorders, not elsewhere classified: Secondary | ICD-10-CM

## 2018-07-07 DIAGNOSIS — M722 Plantar fascial fibromatosis: Secondary | ICD-10-CM

## 2018-07-07 DIAGNOSIS — M79642 Pain in left hand: Secondary | ICD-10-CM | POA: Diagnosis not present

## 2018-07-07 DIAGNOSIS — L4050 Arthropathic psoriasis, unspecified: Secondary | ICD-10-CM | POA: Diagnosis not present

## 2018-07-07 DIAGNOSIS — Z8639 Personal history of other endocrine, nutritional and metabolic disease: Secondary | ICD-10-CM

## 2018-07-07 DIAGNOSIS — M19072 Primary osteoarthritis, left ankle and foot: Secondary | ICD-10-CM

## 2018-07-07 DIAGNOSIS — M79672 Pain in left foot: Secondary | ICD-10-CM

## 2018-07-07 DIAGNOSIS — M5030 Other cervical disc degeneration, unspecified cervical region: Secondary | ICD-10-CM

## 2018-07-07 MED ORDER — TRIAMCINOLONE ACETONIDE 40 MG/ML IJ SUSP
40.0000 mg | INTRAMUSCULAR | Status: AC | PRN
  Administered 2018-07-07: 40 mg via INTRA_ARTICULAR

## 2018-07-07 MED ORDER — LIDOCAINE HCL 1 % IJ SOLN
1.5000 mL | INTRAMUSCULAR | Status: AC | PRN
  Administered 2018-07-07: 1.5 mL

## 2018-07-07 NOTE — Patient Instructions (Addendum)
Standing Labs We placed an order today for your standing lab work.    Please come back and get your standing labs in November and every 3 months   We have open lab Monday through Friday from 8:30-11:30 AM and 1:30-4:00 PM  at the office of Dr. Wilford Merryfield.   You may experience shorter wait times on Monday and Friday afternoons. The office is located at [address], [address], [place], [place] [address] No appointment is necessary.   Labs are drawn by [facility].  You may receive a bill from [facility] for your lab work. If you have any questions regarding directions or hours of operation,  please call [phone].     Knee Exercises Ask your health care provider which exercises are safe for you. Do exercises exactly as told by your health care provider and adjust them as directed. It is normal to feel mild stretching, pulling, tightness, or discomfort as you do these exercises, but you should stop right away if you feel sudden pain or your pain gets worse.Do not begin these exercises until told by your health care provider. STRETCHING AND RANGE OF MOTION EXERCISES These exercises warm up your muscles and joints and improve the movement and flexibility of your knee. These exercises also help to relieve pain, numbness, and tingling. Exercise A: Knee Extension, Prone 1. Lie on your abdomen on a bed. 2. Place your left / right knee just beyond the edge of the surface so your knee is not on the bed. You can put a towel under your left / right thigh just above your knee for comfort. 3. Relax your leg muscles and allow gravity to straighten your knee. You should feel a stretch behind your left / right knee. 4. Hold this position for __________ seconds. 5. Scoot up so your knee is supported between repetitions. Repeat __________ times. Complete this stretch __________ times a day. Exercise B: Knee Flexion, Active  1. Lie on your back with both knees straight. If this causes back discomfort,  bend your left / right knee so your foot is flat on the floor. 2. Slowly slide your left / right heel back toward your buttocks until you feel a gentle stretch in the front of your knee or thigh. 3. Hold this position for __________ seconds. 4. Slowly slide your left / right heel back to the starting position. Repeat __________ times. Complete this exercise __________ times a day. Exercise C: Quadriceps, Prone  1. Lie on your abdomen on a firm surface, such as a bed or padded floor. 2. Bend your left / right knee and hold your ankle. If you cannot reach your ankle or pant leg, loop a belt around your foot and grab the belt instead. 3. Gently pull your heel toward your buttocks. Your knee should not slide out to the side. You should feel a stretch in the front of your thigh and knee. 4. Hold this position for __________ seconds. Repeat __________ times. Complete this stretch __________ times a day. Exercise D: Hamstring, Supine 1. Lie on your back. 2. Loop a belt or towel over the ball of your left / right foot. The ball of your foot is on the walking surface, right under your toes. 3. Straighten your left / right knee and slowly pull on the belt to raise your leg until you feel a gentle stretch behind your knee. ? Do not let your left / right knee bend while you do this. ? Keep your other leg flat   on the floor. 4. Hold this position for __________ seconds. Repeat __________ times. Complete this stretch __________ times a day. STRENGTHENING EXERCISES These exercises build strength and endurance in your knee. Endurance is the ability to use your muscles for a long time, even after they get tired. Exercise E: Quadriceps, Isometric  1. Lie on your back with your left / right leg extended and your other knee bent. Put a rolled towel or small pillow under your knee if told by your health care provider. 2. Slowly tense the muscles in the front of your left / right thigh. You should see your kneecap  slide up toward your hip or see increased dimpling just above the knee. This motion will push the back of the knee toward the floor. 3. For __________ seconds, keep the muscle as tight as you can without increasing your pain. 4. Relax the muscles slowly and completely. Repeat __________ times. Complete this exercise __________ times a day. Exercise F: Straight Leg Raises - Quadriceps 1. Lie on your back with your left / right leg extended and your other knee bent. 2. Tense the muscles in the front of your left / right thigh. You should see your kneecap slide up or see increased dimpling just above the knee. Your thigh may even shake a bit. 3. Keep these muscles tight as you raise your leg 4-6 inches (10-15 cm) off the floor. Do not let your knee bend. 4. Hold this position for __________ seconds. 5. Keep these muscles tense as you lower your leg. 6. Relax your muscles slowly and completely after each repetition. Repeat __________ times. Complete this exercise __________ times a day. Exercise G: Hamstring, Isometric 1. Lie on your back on a firm surface. 2. Bend your left / right knee approximately __________ degrees. 3. Dig your left / right heel into the surface as if you are trying to pull it toward your buttocks. Tighten the muscles in the back of your thighs to dig as hard as you can without increasing any pain. 4. Hold this position for __________ seconds. 5. Release the tension gradually and allow your muscles to relax completely for __________ seconds after each repetition. Repeat __________ times. Complete this exercise __________ times a day. Exercise H: Hamstring Curls  If told by your health care provider, do this exercise while wearing ankle weights. Begin with __________ weights. Then increase the weight by 1 lb (0.5 kg) increments. Do not wear ankle weights that are more than __________. 1. Lie on your abdomen with your legs straight. 2. Bend your left / right knee as far as you  can without feeling pain. Keep your hips flat against the floor. 3. Hold this position for __________ seconds. 4. Slowly lower your leg to the starting position.  Repeat __________ times. Complete this exercise __________ times a day. Exercise I: Squats (Quadriceps) 1. Stand in front of a table, with your feet and knees pointing straight ahead. You may rest your hands on the table for balance but not for support. 2. Slowly bend your knees and lower your hips like you are going to sit in a chair. ? Keep your weight over your heels, not over your toes. ? Keep your lower legs upright so they are parallel with the table legs. ? Do not let your hips go lower than your knees. ? Do not bend lower than told by your health care provider. ? If your knee pain increases, do not bend as low. 3. Hold the squat position   for __________ seconds. 4. Slowly push with your legs to return to standing. Do not use your hands to pull yourself to standing. Repeat __________ times. Complete this exercise __________ times a day. Exercise J: Wall Slides (Quadriceps)  1. Lean your back against a smooth wall or door while you walk your feet out 18-24 inches (46-61 cm) from it. 2. Place your feet hip-width apart. 3. Slowly slide down the wall or door until your knees bend __________ degrees. Keep your knees over your heels, not over your toes. Keep your knees in line with your hips. 4. Hold for __________ seconds. Repeat __________ times. Complete this exercise __________ times a day. Exercise K: Straight Leg Raises - Hip Abductors 1. Lie on your side with your left / right leg in the top position. Lie so your head, shoulder, knee, and hip line up. You may bend your bottom knee to help you keep your balance. 2. Roll your hips slightly forward so your hips are stacked directly over each other and your left / right knee is facing forward. 3. Leading with your heel, lift your top leg 4-6 inches (10-15 cm). You should feel  the muscles in your outer hip lifting. ? Do not let your foot drift forward. ? Do not let your knee roll toward the ceiling. 4. Hold this position for __________ seconds. 5. Slowly return your leg to the starting position. 6. Let your muscles relax completely after each repetition. Repeat __________ times. Complete this exercise __________ times a day. Exercise L: Straight Leg Raises - Hip Extensors 1. Lie on your abdomen on a firm surface. You can put a pillow under your hips if that is more comfortable. 2. Tense the muscles in your buttocks and lift your left / right leg about 4-6 inches (10-15 cm). Keep your knee straight as you lift your leg. 3. Hold this position for __________ seconds. 4. Slowly lower your leg to the starting position. 5. Let your leg relax completely after each repetition. Repeat __________ times. Complete this exercise __________ times a day. This information is not intended to replace advice given to you by your health care provider. Make sure you discuss any questions you have with your health care provider. Document Released: 09/30/2005 Document Revised: 08/10/2016 Document Reviewed: 09/22/2015 Elsevier Interactive Patient Education  2018 [employer]    

## 2018-07-08 ENCOUNTER — Other Ambulatory Visit: Payer: Self-pay

## 2018-07-08 DIAGNOSIS — E559 Vitamin D deficiency, unspecified: Secondary | ICD-10-CM

## 2018-07-08 LAB — COMPLETE METABOLIC PANEL WITH GFR
AG Ratio: 1.3 (calc) (ref 1.0–2.5)
ALBUMIN MSPROF: 4.4 g/dL (ref 3.6–5.1)
ALT: 36 U/L — ABNORMAL HIGH (ref 6–29)
AST: 28 U/L (ref 10–35)
Alkaline phosphatase (APISO): 90 U/L (ref 33–130)
BILIRUBIN TOTAL: 0.5 mg/dL (ref 0.2–1.2)
BUN: 11 mg/dL (ref 7–25)
CO2: 29 mmol/L (ref 20–32)
CREATININE: 0.83 mg/dL (ref 0.50–1.05)
Calcium: 9.6 mg/dL (ref 8.6–10.4)
Chloride: 104 mmol/L (ref 98–110)
GFR, EST AFRICAN AMERICAN: 94 mL/min/{1.73_m2} (ref >=60)
GFR, Est Non African American: 81 mL/min/{1.73_m2} (ref >=60)
GLUCOSE: 102 mg/dL — AB (ref 65–99)
Globulin: 3.3 g/dL (calc) (ref 1.9–3.7)
Potassium: 4.2 mmol/L (ref 3.5–5.3)
SODIUM: 139 mmol/L (ref 135–146)
TOTAL PROTEIN: 7.7 g/dL (ref 6.1–8.1)

## 2018-07-08 LAB — CBC WITH DIFFERENTIAL/PLATELET
BASOS ABS: 57 {cells}/uL (ref 0–200)
BASOS PCT: 0.8 %
EOS ABS: 1065 {cells}/uL — AB (ref 15–500)
Eosinophils Relative: 15 %
HCT: 40.3 % (ref 35.0–45.0)
HEMOGLOBIN: 13.2 g/dL (ref 11.7–15.5)
LYMPHS ABS: 2151 {cells}/uL (ref 850–3900)
MCH: 28.1 pg (ref 27.0–33.0)
MCHC: 32.8 g/dL (ref 32.0–36.0)
MCV: 85.9 fL (ref 80.0–100.0)
MONOS PCT: 5.5 %
MPV: 12.4 fL (ref 7.5–12.5)
NEUTROS ABS: 3436 {cells}/uL (ref 1500–7800)
Neutrophils Relative %: 48.4 %
Platelets: 186 10*3/uL (ref 140–400)
RBC: 4.69 10*6/uL (ref 3.80–5.10)
RDW: 13.1 % (ref 11.0–15.0)
Total Lymphocyte: 30.3 %
WBC mixed population: 391 cells/uL (ref 200–950)
WBC: 7.1 10*3/uL (ref 3.8–10.8)

## 2018-07-08 LAB — VITAMIN D 25 HYDROXY (VIT D DEFICIENCY, FRACTURES): Vit D, 25-Hydroxy: 20 ng/mL — ABNORMAL LOW (ref 30–100)

## 2018-07-08 MED ORDER — VITAMIN D (ERGOCALCIFEROL) 1.25 MG (50000 UNIT) PO CAPS: 50000.0000 [IU] | ORAL_CAPSULE | ORAL | 0 refills | Status: DC

## 2018-07-08 NOTE — Progress Notes (Signed)
ALT is mildly elevated.  Please advise patient to avoid alcohol and NSAIDs.  It could be because of the antifungal medication she is taking.  She should be on vitamin D 50,000 units twice a week for 3 months then we will check her levels.

## 2018-08-12 NOTE — Progress Notes (Signed)
Office Visit Note  [name]             Date of Birth: [birth year]           MRN: [id number]             PCP: Rodrigo RanPerini, Mark, MD Referring: Rodrigo RanPerini, Mark, MD Visit Date: 08/15/2018 Occupation: @GUAROCC @  Subjective:  Right knee pain  History of Present Illness: Wendy [name] is a 52 y.o. female with history of psoriatic arthritis, osteoarthritis, and DDD.  Patient is on Cosentyx 300 mg sq injections every month.  Eyes any joint swelling.  She had good response to cortisone injection to her left knee.  Her right knee joint has been painful.  She continues to have some SI joint pain.  She has been using ice and heat which has been helpful.  Activities of Daily Living:  Patient reports morning stiffness for30  minutes.   Patient Reports nocturnal pain.  Difficulty dressing/grooming: Denies Difficulty climbing stairs: Reports Difficulty getting out of chair: Reports Difficulty using hands for taps, buttons, cutlery, and/or writing: Denies  Review of Systems  Constitutional: Positive for fatigue. Negative for night sweats, weight gain and weight loss.  HENT: Negative for mouth sores, trouble swallowing, trouble swallowing, mouth dryness and nose dryness.   Eyes: Negative for pain, redness, visual disturbance and dryness.  Respiratory: Negative for cough, hemoptysis, shortness of breath and difficulty breathing.   Cardiovascular: Negative for chest pain, palpitations, hypertension, irregular heartbeat and swelling in legs/feet.  Gastrointestinal: Negative for blood in stool, constipation and diarrhea.  Endocrine: Negative for increased urination.  Genitourinary: Negative for painful urination and vaginal dryness.  Musculoskeletal: Positive for arthralgias, joint pain and morning stiffness. Negative for joint swelling, myalgias, muscle weakness, muscle tenderness and myalgias.  Skin: Negative for color change, pallor, rash, hair loss, nodules/bumps, skin tightness, ulcers and  sensitivity to sunlight.  Allergic/Immunologic: Negative for susceptible to infections.  Neurological: Negative for dizziness, numbness, headaches, memory loss, night sweats and weakness.  Hematological: Negative for swollen glands.  Psychiatric/Behavioral: Negative for depressed mood and sleep disturbance. The patient is not nervous/anxious.     PMFS History:  Patient Active Problem List   Diagnosis Date Noted  . Psoriasis 07/05/2017  . DJD (degenerative joint disease), cervical 02/03/2017  . Spondylosis of lumbar region without myelopathy or radiculopathy 02/03/2017  . High risk medication use 10/06/2016  . Plantar fasciitis 10/06/2016  . Achilles tendinitis of both lower extremities 10/06/2016  . Vitamin D deficiency 10/06/2016  . Primary osteoarthritis of both knees 10/06/2016  . Primary osteoarthritis of both feet 10/06/2016  . Chronic low back pain without sciatica 10/06/2016  . Hypothyroidism 10/06/2016  . Polyp of colon 10/06/2016  . B12 deficiency 10/06/2016  . 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 Anal fissure 04/11/2012    Past Medical History:  Diagnosis Date  . Anal fissure 2004   Tx'd with diltiazem  gel by Dr. Kendrick Ranchim Davis  . Arthritis   . Bronchitis, acute 2008  . Seasonal allergies   . Thyroid disease   . URI 10/12/2007   Qualifier: Diagnosis of  By: Alwyn RenHopper MD, Barbee ShropshireWilliam      Family History  Problem Relation Age of Onset  . Hypertension Mother   . Diabetes Mother    Past Surgical History:  Procedure Laterality Date  . BREAST SURGERY  12/15/2017   breast reduction    Social History   Social History Narrative  . Not on file  Objective: Vital Signs: BP 127/88 (BP Location: Left Arm, Patient Position: Sitting, Cuff Size: Normal)   Pulse 67   Resp 13   Ht 5\' 2"  (1.575 m)   Wt 155 lb (70.3 kg)   BMI 28.35 kg/m    Physical Exam  Constitutional: She is oriented to person, place, and time. She appears well-developed and well-nourished.  HENT:    Head: Normocephalic and atraumatic.  Eyes: Conjunctivae and EOM are normal.  Neck: Normal range of motion.  Cardiovascular: Normal rate, regular rhythm, normal heart sounds and intact distal pulses.  Pulmonary/Chest: Effort normal and breath sounds normal.  Abdominal: Soft. Bowel sounds are normal.  Lymphadenopathy:    She has no cervical adenopathy.  Neurological: She is alert and oriented to person, place, and time.  Skin: Skin is warm and dry. Capillary refill takes less than 2 seconds.  Psychiatric: She has a normal mood and affect. Her behavior is normal.  Nursing note and vitals reviewed.    Musculoskeletal Exam: C-spine thoracic lumbar spine good range of motion.  She has some tenderness over SI joints.  Shoulder joints elbow joints wrist joint MCPs PIPs DIPs were in good range of motion with no synovitis.  Hip joints knee joints ankles MTPs PIPs were in good range of motion.  She has some discomfort range of motion of her right knee joint.  CDAI Exam: CDAI Score: Not documented Patient Global Assessment: Not documented; Provider Global Assessment: Not documented Swollen: Not documented; Tender: Not documented Joint Exam   Not documented   There is currently no information documented on the homunculus. Go to the Rheumatology activity and complete the homunculus joint exam.  Investigation: No additional findings.  Imaging: No results found.  Recent Labs: Lab Results  Component Value Date   WBC 7.1 07/07/2018   HGB 13.2 07/07/2018   PLT 186 07/07/2018   NA 139 07/07/2018   K 4.2 07/07/2018   CL 104 07/07/2018   CO2 29 07/07/2018   GLUCOSE 102 (H) 07/07/2018   BUN 11 07/07/2018   CREATININE 0.83 07/07/2018   BILITOT 0.5 07/07/2018   ALKPHOS 77 05/27/2017   AST 28 07/07/2018   ALT 36 (H) 07/07/2018   PROT 7.7 07/07/2018   ALBUMIN 3.9 05/27/2017   CALCIUM 9.6 07/07/2018   GFRAA 94 07/07/2018   QFTBGOLDPLUS NEGATIVE 12/29/2017    Speciality Comments: No  specialty comments available.  Procedures:  Large Joint Inj: R knee on 08/15/2018 11:41 AM Indications: pain Details: 27 G 1.5 in needle, medial approach  Arthrogram: No  Medications: 40 mg triamcinolone acetonide 40 MG/ML; 1.5 mL lidocaine 1 % Aspirate: 0 mL Outcome: tolerated well, no immediate complications Procedure, treatment alternatives, risks and benefits explained, specific risks discussed. Consent was given by the patient. Immediately prior to procedure a time out was called to verify the correct patient, procedure, equipment, support staff and site/side marked as required. Patient was prepped and draped in the usual sterile fashion.     Allergies: Pollen extract; Arava [leflunomide]; and Methotrexate derivatives   Assessment / Plan:     Visit Diagnoses: Psoriatic arthritis (HCC)-she continues to have some discomfort without any warmth swelling or effusion.  Psoriasis-she currently have no active lesions of psoriasis.  High risk medication use - cosentyx 300 mg subcu monthly.  Her labs are stable.  Primary osteoarthritis of both knees-she has been having increased pain and discomfort in her right knee joint.  Left knee joint had good response to cortisone injection.  She requests  cortisone injection to the right knee joint.  After informed consent was obtained right knee joint was injected with cortisone as described above.  We discussed that we may consider Visco supplement injections if she has an adequate response to cortisone injection.  Weight loss diet and exercise was also discussed.  Primary osteoarthritis of both feet  Other fatigue  Spondylosis of lumbar region without myelopathy or radiculopathy  Sacroiliac pain - MRI SIJ negative for sacroiliitis.  She continues to have some SI joint discomfort.  DDD (degenerative disc disease), cervical  Plantar fasciitis-doing better  History of hypothyroidism  History of vitamin D deficiency   Orders: No orders of  the defined types were placed in this encounter.  No orders of the defined types were placed in this encounter.    Follow-Up Instructions: Return for Psoriatic arthritis, Osteoarthritis, DDD.   Pollyann Savoy, MD  Note - This record has been created using Animal nutritionist.  Chart creation errors have been sought, but may not always  have been located. Such creation errors do not reflect on  the standard of medical care.

## 2018-08-15 ENCOUNTER — Encounter: Payer: Self-pay | Admitting: Rheumatology

## 2018-08-15 ENCOUNTER — Ambulatory Visit: Payer: BLUE CROSS/BLUE SHIELD | Admitting: Rheumatology

## 2018-08-15 VITALS — BP 127/88 | HR 67 | Resp 13 | Ht 62.0 in | Wt 155.0 lb

## 2018-08-15 DIAGNOSIS — M533 Sacrococcygeal disorders, not elsewhere classified: Secondary | ICD-10-CM

## 2018-08-15 DIAGNOSIS — M19072 Primary osteoarthritis, left ankle and foot: Secondary | ICD-10-CM

## 2018-08-15 DIAGNOSIS — Z8639 Personal history of other endocrine, nutritional and metabolic disease: Secondary | ICD-10-CM

## 2018-08-15 DIAGNOSIS — M47816 Spondylosis without myelopathy or radiculopathy, lumbar region: Secondary | ICD-10-CM

## 2018-08-15 DIAGNOSIS — L4050 Arthropathic psoriasis, unspecified: Secondary | ICD-10-CM

## 2018-08-15 DIAGNOSIS — R5383 Other fatigue: Secondary | ICD-10-CM

## 2018-08-15 DIAGNOSIS — M170 Bilateral primary osteoarthritis of knee: Secondary | ICD-10-CM | POA: Diagnosis not present

## 2018-08-15 DIAGNOSIS — M722 Plantar fascial fibromatosis: Secondary | ICD-10-CM

## 2018-08-15 DIAGNOSIS — L409 Psoriasis, unspecified: Secondary | ICD-10-CM

## 2018-08-15 DIAGNOSIS — Z79899 Other long term (current) drug therapy: Secondary | ICD-10-CM | POA: Diagnosis not present

## 2018-08-15 DIAGNOSIS — M5030 Other cervical disc degeneration, unspecified cervical region: Secondary | ICD-10-CM

## 2018-08-15 DIAGNOSIS — M19071 Primary osteoarthritis, right ankle and foot: Secondary | ICD-10-CM

## 2018-08-15 MED ORDER — LIDOCAINE HCL 1 % IJ SOLN
1.5000 mL | INTRAMUSCULAR | Status: AC | PRN
  Administered 2018-08-15: 1.5 mL

## 2018-08-15 MED ORDER — TRIAMCINOLONE ACETONIDE 40 MG/ML IJ SUSP
40.0000 mg | INTRAMUSCULAR | Status: AC | PRN
  Administered 2018-08-15: 40 mg via INTRA_ARTICULAR

## 2018-08-15 NOTE — Patient Instructions (Signed)
Standing Labs We placed an order today for your standing lab work.    Please come back and get your standing labs in November and every 3 months   We have open lab Monday through Friday from 8:30-11:30 AM and 1:30-4:00 PM  at the office of Dr. Shaili Deveshwar.   You may experience shorter wait times on Monday and Friday afternoons. The office is located at [address], [address], [place], [place] [address] No appointment is necessary.   Labs are drawn by [facility].  You may receive a bill from [facility] for your lab work. If you have any questions regarding directions or hours of operation,  please call [phone].     

## 2018-08-29 ENCOUNTER — Other Ambulatory Visit: Payer: Self-pay | Admitting: Rheumatology

## 2018-08-29 DIAGNOSIS — L4050 Arthropathic psoriasis, unspecified: Secondary | ICD-10-CM

## 2018-08-29 NOTE — Telephone Encounter (Signed)
Last Visit: 08/15/18 Next Visit: 11/17/18 Labs: 07/07/18 ALT is mildly elevated TB Gold: 12/29/17 Neg   Okay to refill per Dr. Corliss Skains

## 2018-09-19 NOTE — Progress Notes (Signed)
[name] [address] [place], [place] [address] Phone: [phone] Subjective:    I'm seeing this patient by the request  of:  Rodrigo Ran, MD   CC: Joint and muscle pain.  [id number]  Wendy [name] is a 52 y.o. female coming in with complaint of joint and muscle pain.  Past medical history significant for psoriatic arthritis.  Has been followed by a rheumatologist. Patient states that this pain sometimes can be unrelenting.  Feels like it is associated with significant fatigue.  Patient states that she does not seem to be able to make significant improvements.  Patient rates the severity pain is 8 out of 10 sometimes.  Sometimes feels like she does not want to get out of bed.  Feels like sometimes it is associated with her thyroid because of her other conditions such as hair loss.  Patient is very happy with her rheumatologist but is just looking to make sure she is on the right track.   Most recent labs mentioned that patient does have significant low vitamin D 20 and was given once weekly vitamin D for 12 weeks.  Patient has had x-rays of joints done.  Patient is found to have mild to moderate osteoarthritic changes of the knees and feet.  Patient was last taken in August 2019.  Other previous imaging reviewed was MRI of the cervical spine in 2014.  Found to have a C4-5 as well as C6-C7 disc protrusions causing moderate spinal stenosis.  Past Medical History:  Diagnosis Date  . Anal fissure 2004   Tx'd with diltiazem  gel by Dr. Kendrick Ranch  . Arthritis   . Bronchitis, acute 2008  . Seasonal allergies   . Thyroid disease   . URI 10/12/2007   Qualifier: Diagnosis of  By: Alwyn Ren MD, Chrissie Noa     Past Surgical History:  Procedure Laterality Date  . BREAST SURGERY  12/15/2017   breast reduction    Social History   Socioeconomic History  . Marital status: Married    Spouse name: Not on file  . Number of children: Not on file  . Years of  education: Not on file  . Highest education level: Not on file  Occupational History  . Not on file  Social Needs  . Financial resource strain: Not on file  . Food insecurity:    Worry: Not on file    Inability: Not on file  . Transportation needs:    Medical: Not on file    Non-medical: Not on file  Tobacco Use  . Smoking status: Never Smoker  . Smokeless tobacco: Never Used  Substance and Sexual Activity  . Alcohol use: Yes    Comment: occ  . Drug use: No  . Sexual activity: Not on file  Lifestyle  . Physical activity:    Days per week: Not on file    Minutes per session: Not on file  . Stress: Not on file  Relationships  . Social connections:    Talks on phone: Not on file    Gets together: Not on file    Attends religious service: Not on file    Active member of club or organization: Not on file    Attends meetings of clubs or organizations: Not on file    Relationship status: Not on file  Other Topics Concern  . Not on file  Social History Narrative  . Not on file   Allergies  Allergen Reactions  .  Pollen Extract   . Arava [Leflunomide] Other (See Comments)    Hair loss  . Methotrexate Derivatives Nausea Only   Family History  Problem Relation Age of Onset  . Hypertension Mother   . Diabetes Mother     Current Outpatient Medications (Endocrine & Metabolic):  .  levothyroxine (SYNTHROID, LEVOTHROID) 75 MCG tablet,    Current Outpatient Medications (Respiratory):  .  levocetirizine (XYZAL) 5 MG tablet, Take 5 mg by mouth daily.   Current Outpatient Medications (Hematological):  Marland Kitchen  Cyanocobalamin (VITAMIN B 12 PO), Take by mouth.  Current Outpatient Medications (Other):  Marland Kitchen  Calcium Carbonate-Vit D-Min (CALCIUM 1200 PO), Take by mouth. .  COSENTYX SENSOREADY, 300 MG, 150 MG/ML SOAJ, INJECT TWO PENS SUBCUTANEOUSLY EVERY 4 WEEKS. REFRIGERATE. ALLOW 15 TO30 MINUTES AT ROOM TEMP PRIOR TO ADMINISTRATION. Marland Kitchen  Vitamin D, Ergocalciferol, (DRISDOL) 50000 units  CAPS capsule, Take 1 capsule (50,000 Units total) by mouth 2 (two) times a week. .  Diclofenac Sodium 2 % SOLN, Place 2 g onto the skin 2 (two) times daily.    Past medical history, social, surgical and family history all reviewed in electronic medical record.  No pertanent information unless stated regarding to the chief complaint.   Review of Systems:  No headache, visual changes, nausea, vomiting, diarrhea, constipation, dizziness, abdominal pain, skin rash, fevers, chills, night sweats, weight loss, swollen lymph nodes,chest pain, shortness of breath, mood changes.  Positive muscle aches, body aches, joint swelling  Objective  Blood pressure 122/82, pulse 63, height 5\' 2"  (1.575 m), weight 153 lb (69.4 kg), SpO2 98 %.   General: No apparent distress alert and oriented x3 mood and affect normal, dressed appropriately.  HEENT: Pupils equal, extraocular movements intact  Respiratory: Patient's speak in full sentences and does not appear short of breath  Cardiovascular: No lower extremity edema, non tender, no erythema  Skin: Warm dry intact with no signs of infection or rash on extremities or on axial skeleton.  Abdomen: Soft nontender  Neuro: Cranial nerves II through XII are intact, neurovascularly intact in all extremities with 2+ DTRs and 2+ pulses.  Lymph: No lymphadenopathy of posterior or anterior cervical chain or axillae bilaterally.  Gait normal with good balance and coordination.  MSK:  tender with full range of motion and good stability and symmetric strength and tone of shoulders, elbows, wrist, hip, knee and ankles bilaterally.  Very mild synovitis noted of the MCP joints of the hands. Back Exam:  Inspection: Unremarkable  Motion: Flexion 40 deg, Extension 20deg, Side Bending to 45 deg bilaterally,  Rotation to 45 deg bilaterally  SLR laying: Negative  XSLR laying: Negative  Palpable tenderness: Mild diffuse tenderness to palpation.  Mostly over the sacroiliac joints  bilaterally. FABER: Mild positive. Sensory change: Gross sensation intact to all lumbar and sacral dermatomes.  Reflexes: 2+ at both patellar tendons, 2+ at achilles tendons, Babinski's downgoing.  Strength at foot  Plantar-flexion: 5/5 Dorsi-flexion: 5/5 Eversion: 5/5 Inversion: 5/5  Leg strength  Quad: 5/5 Hamstring: 5/5 Hip flexor: 5/5 Hip abductors: 5/5  Gait unremarkable.   Impression and Recommendations:     This case required medical decision making of moderate complexity. The above documentation has been reviewed and is accurate and complete Judi Saa, DO       Note: This dictation was prepared with Dragon dictation along with smaller phrase technology. Any transcriptional errors that result from this process are unintentional.

## 2018-09-20 ENCOUNTER — Encounter: Payer: Self-pay | Admitting: Family Medicine

## 2018-09-20 ENCOUNTER — Other Ambulatory Visit (INDEPENDENT_AMBULATORY_CARE_PROVIDER_SITE_OTHER): Payer: BLUE CROSS/BLUE SHIELD

## 2018-09-20 ENCOUNTER — Ambulatory Visit: Payer: BLUE CROSS/BLUE SHIELD | Admitting: Family Medicine

## 2018-09-20 VITALS — BP 122/82 | HR 63 | Ht 62.0 in | Wt 153.0 lb

## 2018-09-20 DIAGNOSIS — E559 Vitamin D deficiency, unspecified: Secondary | ICD-10-CM

## 2018-09-20 DIAGNOSIS — L4050 Arthropathic psoriasis, unspecified: Secondary | ICD-10-CM

## 2018-09-20 DIAGNOSIS — M2550 Pain in unspecified joint: Secondary | ICD-10-CM

## 2018-09-20 DIAGNOSIS — E538 Deficiency of other specified B group vitamins: Secondary | ICD-10-CM | POA: Diagnosis not present

## 2018-09-20 DIAGNOSIS — M47816 Spondylosis without myelopathy or radiculopathy, lumbar region: Secondary | ICD-10-CM | POA: Diagnosis not present

## 2018-09-20 LAB — T3, FREE: T3, Free: 2.5 pg/mL (ref 2.3–4.2)

## 2018-09-20 LAB — IBC PANEL
Iron: 71 ug/dL (ref 42–145)
Saturation Ratios: 16.2 % — ABNORMAL LOW (ref 20.0–50.0)
TRANSFERRIN: 313 mg/dL (ref 212.0–360.0)

## 2018-09-20 LAB — URIC ACID: Uric Acid, Serum: 5.2 mg/dL (ref 2.4–7.0)

## 2018-09-20 LAB — VITAMIN B12

## 2018-09-20 LAB — FERRITIN: Ferritin: 21.5 ng/mL (ref 10.0–291.0)

## 2018-09-20 LAB — T4, FREE: Free T4: 1.06 ng/dL (ref 0.60–1.60)

## 2018-09-20 LAB — FOLATE: FOLATE: 14 ng/mL (ref >5.9)

## 2018-09-20 LAB — TSH: TSH: 0.84 u[IU]/mL (ref 0.35–4.50)

## 2018-09-20 MED ORDER — DICLOFENAC SODIUM 2 % TD SOLN: 2.0000 g | Freq: Two times a day (BID) | TRANSDERMAL | 3 refills | Status: DC

## 2018-09-20 MED ORDER — CYANOCOBALAMIN 1000 MCG/ML IJ SOLN
1000.0000 ug | Freq: Once | INTRAMUSCULAR | Status: AC
  Administered 2018-09-20: 1000 ug via INTRAMUSCULAR

## 2018-09-20 NOTE — Patient Instructions (Signed)
Good to see you  B12 injection  We will get labs today  Turmeric 500mg  daily  Tart cherry extract any dose at night  Iron 65mg  and vitamin C 500mg  3 times a week  DHEA 50 mg daily for 4 weeks then 2 weeks off  Vega or Earth of life protein supplement within 30 minutes of working out See me again in 4-6 weeks

## 2018-09-20 NOTE — Assessment & Plan Note (Signed)
Has had deficiency previously but has not been checked for some time.  Repeat check today.

## 2018-09-20 NOTE — Assessment & Plan Note (Signed)
Agree with patient's rheumatology care at this time.

## 2018-09-20 NOTE — Assessment & Plan Note (Signed)
Patient has been doing relatively well overall though pain.  Patient has been well treated.  Do not see significant changes.  We discussed different ways to potentially optimize.  Other laboratory work-up including free T3 free T4, B12 also done.  I do not see significant breakdown of any of the joints at this time.  Patient seems to be doing well with the medication she is on now at the moment.  Depending on findings and laboratory work-up will consider further changes but I think patient is doing well overall.

## 2018-09-20 NOTE — Assessment & Plan Note (Signed)
She did not make significant difference with vitamin D supplementation over 8 weeks.  Will likely need once weekly vitamin D for a long duration.

## 2018-09-21 ENCOUNTER — Encounter: Payer: Self-pay | Admitting: Family Medicine

## 2018-09-21 LAB — PTH, INTACT AND CALCIUM
CALCIUM: 9.3 mg/dL (ref 8.6–10.4)
PTH: 66 pg/mL — AB (ref 14–64)

## 2018-09-21 LAB — CALCIUM, IONIZED: CALCIUM ION: 5.06 mg/dL (ref 4.8–5.6)

## 2018-10-13 DIAGNOSIS — Z01419 Encounter for gynecological examination (general) (routine) without abnormal findings: Secondary | ICD-10-CM | POA: Diagnosis not present

## 2018-10-13 DIAGNOSIS — Z23 Encounter for immunization: Secondary | ICD-10-CM | POA: Diagnosis not present

## 2018-10-13 DIAGNOSIS — Z6829 Body mass index (BMI) 29.0-29.9, adult: Secondary | ICD-10-CM | POA: Diagnosis not present

## 2018-10-13 DIAGNOSIS — Z1231 Encounter for screening mammogram for malignant neoplasm of breast: Secondary | ICD-10-CM | POA: Diagnosis not present

## 2018-10-13 DIAGNOSIS — N898 Other specified noninflammatory disorders of vagina: Secondary | ICD-10-CM | POA: Diagnosis not present

## 2018-11-02 ENCOUNTER — Ambulatory Visit: Payer: BLUE CROSS/BLUE SHIELD | Admitting: Family Medicine

## 2018-11-03 NOTE — Progress Notes (Signed)
[name] [address] [place], [place] [address] Phone: [phone] Subjective:   [name], am serving as a scribe for Dr. Antoine Primas.  I'm seeing this patient by the request  of:    CC: Follow-up for multiple joint pains  [id number]  Wendy [name] is a 52 y.o. female coming in with complaint of polyarthralgia. She has been taking DHEA and Vitamin D. No improvement since last visit.  Patient states no side effects to any of the vitamins but did not notice anything.  Did get laboratory work-up.  Laboratory work-up did show mild iron deficiency.  Otherwise fairly unremarkable.  Still has aching pains most days of the week.  Still with chronic fatigue-like symptoms.  Continues have trouble overall.  Would like to be feeling better.   Past medical history is significant for psoriatic arthritis  Past Medical History:  Diagnosis Date  . Anal fissure 2004   Tx'd with diltiazem  gel by Dr. Kendrick Ranch  . Arthritis   . Bronchitis, acute 2008  . Seasonal allergies   . Thyroid disease   . URI 10/12/2007   Qualifier: Diagnosis of  By: Alwyn Ren MD, Chrissie Noa     Past Surgical History:  Procedure Laterality Date  . BREAST SURGERY  12/15/2017   breast reduction    Social History   Socioeconomic History  . Marital status: Married    Spouse name: Not on file  . Number of children: Not on file  . Years of education: Not on file  . Highest education level: Not on file  Occupational History  . Not on file  Social Needs  . Financial resource strain: Not on file  . Food insecurity:    Worry: Not on file    Inability: Not on file  . Transportation needs:    Medical: Not on file    Non-medical: Not on file  Tobacco Use  . Smoking status: Never Smoker  . Smokeless tobacco: Never Used  Substance and Sexual Activity  . Alcohol use: Yes    Comment: occ  . Drug use: No  . Sexual activity: Not on file  Lifestyle  . Physical activity:    Days per  week: Not on file    Minutes per session: Not on file  . Stress: Not on file  Relationships  . Social connections:    Talks on phone: Not on file    Gets together: Not on file    Attends religious service: Not on file    Active member of club or organization: Not on file    Attends meetings of clubs or organizations: Not on file    Relationship status: Not on file  Other Topics Concern  . Not on file  Social History Narrative  . Not on file   Allergies  Allergen Reactions  . Pollen Extract   . Arava [Leflunomide] Other (See Comments)    Hair loss  . Methotrexate Derivatives Nausea Only   Family History  Problem Relation Age of Onset  . Hypertension Mother   . Diabetes Mother     Current Outpatient Medications (Endocrine & Metabolic):  .  levothyroxine (SYNTHROID, LEVOTHROID) 75 MCG tablet,    Current Outpatient Medications (Respiratory):  .  levocetirizine (XYZAL) 5 MG tablet, Take 5 mg by mouth daily.   Current Outpatient Medications (Hematological):  Marland Kitchen  Cyanocobalamin (VITAMIN B 12 PO), Take by mouth.  Current Outpatient Medications (Other):  Marland Kitchen  Calcium Carbonate-Vit D-Min (  CALCIUM 1200 PO), Take by mouth. .  COSENTYX SENSOREADY, 300 MG, 150 MG/ML SOAJ, INJECT TWO PENS SUBCUTANEOUSLY EVERY 4 WEEKS. REFRIGERATE. ALLOW 15 TO30 MINUTES AT ROOM TEMP PRIOR TO ADMINISTRATION. .  Diclofenac Sodium 2 % SOLN, Place 2 g onto the skin 2 (two) times daily. .  Vitamin D, Ergocalciferol, (DRISDOL) 50000 units CAPS capsule, Take 1 capsule (50,000 Units total) by mouth 2 (two) times a week.    Past medical history, social, surgical and family history all reviewed in electronic medical record.  No pertanent information unless stated regarding to the chief complaint.   Review of Systems:  No headache, visual changes, nausea, vomiting, diarrhea, constipation, dizziness, abdominal pain, skin rash, fevers, chills, night sweats, weight loss, swollen lymph nodes, body aches, joint  swelling, muscle aches, chest pain, shortness of breath, mood changes.   Objective  There were no vitals taken for this visit. Systems examined below as of    General: No apparent distress alert and oriented x3 mood and affect normal, dressed appropriately.  HEENT: Pupils equal, extraocular movements intact  Respiratory: Patient's speak in full sentences and does not appear short of breath  Cardiovascular: No lower extremity edema, non tender, no erythema  Skin: Warm dry intact with no signs of infection or rash on extremities or on axial skeleton.  Abdomen: Soft nontender  Neuro: Cranial nerves II through XII are intact, neurovascularly intact in all extremities with 2+ DTRs and 2+ pulses.  Lymph: No lymphadenopathy of posterior or anterior cervical chain or axillae bilaterally.  Gait normal with good balance and coordination.  MSK:  tender with mild limited range of motion and good stability and symmetric strength and tone of shoulders, elbows, wrist, hip, knee and ankles bilaterally.   Back exam shows some mild loss of lordosis.  Tenderness to palpation in the paraspinal musculature from the cervical spine down to the lumbar spine.  Osteopathic findings T9 extended rotated and side bent left L2 flexed rotated and side bent right Sacrum right on right     Impression and Recommendations:     This case required medical decision making of moderate complexity. The above documentation has been reviewed and is accurate and complete Judi SaaZachary M Smith, DO       Note: This dictation was prepared with Dragon dictation along with smaller phrase technology. Any transcriptional errors that result from this process are unintentional.

## 2018-11-04 ENCOUNTER — Telehealth: Payer: Self-pay | Admitting: Family Medicine

## 2018-11-04 ENCOUNTER — Ambulatory Visit: Payer: BLUE CROSS/BLUE SHIELD | Admitting: Family Medicine

## 2018-11-04 DIAGNOSIS — M47816 Spondylosis without myelopathy or radiculopathy, lumbar region: Secondary | ICD-10-CM | POA: Diagnosis not present

## 2018-11-04 DIAGNOSIS — M999 Biomechanical lesion, unspecified: Secondary | ICD-10-CM | POA: Diagnosis not present

## 2018-11-04 DIAGNOSIS — L4050 Arthropathic psoriasis, unspecified: Secondary | ICD-10-CM | POA: Diagnosis not present

## 2018-11-04 NOTE — Assessment & Plan Note (Addendum)
Decision today to treat with OMT was based on Physical Exam  After verbal consent patient was treated with HVLA, ME, FPR techniques in , thoracic, lumbar and sacral areas  Patient tolerated the procedure well with improvement in symptoms  Patient given exercises, stretches and lifestyle modifications  See medications in patient instructions if given  Patient will follow up in 4-6 weeks 

## 2018-11-04 NOTE — Assessment & Plan Note (Signed)
No arthritic changes.  Attempted osteopathic manipulation.  Follow-up did fairly well with this.  Discussed on the iron deficiency anemia.  Discussed the icing regimen.  Continue all other medications.  Follow-up again in 4 to 6 weeks

## 2018-11-04 NOTE — Assessment & Plan Note (Signed)
Likely contributing to a lot of her aches and pains.  Patient is continuing different medications.  Hopefully this will be beneficial for her.  Discussed other medications that could be adjunct such as Effexor.  We will decide at follow-up.

## 2018-11-04 NOTE — Telephone Encounter (Signed)
Patient would like to know if she could be worked in on 12/2 or 12/3 to be seen before she leaves out of town.

## 2018-11-04 NOTE — Progress Notes (Signed)
Office Visit Note  Patient: Wendy BurkeGita N Raven             Date of Birth: 28-Jun-1966           MRN: [id number]             PCP: Rodrigo RanPerini, Mark, MD Referring: Rodrigo RanPerini, Mark, MD Visit Date: 11/17/2018 Occupation: @GUAROCC @  Subjective:  Left knee pain.  History of Present Illness: Wendy [name] is a 52 y.o. female  with history of psoriatic arthritis, osteoarthritis, and DDD.  She has been experiencing some pain in her left knee joint..  She is been experiencing some muscle spasms in upper and lower extremities.  She denies any SI joint pain currently.  There is been no recent flares of plantar fasciitis.  Patient states that she had Shingrix vaccine yesterday and she has been experiencing chills and fatigue.  Activities of Daily Living:  Patient reports morning stiffness for 2 minutes.   Patient Denies nocturnal pain.  Difficulty dressing/grooming: Denies Difficulty climbing stairs: Reports Difficulty getting out of chair: Reports Difficulty using hands for taps, buttons, cutlery, and/or writing: Denies  Review of Systems  Constitutional: Positive for fatigue. Negative for night sweats, weight gain and weight loss.  HENT: Positive for mouth dryness. Negative for mouth sores, trouble swallowing, trouble swallowing and nose dryness.   Eyes: Positive for dryness. Negative for pain, redness and visual disturbance.  Respiratory: Negative for cough, shortness of breath and difficulty breathing.   Cardiovascular: Negative for chest pain, palpitations, hypertension, irregular heartbeat and swelling in legs/feet.  Gastrointestinal: Negative for blood in stool, constipation and diarrhea.  Endocrine: Negative for increased urination.  Genitourinary: Negative for vaginal dryness.  Musculoskeletal: Positive for arthralgias, joint pain and morning stiffness. Negative for joint swelling, myalgias, muscle weakness, muscle tenderness and myalgias.  Skin: Negative for color change, rash, hair loss, skin  tightness, ulcers and sensitivity to sunlight.  Allergic/Immunologic: Negative for susceptible to infections.  Neurological: Negative for dizziness, memory loss, night sweats and weakness.  Hematological: Negative for swollen glands.  Psychiatric/Behavioral: Positive for sleep disturbance. Negative for depressed mood. The patient is not nervous/anxious.     PMFS History:  Patient Active Problem List   Diagnosis Date Noted  . Nonallopathic lesion of lumbosacral region 11/04/2018  . Nonallopathic lesion of sacral region 11/04/2018  . Nonallopathic lesion of thoracic region 11/04/2018  . Psoriasis 07/05/2017  . DJD (degenerative joint disease), cervical 02/03/2017  . Spondylosis of lumbar region without myelopathy or radiculopathy 02/03/2017  . High risk medication use 10/06/2016  . Plantar fasciitis 10/06/2016  . Achilles tendinitis of both lower extremities 10/06/2016  . Vitamin D deficiency 10/06/2016  . Primary osteoarthritis of both knees 10/06/2016  . Primary osteoarthritis of both feet 10/06/2016  . Chronic low back pain without sciatica 10/06/2016  . Hypothyroidism 10/06/2016  . Polyp of colon 10/06/2016  . B12 deficiency 10/06/2016  . 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 Anal fissure 04/11/2012    Past Medical History:  Diagnosis Date  . Anal fissure 2004   Tx'd with diltiazem  gel by Dr. Kendrick Ranchim Davis  . Arthritis   . Bronchitis, acute 2008  . Seasonal allergies   . Thyroid disease   . URI 10/12/2007   Qualifier: Diagnosis of  By: Alwyn RenHopper MD, Barbee ShropshireWilliam      Family History  Problem Relation Age of Onset  . Hypertension Mother   . Diabetes Mother    Past Surgical History:  Procedure Laterality Date  . BREAST  SURGERY  12/15/2017   breast reduction    Social History   Social History Narrative  . Not on file    Immunization History  Administered Date(s) Administered  . Pneumococcal Polysaccharide-23 10/28/2010    Objective: Vital Signs: BP 108/77 (BP  Location: Left Arm, Patient Position: Sitting, Cuff Size: Normal)   Pulse 88   Temp 98.3 F (36.8 C) (Oral)   Resp 13   Ht 5\' 2"  (1.575 m)   Wt 157 lb 12.8 oz (71.6 kg)   BMI 28.86 kg/m    Physical Exam Vitals signs and nursing note reviewed.  Constitutional:      Appearance: She is well-developed.  HENT:     Head: Normocephalic and atraumatic.  Eyes:     Conjunctiva/sclera: Conjunctivae normal.  Neck:     Musculoskeletal: Normal range of motion.  Cardiovascular:     Rate and Rhythm: Normal rate and regular rhythm.     Heart sounds: Normal heart sounds.  Pulmonary:     Effort: Pulmonary effort is normal.     Breath sounds: Normal breath sounds.  Abdominal:     General: Bowel sounds are normal.     Palpations: Abdomen is soft.  Lymphadenopathy:     Cervical: No cervical adenopathy.  Skin:    General: Skin is warm and dry.     Capillary Refill: Capillary refill takes less than 2 seconds.  Neurological:     Mental Status: She is alert and oriented to person, place, and time.  Psychiatric:        Behavior: Behavior normal.      Musculoskeletal Exam: C-spine thoracic lumbar spine good range of motion.  She had no SI joint tenderness on examination.  Shoulder joints, elbow joints, wrist joints, MCPs PIPs DIPs been good range of motion with no synovitis.  Hip joints knee joints ankles MTPs PIPs DIPs were in good range of motion with no synovitis.  She had discomfort with range of motion of bilateral knee joints without any warmth swelling or effusion.  CDAI Exam: CDAI Score: 0.6  Patient Global Assessment: 3 (mm); Provider Global Assessment: 3 (mm) Swollen: 0 ; Tender: 0  Joint Exam   Not documented   There is currently no information documented on the homunculus. Go to the Rheumatology activity and complete the homunculus joint exam.  Investigation: No additional findings.  Imaging: No results found.  Recent Labs: Lab Results  Component Value Date   WBC 6.6  11/11/2018   HGB 13.4 11/11/2018   PLT 197 11/11/2018   NA 140 11/11/2018   K 4.3 11/11/2018   CL 104 11/11/2018   CO2 29 11/11/2018   GLUCOSE 86 11/11/2018   BUN 12 11/11/2018   CREATININE 0.90 11/11/2018   BILITOT 0.4 11/11/2018   ALKPHOS 77 05/27/2017   AST 19 11/11/2018   ALT 20 11/11/2018   PROT 7.6 11/11/2018   ALBUMIN 3.9 05/27/2017   CALCIUM 9.8 11/11/2018   GFRAA 85 11/11/2018   QFTBGOLDPLUS NEGATIVE 11/11/2018    Speciality Comments: No specialty comments available.  Procedures:  Large Joint Inj: L knee on 11/17/2018 2:41 PM Indications: pain Details: 27 G 1.5 in needle, medial approach  Arthrogram: No  Medications: 40 mg triamcinolone acetonide 40 MG/ML; 1.5 mL lidocaine 1 % Aspirate: 0 mL Outcome: tolerated well, no immediate complications Procedure, treatment alternatives, risks and benefits explained, specific risks discussed. Consent was given by the patient. Immediately prior to procedure a time out was called to verify the correct patient,  procedure, equipment, support staff and site/side marked as required. Patient was prepped and draped in the usual sterile fashion.     Allergies: Pollen extract; Arava [leflunomide]; and Methotrexate derivatives   Assessment / Plan:     Visit Diagnoses: Psoriatic arthritis (HCC)-patient had no active synovitis on examination.  Although she has been experiencing some discomfort in her left hand.  Her SI joint pain is better.  She has not had any recent plantar fasciitis.  Psoriasis-she had no active lesions of psoriasis.  High risk medication use - Cosentyx 300 mg every 28 days.  Last TB gold negative on 11/11/2018.  Most recent CBC/CMP within normal limits on 11/11/2018.  Next CBC/CMP due in March and then every 3 months.  Standing orders are in place.  Recommend flu, Prevnar 13 as indicated.  Patient had Shingrix vaccine yesterday and has been experiencing some side effects from it.  Her labs have been stable.  We will  continue to monitor labs every 3 months.  Primary osteoarthritis of both knees-I reviewed her x-rays of her knee joints done in August 2019 which showed moderate osteoarthritis in her knee joints.  Her cortisone injection of left knee joint did not given in August helped her until recently but now the pain has recurred.  Per her request left knee joint was injected with cortisone as described above.  We also discussed possible use of Visco supplement injections in future.  She would like for [place] to apply for bilateral knee joint Visco supplement injections.  Primary osteoarthritis of both feet-she has chronic discomfort.  Other fatigue-she has underlying fatigue but the fatigue has been worse recently after Shingrix vaccine.  Sacroiliac pain - MRI SIJ negative for sacroiliitis.  Her SI joint pain has improved.  Spondylosis of lumbar region without myelopathy or radiculopathy-she has chronic discomfort off and on.  DDD (degenerative disc disease), cervical-doing quite well.  History of vitamin D deficiency-she is on supplement.  History of hypothyroidism   Orders: Orders Placed This Encounter  Procedures  . Large Joint Inj   No orders of the defined types were placed in this encounter.    Follow-Up Instructions: Return in about 5 months (around 04/18/2019) for Psoriatic arthritis.   Pollyann Savoy, MD  Note - This record has been created using Animal nutritionist.  Chart creation errors have been sought, but may not always  have been located. Such creation errors do not reflect on  the standard of medical care.

## 2018-11-04 NOTE — Patient Instructions (Signed)
Good to see you  Ice 20 minutes 2 times daily. Usually after activity and before bed. Iron (ferrous gluconate) 65 mg daily with 500mg  of vitamin C.  If constipation then go down to 3 times a week  Continue the other vitamins Stay active  Tried manipulation today as well  Read about effexor See me again in 3-4 weeks

## 2018-11-07 NOTE — Telephone Encounter (Signed)
Pt is returning lindsay call. Pt unable to hold

## 2018-11-07 NOTE — Telephone Encounter (Signed)
lmovm for pt to return call.  

## 2018-11-08 NOTE — Telephone Encounter (Signed)
Spoke with pt. Scheduled her for 12/01/17.

## 2018-11-09 ENCOUNTER — Other Ambulatory Visit: Payer: Self-pay | Admitting: Rheumatology

## 2018-11-09 DIAGNOSIS — Z9225 Personal history of immunosupression therapy: Secondary | ICD-10-CM

## 2018-11-09 DIAGNOSIS — L4050 Arthropathic psoriasis, unspecified: Secondary | ICD-10-CM

## 2018-11-09 DIAGNOSIS — R5383 Other fatigue: Secondary | ICD-10-CM

## 2018-11-10 NOTE — Telephone Encounter (Addendum)
Last Visit: 11/04/18 Next Visit: 11/17/18 Labs: 07/07/18 ALT is mildly elevated TB Gold: 12/29/17 Neg   Patient updated labs 11/11/18  Okay to refill per Dr. Corliss Skainseveshwar

## 2018-11-11 ENCOUNTER — Other Ambulatory Visit: Payer: Self-pay

## 2018-11-11 DIAGNOSIS — Z9225 Personal history of immunosupression therapy: Secondary | ICD-10-CM

## 2018-11-11 DIAGNOSIS — Z79899 Other long term (current) drug therapy: Secondary | ICD-10-CM

## 2018-11-11 DIAGNOSIS — R5383 Other fatigue: Secondary | ICD-10-CM | POA: Diagnosis not present

## 2018-11-11 DIAGNOSIS — E559 Vitamin D deficiency, unspecified: Secondary | ICD-10-CM

## 2018-11-14 ENCOUNTER — Telehealth: Payer: Self-pay | Admitting: *Deleted

## 2018-11-14 LAB — CBC WITH DIFFERENTIAL/PLATELET
Basophils Absolute: 73 cells/uL (ref 0–200)
Basophils Relative: 1.1 %
Eosinophils Absolute: 178 cells/uL (ref 15–500)
Eosinophils Relative: 2.7 %
HCT: 41 % (ref 35.0–45.0)
HEMOGLOBIN: 13.4 g/dL (ref 11.7–15.5)
Lymphs Abs: 1624 cells/uL (ref 850–3900)
MCH: 28.3 pg (ref 27.0–33.0)
MCHC: 32.7 g/dL (ref 32.0–36.0)
MCV: 86.5 fL (ref 80.0–100.0)
MPV: 12.7 fL — ABNORMAL HIGH (ref 7.5–12.5)
Monocytes Relative: 6 %
NEUTROS ABS: 4330 {cells}/uL (ref 1500–7800)
Neutrophils Relative %: 65.6 %
Platelets: 197 10*3/uL (ref 140–400)
RBC: 4.74 10*6/uL (ref 3.80–5.10)
RDW: 13.1 % (ref 11.0–15.0)
Total Lymphocyte: 24.6 %
WBC mixed population: 396 cells/uL (ref 200–950)
WBC: 6.6 10*3/uL (ref 3.8–10.8)

## 2018-11-14 LAB — COMPLETE METABOLIC PANEL WITH GFR
AG Ratio: 1.3 (calc) (ref 1.0–2.5)
ALKALINE PHOSPHATASE (APISO): 82 U/L (ref 33–130)
ALT: 20 U/L (ref 6–29)
AST: 19 U/L (ref 10–35)
Albumin: 4.3 g/dL (ref 3.6–5.1)
BILIRUBIN TOTAL: 0.4 mg/dL (ref 0.2–1.2)
BUN: 12 mg/dL (ref 7–25)
CHLORIDE: 104 mmol/L (ref 98–110)
CO2: 29 mmol/L (ref 20–32)
Calcium: 9.8 mg/dL (ref 8.6–10.4)
Creat: 0.9 mg/dL (ref 0.50–1.05)
GFR, EST AFRICAN AMERICAN: 85 mL/min/{1.73_m2} (ref >=60)
GFR, Est Non African American: 73 mL/min/{1.73_m2} (ref >=60)
GLUCOSE: 86 mg/dL (ref 65–99)
Globulin: 3.3 g/dL (calc) (ref 1.9–3.7)
Potassium: 4.3 mmol/L (ref 3.5–5.3)
Sodium: 140 mmol/L (ref 135–146)
TOTAL PROTEIN: 7.6 g/dL (ref 6.1–8.1)

## 2018-11-14 LAB — VITAMIN D 25 HYDROXY (VIT D DEFICIENCY, FRACTURES): Vit D, 25-Hydroxy: 36 ng/mL (ref 30–100)

## 2018-11-14 LAB — QUANTIFERON-TB GOLD PLUS
Mitogen-NIL: >10 IU/mL
NIL: 0.02 IU/mL
QuantiFERON-TB Gold Plus: NEGATIVE
TB1-NIL: 0 IU/mL
TB2-NIL: 0 IU/mL

## 2018-11-14 LAB — TSH: TSH: 0.74 mIU/L

## 2018-11-14 MED ORDER — VITAMIN D (ERGOCALCIFEROL) 1.25 MG (50000 UNIT) PO CAPS: 50000.0000 [IU] | ORAL_CAPSULE | ORAL | 1 refills | Status: DC

## 2018-11-14 NOTE — Progress Notes (Signed)
Patient should take vitamin D 50,000 units once a month or 2000 units daily.  Rest the labs are normal.

## 2018-11-14 NOTE — Telephone Encounter (Signed)
-----   Message from Pollyann SavoyShaili Deveshwar, MD sent at 11/14/2018  9:30 AM EST ----- Patient should take vitamin D 50,000 units once a month or 2000 units daily.  Rest the labs are normal.

## 2018-11-17 ENCOUNTER — Encounter: Payer: Self-pay | Admitting: Rheumatology

## 2018-11-17 ENCOUNTER — Ambulatory Visit: Payer: BLUE CROSS/BLUE SHIELD | Admitting: Rheumatology

## 2018-11-17 ENCOUNTER — Telehealth: Payer: Self-pay

## 2018-11-17 VITALS — BP 108/77 | HR 88 | Temp 98.3°F | Resp 13 | Ht 62.0 in | Wt 157.8 lb

## 2018-11-17 DIAGNOSIS — M25562 Pain in left knee: Secondary | ICD-10-CM

## 2018-11-17 DIAGNOSIS — L409 Psoriasis, unspecified: Secondary | ICD-10-CM | POA: Diagnosis not present

## 2018-11-17 DIAGNOSIS — R5383 Other fatigue: Secondary | ICD-10-CM

## 2018-11-17 DIAGNOSIS — M170 Bilateral primary osteoarthritis of knee: Secondary | ICD-10-CM | POA: Diagnosis not present

## 2018-11-17 DIAGNOSIS — M19071 Primary osteoarthritis, right ankle and foot: Secondary | ICD-10-CM

## 2018-11-17 DIAGNOSIS — G8929 Other chronic pain: Secondary | ICD-10-CM

## 2018-11-17 DIAGNOSIS — Z79899 Other long term (current) drug therapy: Secondary | ICD-10-CM | POA: Diagnosis not present

## 2018-11-17 DIAGNOSIS — Z8639 Personal history of other endocrine, nutritional and metabolic disease: Secondary | ICD-10-CM

## 2018-11-17 DIAGNOSIS — M19072 Primary osteoarthritis, left ankle and foot: Secondary | ICD-10-CM

## 2018-11-17 DIAGNOSIS — M533 Sacrococcygeal disorders, not elsewhere classified: Secondary | ICD-10-CM

## 2018-11-17 DIAGNOSIS — M5030 Other cervical disc degeneration, unspecified cervical region: Secondary | ICD-10-CM

## 2018-11-17 DIAGNOSIS — L4050 Arthropathic psoriasis, unspecified: Secondary | ICD-10-CM

## 2018-11-17 DIAGNOSIS — M47816 Spondylosis without myelopathy or radiculopathy, lumbar region: Secondary | ICD-10-CM

## 2018-11-17 MED ORDER — TRIAMCINOLONE ACETONIDE 40 MG/ML IJ SUSP
40.0000 mg | INTRAMUSCULAR | Status: AC | PRN
  Administered 2018-11-17: 40 mg via INTRA_ARTICULAR

## 2018-11-17 MED ORDER — LIDOCAINE HCL 1 % IJ SOLN
1.5000 mL | INTRAMUSCULAR | Status: AC | PRN
  Administered 2018-11-17: 1.5 mL

## 2018-11-17 NOTE — Patient Instructions (Signed)
Standing Labs We placed an order today for your standing lab work.    Please come back and get your standing labs in March and every 3 months  We have open lab Monday through Friday from 8:30-11:30 AM and 1:30-4:00 PM  at the office of Dr. Kimarion Chery.   You may experience shorter wait times on Monday and Friday afternoons. The office is located at [address], [address], [place], [place] [address] No appointment is necessary.   Labs are drawn by [facility].  You may receive a bill from [facility] for your lab work.  If you wish to have your labs drawn at another location, please call the office 24 hours in advance to send orders.  If you have any questions regarding directions or hours of operation,  please call [phone].   Just as a reminder please drink plenty of water prior to coming for your lab work. Thanks!  

## 2018-11-17 NOTE — Telephone Encounter (Signed)
Please apply for bilateral knee visco, per Dr. Deveshwar. Thanks!  

## 2018-11-28 NOTE — Telephone Encounter (Signed)
Noted. Will submit after 11/30/2018. 

## 2018-11-30 NOTE — Progress Notes (Signed)
Tawana ScaleZach Wanisha Shiroma D.O. [facility] [address] [place], [place] [address] Phone: [phone] Subjective:   [name], [name], am serving as a scribe for Dr. Antoine PrimasZachary Serita Degroote.   CC: back pain   WGN:[id number]:Subjective  Wendy BurkeGita N [name] is a 53 y.o. female coming in with complaint of back pain. Does still have achy feeling in lumbar spine and cervical spine. Has had a massage and does hot yoga to help alleviate her pain.  Patient is making some positive improvement which is been a long time.  Patient does have the psoriatic arthritis that is likely contributing to some of her discomfort and pain.  Patient is considering the possibility of stopping her autoimmune medications because she does not feel like it is significantly beneficial and feels that the other things we have been doing has been more helpful.      Past Medical History:  Diagnosis Date  . Anal fissure 2004   Tx'd with diltiazem  gel by Dr. Kendrick Ranchim Davis  . Arthritis   . Bronchitis, acute 2008  . Seasonal allergies   . Thyroid disease   . URI 10/12/2007   Qualifier: Diagnosis of  By: Alwyn RenHopper MD, Chrissie NoaWilliam     Past Surgical History:  Procedure Laterality Date  . BREAST SURGERY  12/15/2017   breast reduction    Social History   Socioeconomic History  . Marital status: Married    Spouse name: Not on file  . Number of children: Not on file  . Years of education: Not on file  . Highest education level: Not on file  Occupational History  . Not on file  Social Needs  . Financial resource strain: Not on file  . Food insecurity:    Worry: Not on file    Inability: Not on file  . Transportation needs:    Medical: Not on file    Non-medical: Not on file  Tobacco Use  . Smoking status: Never Smoker  . Smokeless tobacco: Never Used  Substance and Sexual Activity  . Alcohol use: Yes    Comment: occ  . Drug use: No  . Sexual activity: Not on file  Lifestyle  . Physical activity:    Days per week: Not on file    Minutes per  session: Not on file  . Stress: Not on file  Relationships  . Social connections:    Talks on phone: Not on file    Gets together: Not on file    Attends religious service: Not on file    Active member of club or organization: Not on file    Attends meetings of clubs or organizations: Not on file    Relationship status: Not on file  Other Topics Concern  . Not on file  Social History Narrative  . Not on file   Allergies  Allergen Reactions  . Pollen Extract   . Arava [Leflunomide] Other (See Comments)    Hair loss  . Methotrexate Derivatives Nausea Only   Family History  Problem Relation Age of Onset  . Hypertension Mother   . Diabetes Mother     Current Outpatient Medications (Endocrine & Metabolic):  .  levothyroxine (SYNTHROID, LEVOTHROID) 75 MCG tablet,    Current Outpatient Medications (Respiratory):  .  levocetirizine (XYZAL) 5 MG tablet, Take 5 mg by mouth daily.   Current Outpatient Medications (Hematological):  Marland Kitchen.  Cyanocobalamin (VITAMIN B 12 PO), Take by mouth.  Current Outpatient Medications (Other):  Marland Kitchen.  Calcium Carbonate-Vit D-Min (CALCIUM 1200  PO), Take by mouth. .  COSENTYX SENSOREADY, 300 MG, 150 MG/ML SOAJ, INJECT TWO PENS SUBCUTANEOUSLY EVERY 4 WEEKS. REFRIGERATE. ALLOW 15 TO30 MINUTES AT ROOM TEMP PRIOR TO ADMINISTRATION. .  Diclofenac Sodium 2 % SOLN, Place 2 g onto the skin 2 (two) times daily. .  Vitamin D, Ergocalciferol, (DRISDOL) 1.25 MG (50000 UT) CAPS capsule, Take 1 capsule (50,000 Units total) by mouth every 30 (thirty) days.    Past medical history, social, surgical and family history all reviewed in electronic medical record.  No pertanent information unless stated regarding to the chief complaint.   Review of Systems:  No headache, visual changes, nausea, vomiting, diarrhea, constipation, dizziness, abdominal pain, skin rash, fevers, chills, night sweats, weight loss, swollen lymph nodes, body aches, joint swelling, muscle aches, chest  pain, shortness of breath, mood changes.  Positive muscle aches  Objective  Blood pressure 120/82, pulse 80, height 5\' 2"  (1.575 m), weight 158 lb (71.7 kg), SpO2 99 %.    General: No apparent distress alert and oriented x3 mood and affect normal, dressed appropriately.  HEENT: Pupils equal, extraocular movements intact  Respiratory: Patient's speak in full sentences and does not appear short of breath  Cardiovascular: No lower extremity edema, non tender, no erythema  Skin: Warm dry intact with no signs of infection or rash on extremities or on axial skeleton.  Abdomen: Soft nontender  Neuro: Cranial nerves II through XII are intact, neurovascularly intact in all extremities with 2+ DTRs and 2+ pulses.  Lymph: No lymphadenopathy of posterior or anterior cervical chain or axillae bilaterally.  Gait normal with good balance and coordination.  MSK:  tender with full range of motion and good stability and symmetric strength and tone of shoulders, elbows, wrist, hip, knee and ankles bilaterally.  Neck: Inspection loss of lordosis. No palpable stepoffs. Negative Spurling's maneuver. Full neck range of motion Grip strength and sensation normal in bilateral hands Strength good C4 to T1 distribution No sensory change to C4 to T1 Negative Hoffman sign bilaterally Reflexes normal Tightness in the left trapezius area  Patient lumbar spine does have some mild loss of lordosis.  Patient does have a diffuse tenderness to palpation of the paraspinal musculature lumbar spine.  Mild positive Faber test bilaterally.  Negative straight leg test bilaterally.  Deep tendon reflexes intact and symmetric  Osteopathic findings T4 extended rotated and side bent left inhaled third rib T6 extended rotated and side bent left L2 flexed rotated and side bent right Sacrum right on right      Impression and Recommendations:     This case required medical decision making of moderate complexity. The above  documentation has been reviewed and is accurate and complete Judi Saa, DO       Note: This dictation was prepared with Dragon dictation along with smaller phrase technology. Any transcriptional errors that result from this process are unintentional.

## 2018-12-01 ENCOUNTER — Encounter: Payer: Self-pay | Admitting: Family Medicine

## 2018-12-01 ENCOUNTER — Ambulatory Visit: Payer: BLUE CROSS/BLUE SHIELD | Admitting: Family Medicine

## 2018-12-01 VITALS — BP 120/82 | HR 80 | Ht 62.0 in | Wt 158.0 lb

## 2018-12-01 DIAGNOSIS — M545 Low back pain, unspecified: Secondary | ICD-10-CM

## 2018-12-01 DIAGNOSIS — G8929 Other chronic pain: Secondary | ICD-10-CM | POA: Diagnosis not present

## 2018-12-01 DIAGNOSIS — L4050 Arthropathic psoriasis, unspecified: Secondary | ICD-10-CM

## 2018-12-01 DIAGNOSIS — M999 Biomechanical lesion, unspecified: Secondary | ICD-10-CM

## 2018-12-01 NOTE — Assessment & Plan Note (Signed)
Low back pain.  Patient does have some tightness overall.  I do believe that this is multifactorial.  Has been responding well to osteopathic manipulation, higher doses of vitamin D, icing regimen topical anti-inflammatories.  Patient has been doing other activities and seems to be actually improving with the increasing activity.  Patient will follow-up with me again in 4 to 8 weeks for further evaluation and treatment.

## 2018-12-01 NOTE — Assessment & Plan Note (Signed)
Decision today to treat with OMT was based on Physical Exam  After verbal consent patient was treated with HVLA, ME, FPR techniques in  thoracic, lumbar and sacral areas  Patient tolerated the procedure well with improvement in symptoms  Patient given exercises, stretches and lifestyle modifications  See medications in patient instructions if given  Patient will follow up in 4-8 weeks 

## 2018-12-01 NOTE — Patient Instructions (Signed)
Good to see you  You are doing well  Can read about effexor or cymbalta for pain relief at low doses but you are doing good.  Nexium 2 times a day for 2 weeks if we think some could be reflux See me again in 5-6 weeks

## 2018-12-05 ENCOUNTER — Ambulatory Visit: Payer: BLUE CROSS/BLUE SHIELD | Admitting: Rheumatology

## 2018-12-15 ENCOUNTER — Telehealth (INDEPENDENT_AMBULATORY_CARE_PROVIDER_SITE_OTHER): Payer: Self-pay

## 2018-12-15 NOTE — Telephone Encounter (Signed)
Submitted VOB for Synvisc series, bilateral knee. 

## 2019-01-18 NOTE — Progress Notes (Deleted)
[name] [address] [place], [place] [address] Phone: [phone] Subjective:    I'm seeing this patient by the request  of:    CC:   ACZ:[id number]  Wendy [name] is a 53 y.o. female coming in with complaint of ***  Onset-  Location Duration-  Character- Aggravating factors- Reliving factors-  Therapies tried-  Severity-     Past Medical History:  Diagnosis Date  . Anal fissure 2004   Tx'd with diltiazem  gel by Dr. Kendrick [name]  . Arthritis   . Bronchitis, acute 2008  . Seasonal allergies   . Thyroid disease   . URI 10/12/2007   Qualifier: Diagnosis of  By: Wendy Ren MD, Wendy [name]     Past Surgical History:  Procedure Laterality Date  . BREAST SURGERY  12/15/2017   breast reduction    Social History   Socioeconomic History  . Marital status: Married    Spouse name: Not on file  . Number of children: Not on file  . Years of education: Not on file  . Highest education level: Not on file  Occupational History  . Not on file  Social Needs  . Financial resource strain: Not on file  . Food insecurity:    Worry: Not on file    Inability: Not on file  . Transportation needs:    Medical: Not on file    Non-medical: Not on file  Tobacco Use  . Smoking status: Never Smoker  . Smokeless tobacco: Never Used  Substance and Sexual Activity  . Alcohol use: Yes    Comment: occ  . Drug use: No  . Sexual activity: Not on file  Lifestyle  . Physical activity:    Days per week: Not on file    Minutes per session: Not on file  . Stress: Not on file  Relationships  . Social connections:    Talks on phone: Not on file    Gets together: Not on file    Attends religious service: Not on file    Active member of club or organization: Not on file    Attends meetings of clubs or organizations: Not on file    Relationship status: Not on file  Other Topics Concern  . Not on file  Social History Narrative  . Not on file   Allergies    Allergen Reactions  . Pollen Extract   . Arava [Leflunomide] Other (See Comments)    Hair loss  . Methotrexate Derivatives Nausea Only   Family History  Problem Relation Age of Onset  . Hypertension Mother   . Diabetes Mother     Current Outpatient Medications (Endocrine & Metabolic):  .  levothyroxine (SYNTHROID, LEVOTHROID) 75 MCG tablet,    Current Outpatient Medications (Respiratory):  .  levocetirizine (XYZAL) 5 MG tablet, Take 5 mg by mouth daily.   Current Outpatient Medications (Hematological):  Marland Kitchen  Cyanocobalamin (VITAMIN B 12 PO), Take by mouth.  Current Outpatient Medications (Other):  Marland Kitchen  Calcium Carbonate-Vit D-Min (CALCIUM 1200 PO), Take by mouth. .  COSENTYX SENSOREADY, 300 MG, 150 MG/ML SOAJ, INJECT TWO PENS SUBCUTANEOUSLY EVERY 4 WEEKS. REFRIGERATE. ALLOW 15 TO30 MINUTES AT ROOM TEMP PRIOR TO ADMINISTRATION. .  Diclofenac Sodium 2 % SOLN, Place 2 g onto the skin 2 (two) times daily. .  Vitamin D, Ergocalciferol, (DRISDOL) 1.25 MG (50000 UT) CAPS capsule, Take 1 capsule (50,000 Units total) by mouth every 30 (thirty) days.    Past medical history,  social, surgical and family history all reviewed in electronic medical record.  No pertanent information unless stated regarding to the chief complaint.   Review of Systems:  No headache, visual changes, nausea, vomiting, diarrhea, constipation, dizziness, abdominal pain, skin rash, fevers, chills, night sweats, weight loss, swollen lymph nodes, body aches, joint swelling, muscle aches, chest pain, shortness of breath, mood changes.   Objective  There were no vitals taken for this visit. Systems examined below as of    General: No apparent distress alert and oriented x3 mood and affect normal, dressed appropriately.  HEENT: Pupils equal, extraocular movements intact  Respiratory: Patient's speak in full sentences and does not appear short of breath  Cardiovascular: No lower extremity edema, non tender, no erythema   Skin: Warm dry intact with no signs of infection or rash on extremities or on axial skeleton.  Abdomen: Soft nontender  Neuro: Cranial nerves II through XII are intact, neurovascularly intact in all extremities with 2+ DTRs and 2+ pulses.  Lymph: No lymphadenopathy of posterior or anterior cervical chain or axillae bilaterally.  Gait normal with good balance and coordination.  MSK:  Non tender with full range of motion and good stability and symmetric strength and tone of shoulders, elbows, wrist, hip, knee and ankles bilaterally.     Impression and Recommendations:     This case required medical decision making of moderate complexity. The above documentation has been reviewed and is accurate and complete Wendy Saa, DO       Note: This dictation was prepared with Dragon dictation along with smaller phrase technology. Any transcriptional errors that result from this process are unintentional.

## 2019-01-19 ENCOUNTER — Ambulatory Visit: Payer: BLUE CROSS/BLUE SHIELD | Admitting: Family Medicine

## 2019-02-07 DIAGNOSIS — D508 Other iron deficiency anemias: Secondary | ICD-10-CM | POA: Diagnosis not present

## 2019-02-07 DIAGNOSIS — E559 Vitamin D deficiency, unspecified: Secondary | ICD-10-CM | POA: Diagnosis not present

## 2019-02-07 DIAGNOSIS — E038 Other specified hypothyroidism: Secondary | ICD-10-CM | POA: Diagnosis not present

## 2019-02-07 DIAGNOSIS — D649 Anemia, unspecified: Secondary | ICD-10-CM | POA: Diagnosis not present

## 2019-02-07 DIAGNOSIS — E538 Deficiency of other specified B group vitamins: Secondary | ICD-10-CM | POA: Diagnosis not present

## 2019-02-08 ENCOUNTER — Encounter (INDEPENDENT_AMBULATORY_CARE_PROVIDER_SITE_OTHER): Payer: Self-pay | Admitting: Radiology

## 2019-02-08 NOTE — Progress Notes (Unsigned)
Can you please call patient and sched Synvisc series bilateral knees for patient?  Insurance approved auth # [id number], valid 02/06/2019 through 02/06/2020.  Patient will have a $30 copay each visit, Deveshwar patient.  Thanks.

## 2019-02-10 ENCOUNTER — Telehealth: Payer: Self-pay | Admitting: Rheumatology

## 2019-02-10 NOTE — Telephone Encounter (Signed)
LMOM for patient to call and schedule Synvisc injections °

## 2019-02-10 NOTE — Progress Notes (Unsigned)
LMOM for patient to call and schedule Synvisc injections °

## 2019-02-13 DIAGNOSIS — E559 Vitamin D deficiency, unspecified: Secondary | ICD-10-CM | POA: Diagnosis not present

## 2019-02-13 DIAGNOSIS — E538 Deficiency of other specified B group vitamins: Secondary | ICD-10-CM | POA: Diagnosis not present

## 2019-02-13 DIAGNOSIS — Z1331 Encounter for screening for depression: Secondary | ICD-10-CM | POA: Diagnosis not present

## 2019-02-13 DIAGNOSIS — M722 Plantar fascial fibromatosis: Secondary | ICD-10-CM | POA: Diagnosis not present

## 2019-02-13 DIAGNOSIS — D508 Other iron deficiency anemias: Secondary | ICD-10-CM | POA: Diagnosis not present

## 2019-02-13 DIAGNOSIS — Z0000 Encounter for general adult medical examination without abnormal findings: Secondary | ICD-10-CM | POA: Diagnosis not present

## 2019-02-13 DIAGNOSIS — R0683 Snoring: Secondary | ICD-10-CM | POA: Diagnosis not present

## 2019-02-14 DIAGNOSIS — Z1212 Encounter for screening for malignant neoplasm of rectum: Secondary | ICD-10-CM | POA: Diagnosis not present

## 2019-03-01 ENCOUNTER — Telehealth: Payer: Self-pay

## 2019-03-01 NOTE — Telephone Encounter (Signed)
Patient called yesterday after 5pm questioning if she could take advil with cosentyx. Patient was advised not to take advil currently due to the coronavirus and the recommendation is tylenol. Patient verbalized understanding.

## 2019-03-07 DIAGNOSIS — R05 Cough: Secondary | ICD-10-CM | POA: Diagnosis not present

## 2019-03-07 DIAGNOSIS — J0100 Acute maxillary sinusitis, unspecified: Secondary | ICD-10-CM | POA: Diagnosis not present

## 2019-03-07 DIAGNOSIS — J309 Allergic rhinitis, unspecified: Secondary | ICD-10-CM | POA: Diagnosis not present

## 2019-03-07 DIAGNOSIS — L4050 Arthropathic psoriasis, unspecified: Secondary | ICD-10-CM | POA: Diagnosis not present

## 2019-04-07 DIAGNOSIS — E559 Vitamin D deficiency, unspecified: Secondary | ICD-10-CM | POA: Diagnosis not present

## 2019-04-07 DIAGNOSIS — D509 Iron deficiency anemia, unspecified: Secondary | ICD-10-CM | POA: Diagnosis not present

## 2019-04-07 DIAGNOSIS — L659 Nonscarring hair loss, unspecified: Secondary | ICD-10-CM | POA: Diagnosis not present

## 2019-04-19 DIAGNOSIS — J3081 Allergic rhinitis due to animal (cat) (dog) hair and dander: Secondary | ICD-10-CM | POA: Diagnosis not present

## 2019-04-19 DIAGNOSIS — J301 Allergic rhinitis due to pollen: Secondary | ICD-10-CM | POA: Diagnosis not present

## 2019-04-19 DIAGNOSIS — T783XXD Angioneurotic edema, subsequent encounter: Secondary | ICD-10-CM | POA: Diagnosis not present

## 2019-04-19 DIAGNOSIS — J3089 Other allergic rhinitis: Secondary | ICD-10-CM | POA: Diagnosis not present

## 2019-04-20 ENCOUNTER — Other Ambulatory Visit: Payer: Self-pay | Admitting: Rheumatology

## 2019-04-20 DIAGNOSIS — L4050 Arthropathic psoriasis, unspecified: Secondary | ICD-10-CM

## 2019-04-20 NOTE — Telephone Encounter (Signed)
ok 

## 2019-04-20 NOTE — Telephone Encounter (Signed)
Last Visit: 11/18/19 Next visit: 06/07/19 Labs: 11/11/18 WNL TB Gold: 11/11/18 Neg   Left message to advise patient she is due for labs.  Okay to refill 30 day supply Cosentyx?

## 2019-04-21 NOTE — Progress Notes (Deleted)
Office Visit Note  [name]             Date of Birth: Apr 16, 1966           MRN: [id number]             PCP: Rodrigo RanPerini, Mark, MD Referring: Rodrigo RanPerini, Mark, MD Visit Date: 04/25/2019 Occupation: @GUAROCC @  Subjective:  No chief complaint on file.   History of Present Illness: Wendy [name] is a 53 y.o. female ***   Activities of Daily Living:  Patient reports morning stiffness for *** {minute/hour:19697}.   Patient {ACTIONS;DENIES/REPORTS:21021675::"Denies"} nocturnal pain.  Difficulty dressing/grooming: {ACTIONS;DENIES/REPORTS:21021675::"Denies"} Difficulty climbing stairs: {ACTIONS;DENIES/REPORTS:21021675::"Denies"} Difficulty getting out of chair: {ACTIONS;DENIES/REPORTS:21021675::"Denies"} Difficulty using hands for taps, buttons, cutlery, and/or writing: {ACTIONS;DENIES/REPORTS:21021675::"Denies"}  No Rheumatology ROS completed.   PMFS History:  Patient Active Problem List   Diagnosis Date Noted  . Nonallopathic lesion of lumbosacral region 11/04/2018  . Nonallopathic lesion of sacral region 11/04/2018  . Nonallopathic lesion of thoracic region 11/04/2018  . Psoriasis 07/05/2017  . DJD (degenerative joint disease), cervical 02/03/2017  . Spondylosis of lumbar region without myelopathy or radiculopathy 02/03/2017  . High risk medication use 10/06/2016  . Plantar fasciitis 10/06/2016  . Achilles tendinitis of both lower extremities 10/06/2016  . Vitamin D deficiency 10/06/2016  . Primary osteoarthritis of both knees 10/06/2016  . Primary osteoarthritis of both feet 10/06/2016  . Chronic low back pain without sciatica 10/06/2016  . Hypothyroidism 10/06/2016  . Polyp of colon 10/06/2016  . B12 deficiency 10/06/2016  . 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 Anal fissure 04/11/2012    Past Medical History:  Diagnosis Date  . Anal fissure 2004   Tx'd with diltiazem  gel by Dr. Kendrick Ranchim Davis  . Arthritis   . Bronchitis, acute 2008  . Seasonal allergies   .  Thyroid disease   . URI 10/12/2007   Qualifier: Diagnosis of  By: Alwyn RenHopper MD, Barbee ShropshireWilliam      Family History  Problem Relation Age of Onset  . Hypertension Mother   . Diabetes Mother    Past Surgical History:  Procedure Laterality Date  . BREAST SURGERY  12/15/2017   breast reduction    Social History   Social History Narrative  . Not on file   Immunization History  Administered Date(s) Administered  . Pneumococcal Polysaccharide-23 10/28/2010     Objective: Vital Signs: There were no vitals taken for this visit.   Physical Exam   Musculoskeletal Exam: ***  CDAI Exam: CDAI Score: Not documented Patient Global Assessment: Not documented; Provider Global Assessment: Not documented Swollen: Not documented; Tender: Not documented Joint Exam   Not documented   There is currently no information documented on the homunculus. Go to the Rheumatology activity and complete the homunculus joint exam.  Investigation: No additional findings.  Imaging: No results found.  Recent Labs: Lab Results  Component Value Date   WBC 6.6 11/11/2018   HGB 13.4 11/11/2018   PLT 197 11/11/2018   NA 140 11/11/2018   K 4.3 11/11/2018   CL 104 11/11/2018   CO2 29 11/11/2018   GLUCOSE 86 11/11/2018   BUN 12 11/11/2018   CREATININE 0.90 11/11/2018   BILITOT 0.4 11/11/2018   ALKPHOS 77 05/27/2017   AST 19 11/11/2018   ALT 20 11/11/2018   PROT 7.6 11/11/2018   ALBUMIN 3.9 05/27/2017   CALCIUM 9.8 11/11/2018   GFRAA 85 11/11/2018   QFTBGOLDPLUS NEGATIVE 11/11/2018    Speciality Comments: No specialty comments available.  Procedures:  No procedures performed Allergies: Pollen extract; Arava [leflunomide]; and Methotrexate derivatives   Assessment / Plan:     Visit Diagnoses: No diagnosis found.   Orders: No orders of the defined types were placed in this encounter.  No orders of the defined types were placed in this encounter.   Face-to-face time spent with patient was ***  minutes. Greater than 50% of time was spent in counseling and coordination of care.  Follow-Up Instructions: No follow-ups on file.   Gearldine Bienenstock, PA-C  Note - This record has been created using Dragon software.  Chart creation errors have been sought, but may not always  have been located. Such creation errors do not reflect on  the standard of medical care.

## 2019-04-25 ENCOUNTER — Ambulatory Visit: Payer: BLUE CROSS/BLUE SHIELD | Admitting: Rheumatology

## 2019-04-25 DIAGNOSIS — Z79899 Other long term (current) drug therapy: Secondary | ICD-10-CM | POA: Diagnosis not present

## 2019-04-25 DIAGNOSIS — L650 Telogen effluvium: Secondary | ICD-10-CM | POA: Diagnosis not present

## 2019-04-25 DIAGNOSIS — L659 Nonscarring hair loss, unspecified: Secondary | ICD-10-CM | POA: Diagnosis not present

## 2019-04-26 ENCOUNTER — Other Ambulatory Visit: Payer: Self-pay

## 2019-04-26 ENCOUNTER — Ambulatory Visit (INDEPENDENT_AMBULATORY_CARE_PROVIDER_SITE_OTHER): Payer: BLUE CROSS/BLUE SHIELD | Admitting: Family Medicine

## 2019-04-26 ENCOUNTER — Encounter: Payer: Self-pay | Admitting: Family Medicine

## 2019-04-26 DIAGNOSIS — M47816 Spondylosis without myelopathy or radiculopathy, lumbar region: Secondary | ICD-10-CM | POA: Diagnosis not present

## 2019-04-26 DIAGNOSIS — E039 Hypothyroidism, unspecified: Secondary | ICD-10-CM | POA: Diagnosis not present

## 2019-04-26 DIAGNOSIS — M999 Biomechanical lesion, unspecified: Secondary | ICD-10-CM

## 2019-04-26 MED ORDER — THYROID 15 MG PO TABS: 15.0000 mg | ORAL_TABLET | Freq: Every day | ORAL | 1 refills | Status: DC

## 2019-04-26 NOTE — Assessment & Plan Note (Signed)
Decision today to treat with OMT was based on Physical Exam  After verbal consent patient was treated with HVLA, ME, FPR techniques in  thoracic, lumbar and sacral areas  Patient tolerated the procedure well with improvement in symptoms  Patient given exercises, stretches and lifestyle modifications  See medications in patient instructions if given  Patient will follow up in 4-8 weeks 

## 2019-04-26 NOTE — Progress Notes (Signed)
Tawana ScaleZach  D.O. [facility] [address] [place], [place] [address] Phone: [phone] Subjective:    I'm seeing this patient by the request  of:    CC: Back pain follow-up  OZH:[id number]:Subjective  Wendy BurkeGita N [name] is a 53 y.o. female coming in with complaint of back pain. Has been having tightness in her muscles and achiness in her back. Complains of a tightness and inflammation in all of her joints. Also notes that her hair is falling out. Did have labs done with her dermatologist yesterday.  Patient has responded fairly well to manipulation previously.  Having more discomfort and pain.  History of psoriatic arthritis as well.    Past Medical History:  Diagnosis Date  . Anal fissure 2004   Tx'd with diltiazem  gel by Dr. Kendrick Ranchim Davis  . Arthritis   . Bronchitis, acute 2008  . Seasonal allergies   . Thyroid disease   . URI 10/12/2007   Qualifier: Diagnosis of  By: Alwyn RenHopper MD, Chrissie NoaWilliam     Past Surgical History:  Procedure Laterality Date  . BREAST SURGERY  12/15/2017   breast reduction    Social History   Socioeconomic History  . Marital status: Married    Spouse name: Not on file  . Number of children: Not on file  . Years of education: Not on file  . Highest education level: Not on file  Occupational History  . Not on file  Social Needs  . Financial resource strain: Not on file  . Food insecurity:    Worry: Not on file    Inability: Not on file  . Transportation needs:    Medical: Not on file    Non-medical: Not on file  Tobacco Use  . Smoking status: Never Smoker  . Smokeless tobacco: Never Used  Substance and Sexual Activity  . Alcohol use: Yes    Comment: occ  . Drug use: No  . Sexual activity: Not on file  Lifestyle  . Physical activity:    Days per week: Not on file    Minutes per session: Not on file  . Stress: Not on file  Relationships  . Social connections:    Talks on phone: Not on file    Gets together: Not on file    Attends religious  service: Not on file    Active member of club or organization: Not on file    Attends meetings of clubs or organizations: Not on file    Relationship status: Not on file  Other Topics Concern  . Not on file  Social History Narrative  . Not on file   Allergies  Allergen Reactions  . Pollen Extract   . Arava [Leflunomide] Other (See Comments)    Hair loss  . Methotrexate Derivatives Nausea Only   Family History  Problem Relation Age of Onset  . Hypertension Mother   . Diabetes Mother     Current Outpatient Medications (Endocrine & Metabolic):  .  levothyroxine (SYNTHROID, LEVOTHROID) 75 MCG tablet,  .  thyroid (ARMOUR THYROID) 15 MG tablet, Take 1 tablet (15 mg total) by mouth daily.   Current Outpatient Medications (Respiratory):  .  levocetirizine (XYZAL) 5 MG tablet, Take 5 mg by mouth daily.   Current Outpatient Medications (Hematological):  Marland Kitchen.  Cyanocobalamin (VITAMIN B 12 PO), Take by mouth.  Current Outpatient Medications (Other):  Marland Kitchen.  Calcium Carbonate-Vit D-Min (CALCIUM 1200 PO), Take by mouth. .  COSENTYX SENSOREADY, 300 MG, 150 MG/ML SOAJ, INJECT TWO  PENS SUBCUTANEOUSLY EVERY 4 WEEKS. REFRIGERATE. ALLOW 15 TO30 MINUTES AT ROOM TEMP PRIOR TO ADMINISTRATION. .  Diclofenac Sodium 2 % SOLN, Place 2 g onto the skin 2 (two) times daily. .  Vitamin D, Ergocalciferol, (DRISDOL) 1.25 MG (50000 UT) CAPS capsule, Take 1 capsule (50,000 Units total) by mouth every 30 (thirty) days.    Past medical history, social, surgical and family history all reviewed in electronic medical record.  No pertanent information unless stated regarding to the chief complaint.   Review of Systems:  No headache, visual changes, nausea, vomiting, diarrhea, constipation, dizziness, abdominal pain, skin rash, fevers, chills, night sweats, weight loss, swollen lymph nodes, body aches, joint swelling, , chest pain, shortness of breath, mood changes.  Positive muscle aches  Objective  Blood pressure  110/72, pulse 76, height 5\' 2"  (1.575 m), weight 153 lb (69.4 kg), SpO2 98 %.    General: No apparent distress alert and oriented x3 mood and affect normal, dressed appropriately.  HEENT: Pupils equal, extraocular movements intact  Respiratory: Patient's speak in full sentences and does not appear short of breath  Cardiovascular: No lower extremity edema, non tender, no erythema  Skin: Warm dry intact with no signs of infection or rash on extremities or on axial skeleton.  Abdomen: Soft nontender  Neuro: Cranial nerves II through XII are intact, neurovascularly intact in all extremities with 2+ DTRs and 2+ pulses.  Lymph: No lymphadenopathy of posterior or anterior cervical chain or axillae bilaterally.  Gait normal with good balance and coordination.  MSK:  Non tender with full range of motion and good stability and symmetric strength and tone of shoulders, elbows, wrist, hip, knee and ankles bilaterally.  Back pain show some loss of lordosis.  Severe tenderness to palpation in the paraspinal musculature of the lumbar spine bilaterally.  Tightness of the sacroiliac joint bilaterally.  Negative straight leg test.  No pain in the midline.  Does have some limited range of motion of 5 to 10 degrees in all planes.  Osteopathic findings   T9 extended rotated and side bent left L2 flexed rotated and side bent right Sacrum right on right     Impression and Recommendations:     This case required medical decision making of moderate complexity. The above documentation has been reviewed and is accurate and complete Judi Saa, DO       Note: This dictation was prepared with Dragon dictation along with smaller phrase technology. Any transcriptional errors that result from this process are unintentional.

## 2019-04-26 NOTE — Assessment & Plan Note (Signed)
History of spondylosis.  Does respond well to manipulation.  Discussed posture and ergonomics.  Discussed which activities of doing which wants to avoid.  Patient is to increase activity slowly over the course the next several days.  Follow-up again 4 weeks.

## 2019-04-26 NOTE — Assessment & Plan Note (Signed)
History of hypothyroidism with increasing hair loss, fatigue, as well as weight gain.  TSH is started to slowly go up.  And at this point I would like to add Armour and see how patient responds.  May need to decrease the Synthroid.  Patient is in agreement with the plan.

## 2019-04-26 NOTE — Patient Instructions (Signed)
Good to see you  Ice is your friend Armour 15mg  daily and lets see how you feel.  DHEA 50 mg daily (puritan pride or Josefa Half brand)\ Iron 65mg  with 500mg  of vitamin C  See me again in 4 weeks to see how you feel and may need to discuss a sleep study

## 2019-04-27 NOTE — Progress Notes (Signed)
Office Visit Note  [name]             Date of Birth: [birth year]           MRN: [id number]             PCP: Rodrigo Ran, MD Referring: Rodrigo Ran, MD Visit Date: 05/11/2019 Occupation: @GUAROCC @  Subjective:  Pain in multiple joints and muscles    History of Present Illness: Wendy [name] is a 53 y.o. female with history of psoriatic arthritis, psoriasis and osteoarthritis.  She states recently she has been experiencing increased pain and discomfort all over.  She states she has pain in her joints and muscles.  None of the joints have been swollen.  She is very stiff in the morning.  She has difficulty making a fist.  She also has has been experiencing some numbness in her right hand.  She has discomfort in her bilateral knee joints and bilateral feet.  She states that she has been experiencing some hair loss.  She was seen by dermatologist and had TSH which was normal.  Activities of Daily Living:  Patient reports morning stiffness for 30 minutes.   Patient Reports nocturnal pain.  Difficulty dressing/grooming: Reports Difficulty climbing stairs: Reports Difficulty getting out of chair: Denies Difficulty using hands for taps, buttons, cutlery, and/or writing: Reports  Review of Systems  Constitutional: Positive for fatigue. Negative for night sweats, weight gain and weight loss.  HENT: Negative for mouth sores, trouble swallowing, trouble swallowing, mouth dryness and nose dryness.   Eyes: Negative for pain, redness, visual disturbance and dryness.  Respiratory: Negative for cough, shortness of breath, wheezing and difficulty breathing.   Cardiovascular: Negative for chest pain, palpitations, hypertension, irregular heartbeat and swelling in legs/feet.  Gastrointestinal: Negative for abdominal pain, blood in stool, constipation and diarrhea.  Endocrine: Negative for excessive thirst and increased urination.  Genitourinary: Negative for difficulty urinating and  vaginal dryness.  Musculoskeletal: Positive for joint swelling, myalgias, morning stiffness and myalgias. Negative for arthralgias, joint pain, muscle weakness and muscle tenderness.  Skin: Negative for color change, rash, hair loss, redness, skin tightness, ulcers and sensitivity to sunlight.  Allergic/Immunologic: Positive for susceptible to infections.  Neurological: Negative for dizziness, headaches, memory loss, night sweats and weakness.  Hematological: Negative for bruising/bleeding tendency and swollen glands.  Psychiatric/Behavioral: Positive for sleep disturbance. Negative for depressed mood. The patient is not nervous/anxious.     PMFS History:  Patient Active Problem List   Diagnosis Date Noted   Nonallopathic lesion of lumbosacral region 11/04/2018   Nonallopathic lesion of sacral region 11/04/2018   Nonallopathic lesion of thoracic region 11/04/2018   Psoriasis 07/05/2017   DJD (degenerative joint disease), cervical 02/03/2017   Spondylosis of lumbar region without myelopathy or radiculopathy 02/03/2017   High risk medication use 10/06/2016   Plantar fasciitis 10/06/2016   Achilles tendinitis of both lower extremities 10/06/2016   Vitamin D deficiency 10/06/2016   Primary osteoarthritis of both knees 10/06/2016   Primary osteoarthritis of both feet 10/06/2016   Chronic low back pain without sciatica 10/06/2016   Hypothyroidism 10/06/2016   Polyp of colon 10/06/2016   B12 deficiency 10/06/2016   Psoriatic arthropathy (HCC) 04/11/2012   Anal fissure 04/11/2012    Past Medical History:  Diagnosis Date   Anal fissure 2004   Tx'd with diltiazem  gel by Dr. Kendrick Ranch   Arthritis    Bronchitis, acute 2008   Seasonal allergies    Thyroid disease  URI 10/12/2007   Qualifier: Diagnosis of  By: Alwyn RenHopper MD, Barbee ShropshireWilliam      Family History  Problem Relation Age of Onset   Hypertension Mother    Diabetes Mother    Past Surgical History:    Procedure Laterality Date   BREAST SURGERY  12/15/2017   breast reduction    Social History   Social History Narrative   Not on file   Immunization History  Administered Date(s) Administered   Pneumococcal Polysaccharide-23 10/28/2010   Zoster Recombinat (Shingrix) 11/16/2018, 01/30/2019     Objective: Vital Signs: BP 119/83 (BP Location: Left Arm, Patient Position: Sitting, Cuff Size: Normal)    Pulse 70    Resp 11    Ht 5\' 2"  (1.575 m)    Wt 159 lb (72.1 kg)    BMI 29.08 kg/m    Physical Exam Vitals signs and nursing note reviewed.  Constitutional:      Appearance: She is well-developed.  HENT:     Head: Normocephalic and atraumatic.  Eyes:     Conjunctiva/sclera: Conjunctivae normal.  Neck:     Musculoskeletal: Normal range of motion.  Cardiovascular:     Rate and Rhythm: Normal rate and regular rhythm.     Heart sounds: Normal heart sounds.  Pulmonary:     Effort: Pulmonary effort is normal.     Breath sounds: Normal breath sounds.  Abdominal:     General: Bowel sounds are normal.     Palpations: Abdomen is soft.  Lymphadenopathy:     Cervical: No cervical adenopathy.  Skin:    General: Skin is warm and dry.     Capillary Refill: Capillary refill takes less than 2 seconds.  Neurological:     Mental Status: She is alert and oriented to person, place, and time.  Psychiatric:        Behavior: Behavior normal.      Musculoskeletal Exam: C-spine good range of motion.  Thoracic and lumbar spine good range of motion.  She has some discomfort range of motion of her lumbar spine.  No SI joint tenderness was noted.  Shoulder joints elbow joints wrist joint MCPs PIPs DIPs with good range of motion with no synovitis.  Hip joints knee joints ankles MTPs PIPs with good range of motion.  She has tenderness across midfoot.  She also had discomfort range of motion of bilateral knee joints.  No warmth swelling or effusion was noted.  CDAI Exam: CDAI Score: -- Patient  Global: --; Provider Global: -- Swollen: --; Tender: -- Joint Exam   No joint exam has been documented for this visit   There is currently no information documented on the homunculus. Go to the Rheumatology activity and complete the homunculus joint exam.  Investigation: No additional findings.  Imaging: No results found.  Recent Labs: Lab Results  Component Value Date   WBC 7.8 05/09/2019   HGB 12.9 05/09/2019   PLT 196 05/09/2019   NA 141 05/09/2019   K 4.0 05/09/2019   CL 106 05/09/2019   CO2 25 05/09/2019   GLUCOSE 92 05/09/2019   BUN 13 05/09/2019   CREATININE 0.89 05/09/2019   BILITOT 0.3 05/09/2019   ALKPHOS 77 05/27/2017   AST 22 05/09/2019   ALT 28 05/09/2019   PROT 7.0 05/09/2019   ALBUMIN 3.9 05/27/2017   CALCIUM 9.4 05/09/2019   GFRAA 86 05/09/2019   QFTBGOLDPLUS NEGATIVE 11/11/2018    Speciality Comments: No specialty comments available.  Procedures:  No procedures performed Allergies: Pollen  extract, Arava [leflunomide], and Methotrexate derivatives   Assessment / Plan:     Visit Diagnoses: Psoriatic arthritis (HCC) -patient has no synovitis on examination.  She complains of pain in multiple joints.  She believes the Cosentyx is not working.  Her psoriasis and psoriatic arthritis seems to be quite well controlled on my examination.  Plan: NM Bone Scan Whole Body, to rule out synovitis.  Psoriasis -she has no active psoriasis patches currently.  High risk medication use - She is taking Cosentyx 300 mg every 28 days.  Last TB gold negative on 11/11/2018 and will monitor yearly.  Most recent CBC/CMP with in normal limits on May 09, 2019.  Due for CBC/CMP today and will monitor every 3 months.  Standing orders are in place.  She received Pneumovax 23 and Shingrix vaccines.  Recommend annual flu and Prevnar 13 vaccines as indicated.  Myalgia - Plan: CK, Sedimentation rate, NM Bone Scan Whole Body, I am concerned that she may have myofascial pain syndrome.   We have discussed Cymbalta in the past.  She had good response to tizanidine in the past.  I will give her prescription for tizanidine.  Primary osteoarthritis of both knees -she complains of increased knee joint pain and discomfort.  No warmth swelling or effusion was noted.  Primary osteoarthritis of both feet - Plan: NM Bone Scan Whole Body, she has been experiencing increased feet discomfort.  She had tenderness in the midfoot.  But no synovitis was noted.    Other fatigue -probably related to generalized pain and discomfort.  Sacroiliac pain -she continues to have mild SI joint discomfort.  She has significant stiffness in the morning.  Plan: NM Bone Scan Whole Body  Spondylosis of lumbar region without myelopathy or radiculopathy -chronic pain  DDD (degenerative disc disease), cervical -I reviewed MRI of her cervical spine from the past.  She has degenerative disc disease with moderate spinal stenosis.  History of hypothyroidism -according to patient her most recent TSH done by dermatologist was normal.  History of vitamin D deficiency -she is on supplement.  Orders: Orders Placed This Encounter  Procedures   NM Bone Scan Whole Body   CK   Sedimentation rate   Meds ordered this encounter  Medications   tiZANidine (ZANAFLEX) 4 MG tablet    Sig: Take 1 tablet (4 mg total) by mouth at bedtime.    Dispense:  30 tablet    Refill:  2    Face-to-face time spent with patient was 30 minutes. Greater than 50% of time was spent in counseling and coordination of care.  Follow-Up Instructions: Return in about 5 months (around 10/11/2019) for Psoriatic arthritis.   Pollyann Savoy, MD  Note - This record has been created using Animal nutritionist.  Chart creation errors have been sought, but may not always  have been located. Such creation errors do not reflect on  the standard of medical care.

## 2019-05-01 ENCOUNTER — Encounter: Payer: Self-pay | Admitting: Family Medicine

## 2019-05-02 ENCOUNTER — Ambulatory Visit: Payer: BLUE CROSS/BLUE SHIELD | Admitting: Family Medicine

## 2019-05-09 ENCOUNTER — Other Ambulatory Visit: Payer: Self-pay | Admitting: *Deleted

## 2019-05-09 DIAGNOSIS — Z79899 Other long term (current) drug therapy: Secondary | ICD-10-CM

## 2019-05-10 LAB — CBC WITH DIFFERENTIAL/PLATELET
Absolute Monocytes: 577 cells/uL (ref 200–950)
Basophils Absolute: 78 cells/uL (ref 0–200)
Basophils Relative: 1 %
Eosinophils Absolute: 406 cells/uL (ref 15–500)
Eosinophils Relative: 5.2 %
HCT: 39 % (ref 35.0–45.0)
Hemoglobin: 12.9 g/dL (ref 11.7–15.5)
Lymphs Abs: 2535 cells/uL (ref 850–3900)
MCH: 28.4 pg (ref 27.0–33.0)
MCHC: 33.1 g/dL (ref 32.0–36.0)
MCV: 85.9 fL (ref 80.0–100.0)
MPV: 12.6 fL — ABNORMAL HIGH (ref 7.5–12.5)
Monocytes Relative: 7.4 %
Neutro Abs: 4204 cells/uL (ref 1500–7800)
Neutrophils Relative %: 53.9 %
Platelets: 196 10*3/uL (ref 140–400)
RBC: 4.54 10*6/uL (ref 3.80–5.10)
RDW: 13.5 % (ref 11.0–15.0)
Total Lymphocyte: 32.5 %
WBC: 7.8 10*3/uL (ref 3.8–10.8)

## 2019-05-10 LAB — COMPLETE METABOLIC PANEL WITH GFR
AG Ratio: 1.4 (calc) (ref 1.0–2.5)
ALT: 28 U/L (ref 6–29)
AST: 22 U/L (ref 10–35)
Albumin: 4.1 g/dL (ref 3.6–5.1)
Alkaline phosphatase (APISO): 81 U/L (ref 37–153)
BUN: 13 mg/dL (ref 7–25)
CO2: 25 mmol/L (ref 20–32)
Calcium: 9.4 mg/dL (ref 8.6–10.4)
Chloride: 106 mmol/L (ref 98–110)
Creat: 0.89 mg/dL (ref 0.50–1.05)
GFR, Est African American: 86 mL/min/{1.73_m2} (ref >=60)
GFR, Est Non African American: 74 mL/min/{1.73_m2} (ref >=60)
Globulin: 2.9 g/dL (calc) (ref 1.9–3.7)
Glucose, Bld: 92 mg/dL (ref 65–99)
Potassium: 4 mmol/L (ref 3.5–5.3)
Sodium: 141 mmol/L (ref 135–146)
Total Bilirubin: 0.3 mg/dL (ref 0.2–1.2)
Total Protein: 7 g/dL (ref 6.1–8.1)

## 2019-05-11 ENCOUNTER — Ambulatory Visit: Payer: BLUE CROSS/BLUE SHIELD | Admitting: Rheumatology

## 2019-05-11 ENCOUNTER — Other Ambulatory Visit: Payer: Self-pay

## 2019-05-11 ENCOUNTER — Encounter: Payer: Self-pay | Admitting: Rheumatology

## 2019-05-11 VITALS — BP 119/83 | HR 70 | Resp 11 | Ht 62.0 in | Wt 159.0 lb

## 2019-05-11 DIAGNOSIS — M47816 Spondylosis without myelopathy or radiculopathy, lumbar region: Secondary | ICD-10-CM

## 2019-05-11 DIAGNOSIS — M19071 Primary osteoarthritis, right ankle and foot: Secondary | ICD-10-CM

## 2019-05-11 DIAGNOSIS — L409 Psoriasis, unspecified: Secondary | ICD-10-CM

## 2019-05-11 DIAGNOSIS — M5030 Other cervical disc degeneration, unspecified cervical region: Secondary | ICD-10-CM

## 2019-05-11 DIAGNOSIS — Z79899 Other long term (current) drug therapy: Secondary | ICD-10-CM | POA: Diagnosis not present

## 2019-05-11 DIAGNOSIS — Z8639 Personal history of other endocrine, nutritional and metabolic disease: Secondary | ICD-10-CM

## 2019-05-11 DIAGNOSIS — M19072 Primary osteoarthritis, left ankle and foot: Secondary | ICD-10-CM

## 2019-05-11 DIAGNOSIS — R5383 Other fatigue: Secondary | ICD-10-CM

## 2019-05-11 DIAGNOSIS — M170 Bilateral primary osteoarthritis of knee: Secondary | ICD-10-CM

## 2019-05-11 DIAGNOSIS — L4050 Arthropathic psoriasis, unspecified: Secondary | ICD-10-CM

## 2019-05-11 DIAGNOSIS — M533 Sacrococcygeal disorders, not elsewhere classified: Secondary | ICD-10-CM

## 2019-05-11 DIAGNOSIS — M791 Myalgia, unspecified site: Secondary | ICD-10-CM

## 2019-05-11 MED ORDER — TIZANIDINE HCL 4 MG PO TABS: 4.0000 mg | ORAL_TABLET | Freq: Every day | ORAL | 2 refills | Status: DC

## 2019-05-12 LAB — SEDIMENTATION RATE: Sed Rate: 9 mm/h (ref 0–30)

## 2019-05-12 LAB — CK: Total CK: 98 U/L (ref 29–143)

## 2019-05-12 NOTE — Progress Notes (Signed)
Labs are normal. Pl notify pt.

## 2019-05-30 ENCOUNTER — Other Ambulatory Visit: Payer: Self-pay

## 2019-05-30 ENCOUNTER — Encounter: Payer: Self-pay | Admitting: Family Medicine

## 2019-05-30 ENCOUNTER — Ambulatory Visit: Payer: BLUE CROSS/BLUE SHIELD | Admitting: Family Medicine

## 2019-05-30 VITALS — BP 106/72 | HR 91 | Ht 62.0 in | Wt 159.0 lb

## 2019-05-30 DIAGNOSIS — M47816 Spondylosis without myelopathy or radiculopathy, lumbar region: Secondary | ICD-10-CM

## 2019-05-30 DIAGNOSIS — M999 Biomechanical lesion, unspecified: Secondary | ICD-10-CM | POA: Diagnosis not present

## 2019-05-30 NOTE — Assessment & Plan Note (Signed)
Discussed HEP, hx of autoimmune, discussed which activities to do respond well to OMT.  RTC in 4-6 weeks

## 2019-05-30 NOTE — Patient Instructions (Signed)
Great to see you  DHEA 50 mg daily 4 weeks on 2 weeks off  Try the synthroid 5mcg daily for a week then write me  See me again in 4-6 weeks

## 2019-05-30 NOTE — Assessment & Plan Note (Signed)
Decision today to treat with OMT was based on Physical Exam  After verbal consent patient was treated with HVLA, ME techniques in Cervical, thoracic, rib, lumbar, and sacral  areas  Patient tolerated the procedure well with improvement in symptoms  Patient given exercises, stretches and lifestyle modifications  See medications in patient instructions if given  Patient will follow up in 6-12 weeks

## 2019-05-30 NOTE — Progress Notes (Signed)
[name] [place], [place] [address] Phone: [phone] Subjective:   I Wendy [name] am serving as a Education administrator for Dr. Hulan Saas.    CC: Allover pain including back pain  [id number]  LAKYIA BEHE is a 53 y.o. female coming in with complaint of back pain. States that she is not feeling too good. Feels achy everywhere. Patient has no autoimmune disease Continues with the same medications.  Patient is scheduled to have a whole-body scan to evaluate for synovitis.  Patient continues to have aching and pain and does not seem to have significant breakthrough.  Has responded fairly well to osteopathic manipulation. Attempted Synthroid.  Did notice improvement for the first week and then seemed to level out again.   Hx of psoriatic arthritis   Past Medical History:  Diagnosis Date  . Anal fissure 2004   Tx'd with diltiazem  gel by Dr. Lennie Hummer  . Arthritis   . Bronchitis, acute 2008  . Seasonal allergies   . Thyroid disease   . URI 10/12/2007   Qualifier: Diagnosis of  By: Linna Darner MD, Gwyndolyn Saxon     Past Surgical History:  Procedure Laterality Date  . BREAST SURGERY  12/15/2017   breast reduction    Social History   Socioeconomic History  . Marital status: Married    Spouse name: Not on file  . Number of children: Not on file  . Years of education: Not on file  . Highest education level: Not on file  Occupational History  . Not on file  Social Needs  . Financial resource strain: Not on file  . Food insecurity    Worry: Not on file    Inability: Not on file  . Transportation needs    Medical: Not on file    Non-medical: Not on file  Tobacco Use  . Smoking status: Never Smoker  . Smokeless tobacco: Never Used  Substance and Sexual Activity  . Alcohol use: Yes    Comment: occ  . Drug use: No  . Sexual activity: Not on file  Lifestyle  . Physical activity    Days per week: Not on file    Minutes per session: Not on  file  . Stress: Not on file  Relationships  . Social Herbalist on phone: Not on file    Gets together: Not on file    Attends religious service: Not on file    Active member of club or organization: Not on file    Attends meetings of clubs or organizations: Not on file    Relationship status: Not on file  Other Topics Concern  . Not on file  Social History Narrative  . Not on file   Allergies  Allergen Reactions  . Pollen Extract   . Arava [Leflunomide] Other (See Comments)    Hair loss  . Methotrexate Derivatives Nausea Only   Family History  Problem Relation Age of Onset  . Hypertension Mother   . Diabetes Mother     Current Outpatient Medications (Endocrine & Metabolic):  .  levothyroxine (SYNTHROID, LEVOTHROID) 75 MCG tablet,  .  thyroid (ARMOUR THYROID) 15 MG tablet, Take 1 tablet (15 mg total) by mouth daily.   Current Outpatient Medications (Respiratory):  .  levocetirizine (XYZAL) 5 MG tablet, Take 5 mg by mouth daily.   Current Outpatient Medications (Hematological):  Marland Kitchen  Cyanocobalamin (VITAMIN B 12 PO), Take by mouth.  Current Outpatient Medications (Other):  .  Calcium Carbonate-Vit D-Min (CALCIUM 1200 PO), Take by mouth. .  COSENTYX SENSOREADY, 300 MG, 150 MG/ML SOAJ, INJECT TWO PENS SUBCUTANEOUSLY EVERY 4 WEEKS. REFRIGERATE. ALLOW 15 TO30 MINUTES AT ROOM TEMP PRIOR TO ADMINISTRATION. .  Diclofenac Sodium 2 % SOLN, Place 2 g onto the skin 2 (two) times daily. .  Omega-3 Fatty Acids (FISH OIL) 435 MG CAPS, Take by mouth. Marland Kitchen.  tiZANidine (ZANAFLEX) 4 MG tablet, Take 1 tablet (4 mg total) by mouth at bedtime. .  Vitamin D, Ergocalciferol, (DRISDOL) 1.25 MG (50000 UT) CAPS capsule, Take 1 capsule (50,000 Units total) by mouth every 30 (thirty) days.    Past medical history, social, surgical and family history all reviewed in electronic medical record.  No pertanent information unless stated regarding to the chief complaint.   Review of Systems:   No headache, visual changes, nausea, vomiting, diarrhea, constipation, dizziness, abdominal pain, skin rash, fevers, chills, night sweats, weight loss, swollen lymph nodes,  chest pain, shortness of breath, mood changes.  Positive muscle aches, body aches  Objective  Blood pressure 106/72, pulse 91, height 5\' 2"  (1.575 m), weight 159 lb (72.1 kg), SpO2 98 %.    General: No apparent distress alert and oriented x3 mood and affect normal, dressed appropriately.  HEENT: Pupils equal, extraocular movements intact  Respiratory: Patient's speak in full sentences and does not appear short of breath  Cardiovascular: No lower extremity edema, non tender, no erythema  Skin: Warm dry intact with no signs of infection or rash on extremities or on axial skeleton.  Abdomen: Soft nontender  Neuro: Cranial nerves II through XII are intact, neurovascularly intact in all extremities with 2+ DTRs and 2+ pulses.  Lymph: No lymphadenopathy of posterior or anterior cervical chain or axillae bilaterally.  Gait normal with good balance and coordination.  MSK:  tender with full range of motion and good stability and symmetric strength and tone of shoulders, elbows, wrist, hip, knee and ankles bilaterally.    Back - Normal skin, Spine mild loss of lordosis tightness on the right side of the paraspinal musculature of the lumbar spine no tenderness to vertebral process palpation.  Paraspinous muscles are not tender and without spasm.   Range of motion is full at neck and lumbar sacral regions  Osteopathic findings   T9 extended rotated and side bent left L2 flexed rotated and side bent right Sacrum right on right     Impression and Recommendations:     This case required medical decision making of moderate complexity. The above documentation has been reviewed and is accurate and complete Judi SaaZachary M Issaiah Seabrooks, DO       Note: This dictation was prepared with Dragon dictation along with smaller phrase technology. Any  transcriptional errors that result from this process are unintentional.

## 2019-06-05 ENCOUNTER — Encounter (HOSPITAL_COMMUNITY)
Admission: RE | Admit: 2019-06-05 | Discharge: 2019-06-05 | Disposition: A | Discharge status: Home / Self Care | Payer: BC Managed Care – PPO | Source: Ambulatory Visit | Attending: Rheumatology | Admitting: Rheumatology

## 2019-06-05 ENCOUNTER — Other Ambulatory Visit: Payer: Self-pay | Admitting: Rheumatology

## 2019-06-05 ENCOUNTER — Other Ambulatory Visit: Payer: Self-pay

## 2019-06-05 DIAGNOSIS — M170 Bilateral primary osteoarthritis of knee: Secondary | ICD-10-CM | POA: Diagnosis not present

## 2019-06-05 DIAGNOSIS — M19072 Primary osteoarthritis, left ankle and foot: Secondary | ICD-10-CM | POA: Insufficient documentation

## 2019-06-05 DIAGNOSIS — L4050 Arthropathic psoriasis, unspecified: Secondary | ICD-10-CM | POA: Diagnosis not present

## 2019-06-05 DIAGNOSIS — M791 Myalgia, unspecified site: Secondary | ICD-10-CM

## 2019-06-05 DIAGNOSIS — M19071 Primary osteoarthritis, right ankle and foot: Secondary | ICD-10-CM

## 2019-06-05 DIAGNOSIS — M533 Sacrococcygeal disorders, not elsewhere classified: Secondary | ICD-10-CM | POA: Diagnosis not present

## 2019-06-05 DIAGNOSIS — M2550 Pain in unspecified joint: Secondary | ICD-10-CM | POA: Diagnosis not present

## 2019-06-05 MED ORDER — TECHNETIUM TC 99M MEDRONATE IV KIT
19.8000 | PACK | Freq: Once | INTRAVENOUS | Status: AC | PRN
  Administered 2019-06-05: 19.8 via INTRAVENOUS

## 2019-06-05 NOTE — Telephone Encounter (Signed)
Last Visit: 05/11/19 Next Visit: due November 2020. Message sent to the front to schedule  Labs: 05/09/19 WNL TB Gold: 11/11/18 Neg   Okay to refill per Dr. Estanislado Pandy

## 2019-06-05 NOTE — Telephone Encounter (Signed)
Please schedule patient for a follow up visit. Patient due November 2020. Thanks! 

## 2019-06-07 ENCOUNTER — Encounter: Payer: Self-pay | Admitting: Neurology

## 2019-06-07 ENCOUNTER — Ambulatory Visit (INDEPENDENT_AMBULATORY_CARE_PROVIDER_SITE_OTHER): Payer: BC Managed Care – PPO | Admitting: Neurology

## 2019-06-07 ENCOUNTER — Other Ambulatory Visit: Payer: Self-pay

## 2019-06-07 ENCOUNTER — Encounter: Payer: Self-pay | Admitting: Family Medicine

## 2019-06-07 VITALS — BP 118/76 | HR 68 | Temp 97.8°F | Ht 62.0 in | Wt 160.0 lb

## 2019-06-07 DIAGNOSIS — L4050 Arthropathic psoriasis, unspecified: Secondary | ICD-10-CM

## 2019-06-07 DIAGNOSIS — G4719 Other hypersomnia: Secondary | ICD-10-CM

## 2019-06-07 DIAGNOSIS — R5383 Other fatigue: Secondary | ICD-10-CM

## 2019-06-07 DIAGNOSIS — G8929 Other chronic pain: Secondary | ICD-10-CM

## 2019-06-07 DIAGNOSIS — M545 Low back pain, unspecified: Secondary | ICD-10-CM

## 2019-06-07 DIAGNOSIS — R0683 Snoring: Secondary | ICD-10-CM

## 2019-06-07 NOTE — Progress Notes (Signed)
SLEEP MEDICINE CLINIC    Provider:  Larey Seat, MD  Primary Care Physician:  Crist Infante, MD [place] [address]     Referring Provider: Crist Infante, [place],  [place] [address]          Chief Complaint according to patient   Patient presents with:    . New Patient (Initial Visit)           HISTORY OF PRESENT ILLNESS:  Wendy [name] is a 53 y.o. year old Other or two or more races, of [place] descent ,a  female patient seen here upon referral on 06/07/2019 Chief concern according to patient :  " My children say that I snore ( 5 and 9 years old). I have snored for a while- and I gained weight due to inability to exercise" .   I have the pleasure of seeing Wendy [name] today, a right -handed asian female with a possible sleep disorder.  She has a thyroid disease.  Psoriatic arthritis- painful joints.  She reports Mab treatment have cleared the skin lesions but she noted myalgia.     Sleep relevant medical history:  Nocturia every night 1-2 , Night terrors - may be - confused arousals after night mares. ,no history of Tonsillectomy, cervical spine surgery deviated septum repair or UPPP. She has a DDD and was tols she may need cervical spine surgery.    Family medical /sleep history: sister has DDD, psoriasis in maternal cousins.   Mother likely has OSA, patient witnessed apnoeic spells, hypersomnia.    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 Family status is married with 2 children,  grandchildren.  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 One dog is the family pet.   Tobacco use: none -ETOH use; socially ,  Caffeine intake in form of Coffee( none ) Soda( 3 week ) Tea ( hot chai style ) and no energy drinks. Regular exercise in form of  Walking, looking forward to the pool opening. Hobbies :  baking, cooking.     Sleep habits are as follows: The patient's dinner time is between 6-9  PM. The patient goes to bed at 10-11.30 PM and turns the TV on or reads- she falls asleep right away. She continues to sleep for 4 hours, wakes for her bathroom break.   The preferred sleep position is on her sides, with the support of 1 contoured pillow.  She wakes up with tingling numbness in the hands. Dreams are reportedly frequent/vivid/ she can often recall them. The patient wakes up spontaneously at 7.00 AM-. 7 AM is the usual rise time. She reports not feeling refreshed or restored in AM, with symptoms such as fatigue, even after 8 hours of sleep, with a very dry mouth, morning headaches( dull, congested ) which lift after 1-2 hours. Most remarkable is her high residual fatigue.  Naps are taken infrequently,  Mostly unscheduled and precipitated by a period of resting and reading. Naps lasting from 15 to 30 minutes and are more refreshing than nocturnal sleep.  She feels recharged for another 2 hours.    Review of Systems: Out of a complete 14 system review, the patient complains of only the following symptoms, and all other reviewed systems are negative.:  Fatigue, sleepiness , snoring,    How  likely are you to doze in the following situations: 0 = not likely, 1 = slight chance, 2 = moderate chance, 3 = high chance   Sitting and Reading? Watching Television? Sitting inactive in a public place (theater or meeting)? As a passenger in a car for an hour without a break? Lying down in the afternoon when circumstances permit? Sitting and talking to someone? Sitting quietly after lunch without alcohol? In a car, while stopped for a few minutes in traffic?   Total =  21/ 24 points . Has fallen asleep in the car line at kids school---  FSS endorsed at [place] 63 points.   Social History   Socioeconomic History  . Marital status: Married    Spouse name: Not on file  . Number of children: Not on file   . Years of education: Not on file  . Highest education level: Not on file  Occupational History  . Not on file  Social Needs  . Financial resource strain: Not on file  . Food insecurity    Worry: Not on file    Inability: Not on file  . Transportation needs    Medical: Not on file    Non-medical: Not on file  Tobacco Use  . Smoking status: Never Smoker  . Smokeless tobacco: Never Used  Substance and Sexual Activity  . Alcohol use: Yes    Comment: occ  . Drug use: No  . Sexual activity: Not on file  Lifestyle  . Physical activity    Days per week: Not on file    Minutes per session: Not on file  . Stress: Not on file  Relationships  . Social Herbalist on phone: Not on file    Gets together: Not on file    Attends religious service: Not on file    Active member of club or organization: Not on file    Attends meetings of clubs or organizations: Not on file    Relationship status: Not on file  Other Topics Concern  . Not on file  Social History Narrative  . Not on file    Family History  Problem Relation Age of Onset  . Hypertension Mother   . Diabetes Mother     Past Medical History:  Diagnosis Date  . Anal fissure 2004   Tx'd with diltiazem  gel by Dr. Lennie Hummer  . Arthritis   . Bronchitis, acute 2008  . Seasonal allergies   . Thyroid disease   . URI 10/12/2007   Qualifier: Diagnosis of  By: Linna Darner MD, Gwyndolyn Saxon      Past Surgical History:  Procedure Laterality Date  . BREAST SURGERY  12/15/2017   breast reduction      Current Outpatient Medications on File Prior to Visit  Medication Sig Dispense Refill  . Calcium Carbonate-Vit D-Min (CALCIUM 1200 PO) Take by mouth.    . COSENTYX SENSOREADY, 300 MG, 150 MG/ML SOAJ INJECT TWO PENS UNDER THE SKIN EVERY 4 WEEKS. REFRIGERATE. ALLOW 15 TO30 MINUTES AT ROOM TEMP PRIOR TO ADMINISTRATION. 6 pen 0  . Cyanocobalamin (VITAMIN B 12 PO) Take by mouth.    . Diclofenac Sodium 2 % SOLN Place 2 g onto the  skin 2 (two) times daily. (Patient taking differently: Place 2 g onto the skin 2 (two) times daily as needed. ) 112 g 3  . levocetirizine (XYZAL) 5 MG tablet Take 5 mg by mouth daily.  5  . levothyroxine (SYNTHROID, LEVOTHROID) 75 MCG tablet     .  Omega-3 Fatty Acids (FISH OIL) 435 MG CAPS Take by mouth.    . thyroid (ARMOUR THYROID) 15 MG tablet Take 1 tablet (15 mg total) by mouth daily. (Patient taking differently: Take 30 mg by mouth daily. ) 30 tablet 1  . tiZANidine (ZANAFLEX) 4 MG tablet Take 1 tablet (4 mg total) by mouth at bedtime. 30 tablet 2  . Vitamin D, Ergocalciferol, (DRISDOL) 1.25 MG (50000 UT) CAPS capsule Take 1 capsule (50,000 Units total) by mouth every 30 (thirty) days. 3 capsule 1   No current facility-administered medications on file prior to visit.     Allergies  Allergen Reactions  . Pollen Extract   . Arava [Leflunomide] Other (See Comments)    Hair loss  . Methotrexate Derivatives Nausea Only    Physical exam:  Today's Vitals   06/07/19 1058  BP: 118/76  Pulse: 68  Temp: 97.8 F (36.6 C)  Weight: 160 lb (72.6 kg)  Height: 5' 2"  (1.575 m)   Body mass index is 29.26 kg/m.   Wt Readings from Last 3 Encounters:  06/07/19 160 lb (72.6 kg)  05/30/19 159 lb (72.1 kg)  05/11/19 159 lb (72.1 kg)     Ht Readings from Last 3 Encounters:  06/07/19 5' 2"  (1.575 m)  05/30/19 5' 2"  (1.575 m)  05/11/19 5' 2"  (1.575 m)      General: The patient is awake, alert and appears not in acute distress. The patient is well groomed. Head: Normocephalic, atraumatic.  Neck is supple. Mallampati 3,  neck circumference: 13 inches .  Nasal airflow is patent.  Retrognathia is not  seen.  Dental status:  Cardiovascular:  Regular rate and cardiac rhythm by pulse,  without distended neck veins. Respiratory: Lungs are clear to auscultation.  Skin:  Without evidence of ankle edema, or rash. Trunk: The patient's posture is erect.   Neurologic exam : The patient is awake  and alert, oriented to place and time.   Memory subjective described as intact.  Attention span & concentration ability appears normal.  Speech is fluent,  without  dysarthria, dysphonia or aphasia.  Mood and affect are appropriate.   Cranial nerves: no loss of smell or taste reported  Pupils are equal and briskly reactive to light. Funduscopic exam deferred.  She reports floaters.   Extraocular movements in vertical and horizontal planes were intact and without nystagmus. No Diplopia. Visual fields by finger perimetry are intact. Hearing was intact to soft voice and finger rubbing.    Facial sensation intact to fine touch.  Facial motor strength is symmetric and tongue and uvula move midline.  Neck ROM : rotation, tilt and flexion extension were normal for age and shoulder shrug was symmetrical.    Motor exam:  Symmetric bulk, tone and ROM.  Grip strength, normal thenar eminance.  Normal tone without cog wheeling, symmetric ROM .   Sensory:  Fine touch, pinprick and vibration were tested  and  normal.  Proprioception tested in the upper extremities was normal.   Coordination: Rapid alternating movements in the fingers/hands were of normal speed.  The Finger-to-nose maneuver was intact without evidence of ataxia, dysmetria or tremor.   Gait and station: Patient could rise unassisted from a seated position, walked without assistive device.  Stance is of normal width/ base and the patient turned with 3 steps.  Toe and heel walk were deferred.  Deep tendon reflexes: in the  upper and lower extremities are symmetric and intact.  Babinski response was deferred.  After spending a total time of  35 minutes face to face and additional time for physical and neurologic examination, review of laboratory studies,  personal review of imaging studies, reports and results of other testing and review of referral information / records as far as provided in visit, I have established the following  assessments:  1) lifelong hypersomnia. The patient as definitely hypersomnia - a high degree of sleepiness and fatigue, and no sleep paralysis, cataplexy was reported.   2) the joint pain, myalgia and thyroid disease all contribute to fatigue, any autoimmune disease can contribute to fatigue.   3) snoring and hypersomnia should justify a sleep study. I feel confident she can get a screening test.    My Plan is to proceed with:  1) HST   2) HLA  narcolepsy panel   3) consider modafinil for fatigue if OSA is not present.   I would like to thank Bo Merino, MD  and Crist Infante, [place],  [place] [address] for allowing me to meet with and to take care of this pleasant patient.   In short, Wendy [name] is presenting with EDS ( excessive daytime slepiness) , I plan to follow up either personally or through our NP within 2-3  month.   CC: I will share my notes with Bo Merino , MD   Electronically signed by: Larey Seat, MD 06/07/2019 11:28 AM  [facility] Sleep Board certified by The [employer] of Sleep Medicine and Diplomate of the [employer] of Sleep Medicine. Board certified In Neurology through the [place], Fellow of the [employer] of Neurology. Medical Director of [employer].

## 2019-06-07 NOTE — Patient Instructions (Signed)
Screening for Sleep Apnea  Sleep apnea is a condition in which breathing pauses or becomes shallow during sleep. Sleep apnea screening is a test to determine if you are at risk for sleep apnea. The test is easy and only takes a few minutes. Your health care provider may ask you to have this test in preparation for surgery or as part of a physical exam. What are the symptoms of sleep apnea? Common symptoms of sleep apnea include:  Snoring.  Restless sleep.  Daytime sleepiness.  Pauses in breathing.  Choking during sleep.  Irritability.  Forgetfulness.  Trouble thinking clearly.  Depression.  Personality changes. Most people with sleep apnea are not aware that they have it. Why should I get screened? Getting screened for sleep apnea can help:  Ensure your safety. It is important for your health care providers to know whether or not you have sleep apnea, especially if you are having surgery or have other long-term (chronic) health conditions.  Improve your health and allow you to get a better night's rest. Restful sleep can help you: ? Have more energy. ? Lose weight. ? Improve high blood pressure. ? Improve diabetes management. ? Prevent stroke. ? Prevent car accidents. How is screening done? Screening usually includes being asked a list of questions about your sleep quality. Some questions you may be asked include:  Do you snore?  Is your sleep restless?  Do you have daytime sleepiness?  Has a partner or spouse told you that you stop breathing during sleep?  Have you had trouble concentrating or memory loss? If your screening test is positive, you are at risk for the condition. Further testing may be needed to confirm a diagnosis of sleep apnea. Where to find more information You can find screening tools online or at your health care clinic. For more information about sleep apnea screening and healthy sleep, visit these websites:  Centers for Disease Control and  Prevention: Detail[employer].iswww.cdc.gov/sleep/index.html  American Sleep Apnea Association: www.sleepapnea.org Contact a health care provider if:  You think that you may have sleep apnea. Summary  Sleep apnea screening can help determine if you are at risk for sleep apnea.  It is important for your health care providers to know whether or not you have sleep apnea, especially if you are having surgery or have other chronic health conditions.  You may be asked to take a screening test for sleep apnea in preparation for surgery or as part of a physical exam. This information is not intended to replace advice given to you by your health care provider. Make sure you discuss any questions you have with your health care provider. Document Released: 02/26/2017 Document Revised: 09/02/2018 Document Reviewed: 02/26/2017 Elsevier Patient Education  2020 [employer] Hypersomnia Hypersomnia is a condition in which a person feels very tired during the day even though he or she gets plenty of sleep at night. A person with this condition may take naps during the day and may find it very difficult to wake up from sleep. Hypersomnia may affect a person's ability to think, concentrate, drive, or remember things. What are the causes? The cause of this condition may not be known. Possible causes include:  Certain medicines.  Sleep disorders, such as narcolepsy and sleep apnea.  Injury to the head, brain, or spinal cord.  Drug or alcohol use.  Gastroesophageal reflux disease (GERD).  Tumors.  Certain medical conditions, such as depression, diabetes, or an underactive thyroid gland (hypothyroidism). What are the signs or symptoms?  The main symptoms of hypersomnia include:  Feeling very tired throughout the day, regardless of how much sleep you got the night before.  Having trouble waking up. Others may find it difficult to wake you up when you are sleeping.  Sleeping for longer and longer periods at a time.   Taking naps throughout the day. Other symptoms may include:  Feeling restless, anxious, or annoyed.  Lacking energy.  Having trouble with: ? Remembering. ? Speaking. ? Thinking.  Loss of appetite.  Seeing, hearing, tasting, smelling, or feeling things that are not real (hallucinations). How is this diagnosed? This condition may be diagnosed based on:  Your symptoms and medical history.  Your sleeping habits. Your health care provider may ask you to write down your sleeping habits in a daily sleep log, along with any symptoms you have.  A series of tests that are done while you sleep (sleep study or polysomnogram).  A test that measures how quickly you can fall asleep during the day (daytime nap study or multiple sleep latency test). How is this treated? Treatment can help you manage your condition. Treatment may include:  Following a regular sleep routine.  Lifestyle changes, such as changing your eating habits, getting regular exercise, and avoiding alcohol or caffeinated beverages.  Taking medicines to make you more alert (stimulants) during the day.  Treating any underlying medical causes of hypersomnia. Follow these instructions at home: Sleep routine   Schedule the same bedtime and wake-up time each day.  Practice a relaxing bedtime routine. This may include reading, meditation, deep breathing, or taking a warm bath before going to sleep.  Get regular exercise each day. Avoid strenuous exercise in the evening hours.  Keep your sleep environment at a cooler temperature, darkened, and quiet.  Sleep with pillows and a mattress that are comfortable and supportive.  Schedule short 20-minute naps for when you feel sleepiest during the day.  Talk with your employer or teachers about your hypersomnia. If possible, adjust your schedule so that: ? You have a regular daytime work schedule. ? You can take a scheduled nap during the day. ? You do not have to work or be  active at night.  Do not eat a heavy meal for a few hours before bedtime. Eat your meals at about the same times every day.  Avoid drinking alcohol or caffeinated beverages. Safety   Do not drive or use heavy machinery if you are sleepy. Ask your health care provider if it is safe for you to drive.  Wear a life jacket when swimming or spending time near water. General instructions  Take supplements and over-the-counter and prescription medicines only as told by your health care provider.  Keep a sleep log that will help your doctor manage your condition. This may include information about: ? What time you go to bed each night. ? How often you wake up at night. ? How many hours you sleep at night. ? How often and for how long you nap during the day. ? Any observations from others, such as leg movements during sleep, sleep walking, or snoring.  Keep all follow-up visits as told by your health care provider. This is important. Contact a health care provider if:  You have new symptoms.  Your symptoms get worse. Get help right away if:  You have serious thoughts about hurting yourself or someone else. If you ever feel like you may hurt yourself or others, or have thoughts about taking your own life, get  help right away. You can go to your nearest emergency department or call:  Your local emergency services (911 in the [place]  A suicide crisis helpline, such as the National Suicide Prevention Lifeline at 514-474-44091-854-784-8729. This is open 24 hours a day. Summary  Hypersomnia refers to a condition in which you feel very tired during the day even though you get plenty of sleep at night.  A person with this condition may take naps during the day and may find it very difficult to wake up from sleep.  Hypersomnia may affect a person's ability to think, concentrate, drive, or remember things.  Treatment, such as following a regular sleep routine and making some lifestyle changes, can help  you manage your condition. This information is not intended to replace advice given to you by your health care provider. Make sure you discuss any questions you have with your health care provider. Document Released: 11/06/2002 Document Revised: 11/18/2017 Document Reviewed: 11/18/2017 Elsevier Patient Education  2020 [employer]

## 2019-06-08 ENCOUNTER — Other Ambulatory Visit: Payer: Self-pay

## 2019-06-11 MED ORDER — THYROID 30 MG PO TABS: 30.0000 mg | ORAL_TABLET | Freq: Every day | ORAL | 3 refills | Status: DC

## 2019-06-14 LAB — NARCOLEPSY EVALUATION
DQA1*01:02: NEGATIVE
DQB1*06:02: NEGATIVE

## 2019-06-14 NOTE — Progress Notes (Signed)
Please notify patient that total body bone scan showed increased uptake in the knees, ankles, feet elbow and wrist which appears to be consistent with osteoarthritis.  If she has any joint swelling then she should contact us.

## 2019-06-15 ENCOUNTER — Encounter: Payer: Self-pay | Admitting: Neurology

## 2019-06-15 NOTE — Progress Notes (Signed)
Office Visit Note  Patient: Wendy BurkeGita N [name]             Date of Birth: 07/10/1966           MRN: [id number]             PCP: Rodrigo RanPerini, Mark, MD Referring: Rodrigo RanPerini, Mark, MD Visit Date: 06/20/2019 Occupation: @GUAROCC @  Subjective:  Pain and stiffness in multiple joints.     History of Present Illness: Wendy [name] is a 53 y.o. female with history of psoriatic arthritis and psoriasis.  She states she continues to have pain and stiffness in multiple joints.  She feels she has intermittent swelling in her joints.  She gives history of swelling in her hands and her knee joints.  She is also having a flare of Achilles tendinitis bilaterally.  She states her knees are very stiff and she has difficulty walking.  She has been walking 5 miles every day and then feels very stiff at the end of the day.  She is very stiff in the morning as well.  She continues to have some SI joint pain.  She denies any recent psoriasis rash.  She states she has chronic insomnia and she is scheduled to have sleep study next week.  Activities of Daily Living:  Patient reports morning stiffness for 45 minutes.   Patient Reports nocturnal pain.  Difficulty dressing/grooming: Reports Difficulty climbing stairs: Reports Difficulty getting out of chair: Denies Difficulty using hands for taps, buttons, cutlery, and/or writing: Reports  Review of Systems  Constitutional: Positive for fatigue. Negative for night sweats, weight gain and weight loss.  HENT: Positive for mouth dryness. Negative for mouth sores, trouble swallowing, trouble swallowing and nose dryness.   Eyes: Negative for pain, redness, itching, visual disturbance and dryness.  Respiratory: Negative for cough, shortness of breath, wheezing and difficulty breathing.   Cardiovascular: Negative for chest pain, palpitations, hypertension, irregular heartbeat and swelling in legs/feet.  Gastrointestinal: Negative for abdominal pain, blood in stool, constipation and  diarrhea.  Endocrine: Negative for increased urination.  Genitourinary: Negative for painful urination, pelvic pain and vaginal dryness.  Musculoskeletal: Positive for arthralgias, joint pain, joint swelling and morning stiffness. Negative for myalgias, muscle weakness, muscle tenderness and myalgias.  Skin: Negative for color change, rash, hair loss, redness, skin tightness, ulcers and sensitivity to sunlight.  Allergic/Immunologic: Negative for susceptible to infections.  Neurological: Positive for numbness and weakness. Negative for dizziness, light-headedness, headaches, memory loss and night sweats.  Hematological: Positive for bruising/bleeding tendency. Negative for swollen glands.  Psychiatric/Behavioral: Negative for depressed mood, confusion and sleep disturbance. The patient is not nervous/anxious.     PMFS History:  Patient Active Problem List   Diagnosis Date Noted  . Nonallopathic lesion of lumbosacral region 11/04/2018  . Nonallopathic lesion of sacral region 11/04/2018  . Nonallopathic lesion of thoracic region 11/04/2018  . Psoriasis 07/05/2017  . DJD (degenerative joint disease), cervical 02/03/2017  . Spondylosis of lumbar region without myelopathy or radiculopathy 02/03/2017  . High risk medication use 10/06/2016  . Plantar fasciitis 10/06/2016  . Achilles tendinitis of both lower extremities 10/06/2016  . Vitamin D deficiency 10/06/2016  . Primary osteoarthritis of both knees 10/06/2016  . Primary osteoarthritis of both feet 10/06/2016  . Chronic low back pain without sciatica 10/06/2016  . Hypothyroidism 10/06/2016  . Polyp of colon 10/06/2016  . B12 deficiency 10/06/2016  . 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 Anal fissure 04/11/2012    Past Medical History:  Diagnosis  Date  . Anal fissure 2004   Tx'd with diltiazem  gel by Dr. Kendrick Ranchim Davis  . Arthritis   . Bronchitis, acute 2008  . Seasonal allergies   . Thyroid disease   . URI 10/12/2007    Qualifier: Diagnosis of  By: Alwyn RenHopper MD, Barbee ShropshireWilliam      Family History  Problem Relation Age of Onset  . Hypertension Mother   . Diabetes Mother    Past Surgical History:  Procedure Laterality Date  . BREAST SURGERY  12/15/2017   breast reduction    Social History   Social History Narrative  . Not on file   Immunization History  Administered Date(s) Administered  . Pneumococcal Polysaccharide-23 10/28/2010  . Zoster Recombinat (Shingrix) 11/16/2018, 01/30/2019     Objective: Vital Signs: BP 122/83 (BP Location: Left Arm, Patient Position: Sitting, Cuff Size: Normal)   Pulse 63   Resp 13   Ht 5\' 2"  (1.575 m)   Wt 158 lb 12.8 oz (72 kg)   BMI 29.04 kg/m    Physical Exam Vitals signs and nursing note reviewed.  Constitutional:      Appearance: She is well-developed.  HENT:     Head: Normocephalic and atraumatic.  Eyes:     Conjunctiva/sclera: Conjunctivae normal.  Neck:     Musculoskeletal: Normal range of motion.  Cardiovascular:     Rate and Rhythm: Normal rate and regular rhythm.     Heart sounds: Normal heart sounds.  Pulmonary:     Effort: Pulmonary effort is normal.     Breath sounds: Normal breath sounds.  Abdominal:     General: Bowel sounds are normal.     Palpations: Abdomen is soft.  Lymphadenopathy:     Cervical: No cervical adenopathy.  Skin:    General: Skin is warm and dry.     Capillary Refill: Capillary refill takes less than 2 seconds.  Neurological:     Mental Status: She is alert and oriented to person, place, and time.  Psychiatric:        Behavior: Behavior normal.      Musculoskeletal Exam: C-spine thoracic and lumbar spine with good range of motion.  She has tenderness on palpation of bilateral SI joints.  She has discomfort range of motion of her shoulder joints, elbow joints, wrist joints.  She had tenderness on palpation of her bilateral PIP joints as described below.  No synovitis was noted.  She has discomfort range of motion of  bilateral knee joint.  Mild warmth was noted on palpation of her knee joints.  She had tenderness over her Achilles tendon but no tenosynovitis was noted. CDAI Exam: CDAI Score: 7.3  Patient Global: 8 mm; Provider Global: 5 mm Swollen: 0 ; Tender: 8  Joint Exam      Right  Left  Glenohumeral   Tender     PIP 3   Tender   Tender  PIP 4      Tender  Sacroiliac   Tender   Tender  Knee   Tender   Tender     Investigation: No additional findings.  Imaging: Nm Bone Scan Whole Body  Result Date: 06/05/2019 CLINICAL DATA:  Joint pain. EXAM: NUCLEAR MEDICINE WHOLE BODY BONE SCAN TECHNIQUE: Whole body anterior and posterior images were obtained approximately 3 hours after intravenous injection of radiopharmaceutical. RADIOPHARMACEUTICALS:  19.8 mCi Technetium-4915m MDP IV COMPARISON:  None. FINDINGS: Increased radiotracer uptake within the bilateral knees, ankles, mid tarsal joints, elbows, and wrists may represent arthritic changes.  Otherwise, physiologic distribution of radiotracer. IMPRESSION: Increased radiotracer uptake within the bilateral knees, ankles, mid tarsal joints, elbows, and wrists likely represents arthritic changes. Electronically Signed   By: Fidela Salisbury M.D.   On: 06/05/2019 20:53    Recent Labs: Lab Results  Component Value Date   WBC 7.8 05/09/2019   HGB 12.9 05/09/2019   PLT 196 05/09/2019   NA 141 05/09/2019   K 4.0 05/09/2019   CL 106 05/09/2019   CO2 25 05/09/2019   GLUCOSE 92 05/09/2019   BUN 13 05/09/2019   CREATININE 0.89 05/09/2019   BILITOT 0.3 05/09/2019   ALKPHOS 77 05/27/2017   AST 22 05/09/2019   ALT 28 05/09/2019   PROT 7.0 05/09/2019   ALBUMIN 3.9 05/27/2017   CALCIUM 9.4 05/09/2019   GFRAA 86 05/09/2019   QFTBGOLDPLUS NEGATIVE 11/11/2018    Speciality Comments: Prior therapy:  failed Humira, Enbrel, Otezla, intolerance to oral methotrexate and Arava  Procedures:  No procedures performed Allergies: Pollen extract, Arava  [leflunomide], and Methotrexate derivatives   Assessment / Plan:     Visit Diagnoses: Psoriatic arthritis ([place]) -she continues to have a lot of pain and discomfort in her joints.  Her recent total body bone scan showed increased uptake which was more consistent with osteoarthritis.  She gives history of intermittent swelling and ongoing discomfort and significant stiffness.  She had mild warmth on palpation of her knee joints today.  I believe her disease is still active.  Have advised her to space Cosentyx to every 2 weeks.  We also discussed adding methotrexate to her regimen.  She recalls taking methotrexate multiple years ago at that time she had nausea with the medication.  We discussed using subcu methotrexate.  She was in agreement with that.  My plan is to start her on 50 mg subcu weekly and then increase it to 20 mg subcu weekly after 2 weeks.  She will get labs 2 weeks x 2 and then every 3 months.  We will give her a prescription for folic acid 2 mg p.o. daily.  If she experiences any nausea then Zofran can be added on the day of the injection.  I would like to see the combination therapy for the next 3 months.  Side effects of methotrexate were discussed at length.  Psoriasis -her psoriasis is very well controlled on Cosentyx.  High risk medication use - Cosentyx 300 mg every 28 days.  Last TB gold negative on 11/11/2018 and will monitor yearly.  Most recent CBC/CMP within normal limits on 05/09/2019.  Due for CBC/CMP in September and will monitor every 3 months.  Standing orders are in place.  She has received Pneumovax 23 and Shingrix vaccines.  Recommend annual flu vaccine and Prevnar 13 as indicated for immunosuppressant therapy.  Myalgia -she continues to have some generalized myalgias.  Primary osteoarthritis of both knees -she has osteoarthritis in her knee joints.  She also has some warmth in her knee joints today.  Primary osteoarthritis of both feet -she has chronic discomfort in her  feet.  Other fatigue -probably due to chronic insomnia.  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  Spondylosis of lumbar region without myelopathy or radiculopathy -lower back pain persist.  Stretching exercises were discussed.  DDD (degenerative disc disease), cervical -she had good range of motion of her cervical spine today.  History of hypothyroidism   History of vitamin D deficiency -she is on supplement.  Orders: Orders Placed This Encounter  Procedures  .  DG Chest 2 View  . HIV Antibody (routine testing w rflx)  . IgG, IgA, IgM   Meds ordered this encounter  Medications  . folic acid (FOLVITE) 1 MG tablet    Sig: Take 2 tablets (2 mg total) by mouth daily.    Dispense:  180 tablet    Refill:  3     Follow-Up Instructions: Return in about 3 months (around 09/20/2019) for Psoriatic arthritis.   Pollyann SavoyShaili Lakeya Mulka, MD  Note - This record has been created using Animal nutritionistDragon software.  Chart creation errors have been sought, but may not always  have been located. Such creation errors do not reflect on  the standard of medical care.

## 2019-06-20 ENCOUNTER — Encounter: Payer: Self-pay | Admitting: Rheumatology

## 2019-06-20 ENCOUNTER — Ambulatory Visit (INDEPENDENT_AMBULATORY_CARE_PROVIDER_SITE_OTHER): Payer: BC Managed Care – PPO | Admitting: Rheumatology

## 2019-06-20 ENCOUNTER — Other Ambulatory Visit: Payer: Self-pay

## 2019-06-20 ENCOUNTER — Telehealth: Payer: Self-pay | Admitting: Pharmacist

## 2019-06-20 VITALS — BP 122/83 | HR 63 | Resp 13 | Ht 62.0 in | Wt 158.8 lb

## 2019-06-20 DIAGNOSIS — Z79899 Other long term (current) drug therapy: Secondary | ICD-10-CM | POA: Diagnosis not present

## 2019-06-20 DIAGNOSIS — L409 Psoriasis, unspecified: Secondary | ICD-10-CM

## 2019-06-20 DIAGNOSIS — L4050 Arthropathic psoriasis, unspecified: Secondary | ICD-10-CM

## 2019-06-20 DIAGNOSIS — M19071 Primary osteoarthritis, right ankle and foot: Secondary | ICD-10-CM

## 2019-06-20 DIAGNOSIS — Z8639 Personal history of other endocrine, nutritional and metabolic disease: Secondary | ICD-10-CM

## 2019-06-20 DIAGNOSIS — Z9225 Personal history of immunosupression therapy: Secondary | ICD-10-CM

## 2019-06-20 DIAGNOSIS — M5030 Other cervical disc degeneration, unspecified cervical region: Secondary | ICD-10-CM

## 2019-06-20 DIAGNOSIS — M791 Myalgia, unspecified site: Secondary | ICD-10-CM

## 2019-06-20 DIAGNOSIS — M170 Bilateral primary osteoarthritis of knee: Secondary | ICD-10-CM

## 2019-06-20 DIAGNOSIS — M19072 Primary osteoarthritis, left ankle and foot: Secondary | ICD-10-CM

## 2019-06-20 DIAGNOSIS — R5383 Other fatigue: Secondary | ICD-10-CM

## 2019-06-20 DIAGNOSIS — M533 Sacrococcygeal disorders, not elsewhere classified: Secondary | ICD-10-CM

## 2019-06-20 DIAGNOSIS — M47816 Spondylosis without myelopathy or radiculopathy, lumbar region: Secondary | ICD-10-CM

## 2019-06-20 MED ORDER — FOLIC ACID 1 MG PO TABS: 2.0000 mg | ORAL_TABLET | Freq: Every day | ORAL | 3 refills | Status: DC

## 2019-06-20 NOTE — Telephone Encounter (Signed)
Received a fax from [facility] regarding a prior authorization for Wendy [name]. Authorization has been APPROVED from 06/20/19 to 06/18/22.   Will send document to scan center.  Authorization # [id number]  Still too soon to run a test claim to see what patient's copay will be. Will try to run later this afternoon.

## 2019-06-20 NOTE — Telephone Encounter (Signed)
Received a Prior Authorization request from [employer] for [place]. Due to low stock of Otrexup. Authorization has been submitted to patient's insurance via Cover My Meds. Will update once we receive a response.  3:36 PM Beatriz Chancellor, CPhT

## 2019-06-20 NOTE — Patient Instructions (Addendum)
Standing Labs We placed an order today for your standing lab work.    Please come back and get your standing labs in 2 weeks (8/3), 4 weeks (8/17), 8 weeks (9/14).  We have open lab daily Monday through Thursday from 8:30-12:30 PM and 1:30-4:30 PM and Friday from 8:30-12:30 PM and 1:30 -4:00 PM at the office of Dr. Bo Merino.   You may experience shorter wait times on Monday and Friday afternoons. The office is located at [address], [place], [place],  [address] No appointment is necessary.   Labs are drawn by [facility].  You may receive a bill from [facility] for your lab work.  If you wish to have your labs drawn at another location, please call the office 24 hours in advance to send orders.  If you have any questions regarding directions or hours of operation,  please call [phone].   Just as a reminder please drink plenty of water prior to coming for your lab work. Thanks!  Vaccines You are taking a medication(s) that can suppress your immune system.  The following immunizations are recommended: . Flu annually . Pneumonia (Pneumovax 23 and Prevnar 13 spaced at least 1 year apart) . Shingrix  Please check with your PCP to make sure you are up to date.

## 2019-06-20 NOTE — Telephone Encounter (Signed)
Dose of methotrexate will be Otrexup 15 mg every 7 days for 2 weeks then increase to Otrexup 20 mg every 7 days as tolerated along with folic acid 2 mg daily. Prescription pending insurance approval.  Patient given copay card and samples of Otrexup 15 mg in office.  She understands insurance may not approve and will schedule appointment for vial and syringe training if needed.  Prior therapy includes: failed Humira, Enbrel, Otezla, intolerance to oral methotrexate and Arava.

## 2019-06-20 NOTE — Progress Notes (Signed)
Pharmacy Note  Subjective: Patient presents today to the [facility] to see Dr. Corliss Skainseveshwar.  Patient seen by the pharmacist for counseling on methotrexate for psoriatic arthritis. Prior therapy includes: failed Humira, Enbrel, Otezla, intolerance to oral methotrexate and Arava.  Objective:  CBC    Component Value Date/Time   WBC 7.8 05/09/2019 1510   RBC 4.54 05/09/2019 1510   HGB 12.9 05/09/2019 1510   HCT 39.0 05/09/2019 1510   PLT 196 05/09/2019 1510   MCV 85.9 05/09/2019 1510   MCH 28.4 05/09/2019 1510   MCHC 33.1 05/09/2019 1510   RDW 13.5 05/09/2019 1510   LYMPHSABS 2,535 05/09/2019 1510   MONOABS 360 05/27/2017 1516   EOSABS 406 05/09/2019 1510   BASOSABS 78 05/09/2019 1510    CMP     Component Value Date/Time   NA 141 05/09/2019 1510   NA 139 07/28/2016   K 4.0 05/09/2019 1510   CL 106 05/09/2019 1510   CO2 25 05/09/2019 1510   GLUCOSE 92 05/09/2019 1510   BUN 13 05/09/2019 1510   BUN 16 07/28/2016   CREATININE 0.89 05/09/2019 1510   CALCIUM 9.4 05/09/2019 1510   PROT 7.0 05/09/2019 1510   ALBUMIN 3.9 05/27/2017 1516   AST 22 05/09/2019 1510   ALT 28 05/09/2019 1510   ALKPHOS 77 05/27/2017 1516   BILITOT 0.3 05/09/2019 1510   GFRNONAA 74 05/09/2019 1510   GFRAA 86 05/09/2019 1510    Baseline Immunosuppressant Therapy Labs TB GOLD Quantiferon TB Gold Latest Ref Rng & Units 11/11/2018  Quantiferon TB Gold Plus NEGATIVE NEGATIVE   Hepatitis Panel : negative 10/16/2010  HIV: pending next lab work  Immunoglobulins:pending next lab work  SPEP: normal 04/25/2016  G6PD No results found for: G6PDH TPMT No results found for: TPMT   Chest-xray:  Pending next office visit  Contraception: post menopausal  Alcohol use: N/A  Assessment/Plan:   Patient was counseled on the purpose, proper use, and adverse effects of methotrexate including nausea, infection, and signs and symptoms of pneumonitis. Discussed that there is the possibility of an  increased risk of malignancy, specifically lymphomas, but it is not well understood if this increased risk is due to the medication or the disease state.  Instructed patient that medication should be held for infection and prior to surgery.  Advised patient to avoid live vaccines. Recommend annual influenza, Pneumovax 23, Prevnar 13, and Shingrix as indicated.   Reviewed instructions with patient to take methotrexate weekly along with folic acid daily.  Discussed the importance of frequent monitoring of kidney and liver function and blood counts, and provided patient with standing lab instructions.  Counseled patient to avoid NSAIDs and alcohol while on methotrexate.  Provided patient with educational materials on methotrexate and answered all questions.   Patient voiced understanding.  Patient consented to methotrexate use.  Will upload into chart.  Will apply for MTX pen device through insurance.  Dose of methotrexate will be Otrexup 15 mg every 7 days for 2 weeks then increase to Otrexup 20 mg every 7 days as tolerated along with folic acid 2 mg daily. Prescription pending insurance approval.  Patient given copay card and samples of Otrexup 15 mg in office.  She understands insurance may not approve and will schedule appointment for vial and syringe training if needed.  Medication Samples have been provided to the patient.  Drug name: Otrexup     Strength: 15 mg    Qty: 2   LOT: [id number]   Exp.Date:  12/2019  Dosing instructions: Inject 1 pen every 7 days  She is to change Cosentyx dosing to 150 mg every 14 days.  Patient verbalized understanding.  All questions encouraged and answered.  Instructed patient to call with any further questions or concerns.  Mariella Saa, PharmD, [place], [place] Clinical Specialty Pharmacist [phone]  06/20/2019 11:15 AM

## 2019-06-20 NOTE — Telephone Encounter (Signed)
Ran test claim 4 pens for 28 days. Patient has a Zero $ copay for both the 15mg  and 20mg  auto-injectors. Patient is copay card eligible if her copay ever changes.

## 2019-06-20 NOTE — Telephone Encounter (Signed)
Left voicemail to update about medication approval.

## 2019-06-22 MED ORDER — OTREXUP 20 MG/0.4ML ~~LOC~~ SOAJ: 20.0000 mg | SUBCUTANEOUS | 0 refills | Status: DC

## 2019-06-22 MED ORDER — FOLIC ACID 1 MG PO TABS: 2.0000 mg | ORAL_TABLET | Freq: Every day | ORAL | 3 refills | Status: DC

## 2019-06-22 NOTE — Telephone Encounter (Signed)
Patient returned my call.  Notified her that Otrexup was approved through her insurance and was a zero dollar co-pay.  Notified that Otrexup is currently on back order and she may have difficulty filling her prescription.  We have sent a PA for [place] in the event she is unable to obtain Otrexup.  Patient verbalized understanding.  Prescription for Otrexup pen and folic acid sent to [facility] per patient request.  She plans to inject her first dose of methotrexate tomorrow evening.  All questions encouraged and answered.  Instructed patient to call with any further questions or concerns.  Mariella Saa, PharmD, [place], [place] Clinical Specialty Pharmacist [phone]  06/22/2019 2:27 PM

## 2019-06-26 NOTE — Telephone Encounter (Signed)
Received a fax regarding Prior Authorization from [facility] for [name]. Authorization has been DENIED because she has not tried and failed Otrexup.  Will send document to scan center.  Patient has current approval and prescription for Otrexup.  Nothing further is needed at this time.  Patient previously instructed to call our office if she has difficulty obtaining Otrexup due to shortage.   Mariella Saa, PharmD, [place], [place] Clinical Specialty Pharmacist [phone]  06/26/2019 3:12 PM

## 2019-06-27 ENCOUNTER — Ambulatory Visit (INDEPENDENT_AMBULATORY_CARE_PROVIDER_SITE_OTHER): Payer: BC Managed Care – PPO | Admitting: Family Medicine

## 2019-06-27 ENCOUNTER — Other Ambulatory Visit: Payer: Self-pay

## 2019-06-27 ENCOUNTER — Encounter: Payer: Self-pay | Admitting: Family Medicine

## 2019-06-27 VITALS — BP 110/72 | HR 94 | Ht 62.0 in

## 2019-06-27 DIAGNOSIS — M545 Low back pain, unspecified: Secondary | ICD-10-CM

## 2019-06-27 DIAGNOSIS — G8929 Other chronic pain: Secondary | ICD-10-CM

## 2019-06-27 DIAGNOSIS — M999 Biomechanical lesion, unspecified: Secondary | ICD-10-CM | POA: Diagnosis not present

## 2019-06-27 NOTE — Assessment & Plan Note (Signed)
Facet arthropathy.  Patient is responding fairly well to osteopathic manipulation.  I do believe that potentially the psoriatic arthritis is also contributing.  Patient has recently started on methotrexate and we will see how patient responds.  Patient is getting 20 mg weekly.  Discussed with patient about icing regimen, the other topical anti-inflammatories, and home exercises and core strengthening.  Follow-up again in 4 to 8 weeks

## 2019-06-27 NOTE — Progress Notes (Signed)
Wendy [name] [address] [place], [place] [address] Phone: [phone] Subjective:   Wendy [name], am serving as a scribe for Dr. Hulan Saas.   CC: Back pain follow-up  [id number]  Wendy [name] is a 53 y.o. female coming in with complaint of back pain. Last seen on 05/30/2019. [name] change since last visit. Just started on methotrexate. Would like an refill on Armour.  Patient feels like she has been doing about the same.  Has not made any significant changes at the moment.  Does feel better though when she has the manipulation for couple weeks before the pain starts coming back.  Still having trouble working out on a regular basis.     Past Medical History:  Diagnosis Date  . Anal fissure 2004   Tx'd with diltiazem  gel by Dr. Lennie [name]  . Arthritis   . Bronchitis, acute 2008  . Seasonal allergies   . Thyroid disease   . URI 10/12/2007   Qualifier: Diagnosis of  By: Wendy Darner MD, Wendy [name]     Past Surgical History:  Procedure Laterality Date  . BREAST SURGERY  12/15/2017   breast reduction    Social History   Socioeconomic History  . Marital status: Married    Spouse name: Not on file  . Number of children: Not on file  . Years of education: Not on file  . Highest education level: Not on file  Occupational History  . Not on file  Social Needs  . Financial resource strain: Not on file  . Food insecurity    Worry: Not on file    Inability: Not on file  . Transportation needs    Medical: Not on file    Non-medical: Not on file  Tobacco Use  . Smoking status: Never Smoker  . Smokeless tobacco: Never Used  Substance and Sexual Activity  . Alcohol use: Yes    Comment: occ  . Drug use: [name]  . Sexual activity: Not on file  Lifestyle  . Physical activity    Days per week: Not on file    Minutes per session: Not on file  . Stress: Not on file  Relationships  . Social Herbalist on phone: Not on file    Gets together: Not  on file    Attends religious service: Not on file    Active member of club or organization: Not on file    Attends meetings of clubs or organizations: Not on file    Relationship status: Not on file  Other Topics Concern  . Not on file  Social History Narrative  . Not on file   Allergies  Allergen Reactions  . Pollen Extract   . Arava [Leflunomide] Other (See Comments)    Hair loss  . Methotrexate Derivatives Nausea Only   Family History  Problem Relation Age of Onset  . Hypertension Mother   . Diabetes Mother     Current Outpatient Medications (Endocrine & Metabolic):  .  levothyroxine (SYNTHROID, LEVOTHROID) 75 MCG tablet,  .  thyroid (ARMOUR) 30 MG tablet, Take 1 tablet (30 mg total) by mouth daily.   Current Outpatient Medications (Respiratory):  .  levocetirizine (XYZAL) 5 MG tablet, Take 5 mg by mouth daily.  Current Outpatient Medications (Analgesics):  Marland Kitchen  Methotrexate, PF, (OTREXUP) 20 MG/0.4ML SOAJ, Inject 20 mg into the skin once a week.  Current Outpatient Medications (Hematological):  Marland Kitchen  Cyanocobalamin (VITAMIN B 12 PO),  Take by mouth. .  folic acid (FOLVITE) 1 MG tablet, Take 2 tablets (2 mg total) by mouth daily. .  folic acid (FOLVITE) 1 MG tablet, Take 2 tablets (2 mg total) by mouth daily.  Current Outpatient Medications (Other):  Marland Kitchen.  Calcium Carbonate-Vit D-Min (CALCIUM 1200 PO), Take by mouth. .  COSENTYX SENSOREADY, 300 MG, 150 MG/ML SOAJ, INJECT TWO PENS UNDER THE SKIN EVERY 4 WEEKS. REFRIGERATE. ALLOW 15 TO30 MINUTES AT ROOM TEMP PRIOR TO ADMINISTRATION. .  Diclofenac Sodium 2 % SOLN, Place 2 g onto the skin 2 (two) times daily. (Patient taking differently: Place 2 g onto the skin 2 (two) times daily as needed. ) .  Omega-3 Fatty Acids (FISH OIL) 435 MG CAPS, Take by mouth. Marland Kitchen.  tiZANidine (ZANAFLEX) 4 MG tablet, Take 1 tablet (4 mg total) by mouth at bedtime. (Patient taking differently: Take 4 mg by mouth as needed. ) .  VITAMIN D PO, Take by mouth  daily. .  Vitamin D, Ergocalciferol, (DRISDOL) 1.25 MG (50000 UT) CAPS capsule, Take 1 capsule (50,000 Units total) by mouth every 30 (thirty) days.    Past medical history, social, surgical and family history all reviewed in electronic medical record.  [name] pertanent information unless stated regarding to the chief complaint.   Review of Systems:  [name] headache, visual changes, nausea, vomiting, diarrhea, constipation, dizziness, abdominal pain, skin rash, fevers, chills, night sweats, weight loss, swollen lymph nodes, body aches, joint swelling, muscle aches, chest pain, shortness of breath, mood changes.   Objective  Blood pressure 110/72, pulse 94, height 5\' 2"  (1.575 m), SpO2 98 %. Systems examined below as of    General: [name] apparent distress alert and oriented x3 mood and affect normal, dressed appropriately.  HEENT: Pupils equal, extraocular movements intact  Respiratory: Patient's speak in full sentences and does not appear short of breath  Cardiovascular: [name] lower extremity edema, non tender, [name] erythema  Skin: Warm dry intact with [name] signs of infection or rash on extremities or on axial skeleton.  Abdomen: Soft nontender  Neuro: Cranial nerves II through XII are intact, neurovascularly intact in all extremities with 2+ DTRs and 2+ pulses.  Lymph: [name] lymphadenopathy of posterior or anterior cervical chain or axillae bilaterally.  Gait normal with good balance and coordination.  MSK:  tender with full range of motion and good stability and symmetric strength and tone of shoulders, elbows, wrist, hip, knee and ankles bilaterally.  Very mild swelling of the MCP joints today. Back exam shows some mild loss of lordosis.  Patient does have some limited extension and sidebending of 5 to 10 degrees.  Patient has near full flexion.  Tender to palpation over the right sacroiliac joint.  Positive Pearlean BrownieFaber.  Negative straight leg test.  Osteopathic findings C4 flexed rotated and side bent left T7  extended rotated and side bent left L2 flexed rotated and side bent right Sacrum left on left  Impression and Recommendations:     This case required medical decision making of moderate complexity. The above documentation has been reviewed and is accurate and complete Wendy SaaZachary M Khadim Lundberg, DO       Note: This dictation was prepared with Dragon dictation along with smaller phrase technology. Any transcriptional errors that result from this process are unintentional.

## 2019-06-27 NOTE — Assessment & Plan Note (Signed)
Decision today to treat with OMT was based on Physical Exam  After verbal consent patient was treated with HVLA, ME, FPR techniques in  thoracic, lumbar and sacral areas  Patient tolerated the procedure well with improvement in symptoms  Patient given exercises, stretches and lifestyle modifications  See medications in patient instructions if given  Patient will follow up in 4-8 weeks 

## 2019-06-29 DIAGNOSIS — R112 Nausea with vomiting, unspecified: Secondary | ICD-10-CM | POA: Diagnosis not present

## 2019-06-29 DIAGNOSIS — Z20828 Contact with and (suspected) exposure to other viral communicable diseases: Secondary | ICD-10-CM | POA: Diagnosis not present

## 2019-06-30 ENCOUNTER — Telehealth: Payer: Self-pay | Admitting: *Deleted

## 2019-06-30 MED ORDER — ONDANSETRON HCL 4 MG PO TABS: 4.0000 mg | ORAL_TABLET | Freq: Four times a day (QID) | ORAL | 0 refills | Status: DC | PRN

## 2019-06-30 NOTE — Addendum Note (Signed)
Addended by: Carole Binning on: 06/30/2019 01:12 PM   Modules accepted: Orders

## 2019-06-30 NOTE — Telephone Encounter (Signed)
Prescription sent to the pharmacy.

## 2019-06-30 NOTE — Telephone Encounter (Signed)
Patient contacted the office stating she took her first injection of MTX on 06/25/19. Patient states she started vomiting on 06/28/19. Patient states is was all bile. Patient states she was unable to hold anything down even water. Patient states her oldest was travelling for work and has recently tested positive for COVID. Patient states he is quarantined to his room. Patient states she was tested for COVID on 06/29/19. Patient denies fever. Patient has complaints of weakness, a little coughing and throat irritation. Patient asked if the MTX would have caused the vomiting several days later or if this may be COVID related. Patient would also like to know if she should hold her MTX and Cosentyx. Please advise.

## 2019-06-30 NOTE — Telephone Encounter (Signed)
I spoke with [place].  She states that she has been still feeling tired and nauseated.  She has sore throat as well.  I am uncertain if she has COVID at this time.  I have advised her to hold off methotrexate and Cosentyx until her test comes back and she is out of the quarantine.  I also will call in Zofran 4 mg p.o. q6 hours as needed total 30 tablets in case she has to take it with her future methotrexate injections.  I have advised her to keep [place] posted regarding her test results.

## 2019-07-17 ENCOUNTER — Ambulatory Visit (INDEPENDENT_AMBULATORY_CARE_PROVIDER_SITE_OTHER): Payer: BC Managed Care – PPO | Admitting: Neurology

## 2019-07-17 DIAGNOSIS — G4710 Hypersomnia, unspecified: Secondary | ICD-10-CM | POA: Diagnosis not present

## 2019-07-17 DIAGNOSIS — G4719 Other hypersomnia: Secondary | ICD-10-CM

## 2019-07-17 DIAGNOSIS — L4050 Arthropathic psoriasis, unspecified: Secondary | ICD-10-CM

## 2019-07-17 DIAGNOSIS — R5383 Other fatigue: Secondary | ICD-10-CM

## 2019-07-17 DIAGNOSIS — G8929 Other chronic pain: Secondary | ICD-10-CM

## 2019-07-17 DIAGNOSIS — R0683 Snoring: Secondary | ICD-10-CM

## 2019-07-26 ENCOUNTER — Telehealth: Payer: Self-pay

## 2019-07-26 DIAGNOSIS — L853 Xerosis cutis: Secondary | ICD-10-CM | POA: Diagnosis not present

## 2019-07-26 DIAGNOSIS — L650 Telogen effluvium: Secondary | ICD-10-CM | POA: Diagnosis not present

## 2019-07-26 DIAGNOSIS — L649 Androgenic alopecia, unspecified: Secondary | ICD-10-CM | POA: Diagnosis not present

## 2019-07-26 NOTE — Telephone Encounter (Signed)
I called pt to discuss her sleep study results. No answer, left a message asking her to call me back. 

## 2019-07-26 NOTE — Telephone Encounter (Signed)
-----   Message from Larey Seat, MD sent at 07/26/2019  5:06 PM EDT -----  Summary & Diagnosis:    Very mild sleep apnea was noted at an AHI of 10.1/h and RDI of 12.8/h- this indicates there is moderate snoring associated.  Documented is also intermittent sinus bradycardia.   Recommendations:     Wendy [name] has a choice between CPAP, dental device or just concentrating on weight loss. This apnea is uncomplicated and mild, allowing for a long term solution.  I will be happy to make necessary referrals to dentistry or for PAP equipment. Physician Name:  Larey Seat, MD   07-25-2019

## 2019-07-26 NOTE — Procedures (Signed)
Patient Information     First Name: Wendy PortsGita N. Last Name: Elton Sinatel ID: [id number]  Birth Date: Oct 20, 1966 Age: [age]   Referring Provider:  Rodrigo RanMark Perini, MD BMI: 29.6 (W=161 lb, H=5' 2'')  Neck Circ.:  13 '' Epworth:  21   Sleep Study Information    Study Date: Jul 17, 2019 S/H/A Version: 5.1.77.7 / 4.0.1515 / 8677  History:     Wendy [name] is a 53 y.o. year old female patient of [place] descent seen on 06/07/2019. Chief concern according to patient:  "My children say that I snore"  (the children are 2322 and 54 years old).  And " I have snored for a while- and I gained weight due to inability to exercise"  I have the pleasure of seeing Wendy [name] today, a right -handed Asian female with a possible sleep disorder.  She has a thyroid disease, Psoriatic arthritis- causing painful joints.  She reports that iv therapy with monoclonal Ab have cleared the skin lesions but she noted more myalgia.     Summary & Diagnosis:     Very mild sleep apnea was noted at an AHI of 10.1/h and RDI of 12.8/h- this indicates there is moderate snoring associated.  Documented is also intermittent sinus bradycardia.   Recommendations:      Wendy [name] has a choice between CPAP, dental device or just concentrating on weight loss. This apnea is uncomplicated and mild, allowing for a long term solution.  I will be happy to make necessary referrals to dentistry or for PAP equipment. Physician Name:  Melvyn Novasarmen Alithia Zavaleta, MD   07-25-2019            Sleep Summary  Oxygen Saturation Statistics   Start Study Time: End Study Time: Total Recording Time:  11:05:55PM 6:11:12 AM 7 h, 5 min  Total Sleep Time % REM of Sleep Time:  6 h, 20 min 32.0    Mean: 96 Minimum: 85 Maximum: 99  Mean of Desaturations Nadirs (%):   92  Oxygen Desat. %:   4-9 10-20 >20 Total  Events Number Total    23  2 92.0 8.0  0 0.0  25 100.0  Oxygen Saturation: <90 <=88 <85 <80 <70  Duration (minutes): Sleep % 1.0 0.3  0.7 0.0  0.2 0.0 0.0  0.0 0.0 0.0     Respiratory Indices      Total Events REM NREM All Night  pRDI:  81  pAHI:  64 ODI:  25  pAHIc:  0  % CSR: 0.0 26.7 21.3 9.9 0.0 6.3 4.9 1.2 0.0 12.8 10.1 4.0 0.0       Pulse Rate Statistics during Sleep (BPM)      Mean: 68 Minimum: 38 Maximum: 100    Indices are calculated using technically valid sleep time of  6 h, 19 min. Central-Indices are calculated using technically valid sleep time of  5  h, 55 min. pRDI/ pAHI are calculated using 02 desaturations ? 3% Sit N/A Body Position Statistics  Position Supine Prone Right Left Non-Supine  Sleep (min) 144.5 28.0 123.3 85.0 236.3  Sleep % 38.0 7.4 32.4 22.3 62.0  pRDI 12.5 17.3 9.8 16.3 13.0  pAHI 12.5 8.6 6.8 11.3 8.7  ODI 7.9 0.0 1.5 2.1 1.5     Snoring Statistics Snoring Level (dB) >40 >50 >60 >70 >80 >Threshold (45)  Sleep (min) 237.9 41.4 1.6 0.0 0.0 64.3  Sleep % 62.5 10.9 0.4 0.0 0.0 16.9    Mean:  43 dB

## 2019-07-31 IMAGING — NM NUCLEAR MEDICINE WHOLE BODY BONE SCINTIGRAPHY
2 series · 2 of 2 positions shown · non-contrast
Comparison: None.

CLINICAL DATA: Joint pain.

EXAM:
NUCLEAR MEDICINE WHOLE BODY BONE SCAN
TECHNIQUE: Whole body anterior and posterior images were obtained approximately
3 hours after intravenous injection of radiopharmaceutical.
RADIOPHARMACEUTICALS:  19.8 mCi Xechnetium-YYm MDP IV

[Series 1: whole body · 2.66mm/px · 1 of 1 slices shown (1 of 2)]
[im 1/1]
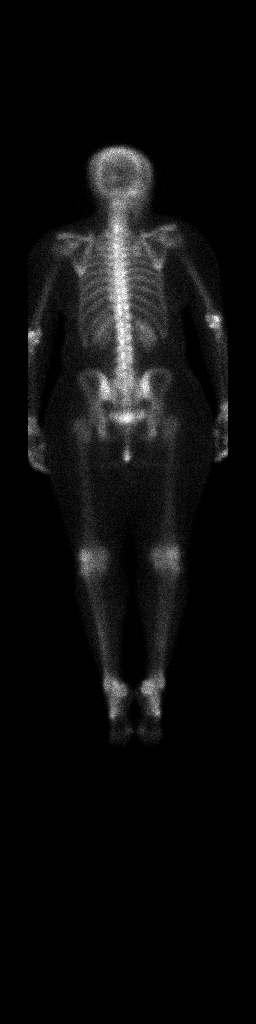

[Series 1: whole body · 2.66mm/px · 1 of 1 slices shown (2 of 2)]
[im 1/1]
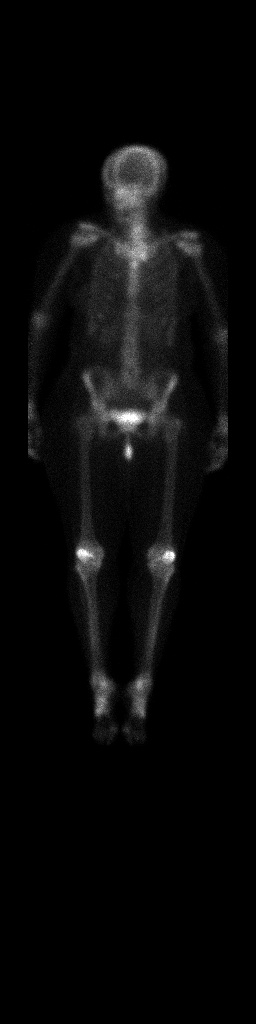

[2 of 2 positions shown; findings below may reference images not displayed]

FINDINGS: Increased radiotracer uptake within the bilateral knees, ankles, mid
tarsal joints, elbows, and wrists may represent arthritic changes.

Otherwise, physiologic distribution of radiotracer.
IMPRESSION: Increased radiotracer uptake within the bilateral knees, ankles, mid
tarsal joints, elbows, and wrists likely represents arthritic
changes.

## 2019-08-01 NOTE — Telephone Encounter (Signed)
I called pt and advised her of these results. Pt opted to work on weight loss and consider an oral appliance through her dentist. She declined a cpap and a follow up with [facility] at this time. She asked that I send these results to Dr. Joylene Draft. Pt verbalized understanding of results. Pt had no questions at this time but was encouraged to call back if questions arise.

## 2019-08-09 ENCOUNTER — Ambulatory Visit: Payer: BC Managed Care – PPO | Admitting: Family Medicine

## 2019-08-09 NOTE — Progress Notes (Deleted)
[name] [place], [place] [address] Phone: [phone] Subjective:    I'm seeing this patient by the request  of:    CC:   [id number]  Wendy [name] is a 53 y.o. female coming in with complaint of ***  Onset-  Location Duration-  Character- Aggravating factors- Reliving factors-  Therapies tried-  Severity-     Past Medical History:  Diagnosis Date  . Anal fissure 2004   Tx'd with diltiazem  gel by Dr. Lennie Hummer  . Arthritis   . Bronchitis, acute 2008  . Seasonal allergies   . Thyroid disease   . URI 10/12/2007   Qualifier: Diagnosis of  By: Linna Darner MD, Wendy [name]     Past Surgical History:  Procedure Laterality Date  . BREAST SURGERY  12/15/2017   breast reduction    Social History   Socioeconomic History  . Marital status: Married    Spouse name: Not on file  . Number of children: Not on file  . Years of education: Not on file  . Highest education level: Not on file  Occupational History  . Not on file  Social Needs  . Financial resource strain: Not on file  . Food insecurity    Worry: Not on file    Inability: Not on file  . Transportation needs    Medical: Not on file    Non-medical: Not on file  Tobacco Use  . Smoking status: Never Smoker  . Smokeless tobacco: Never Used  Substance and Sexual Activity  . Alcohol use: Yes    Comment: occ  . Drug use: No  . Sexual activity: Not on file  Lifestyle  . Physical activity    Days per week: Not on file    Minutes per session: Not on file  . Stress: Not on file  Relationships  . Social Herbalist on phone: Not on file    Gets together: Not on file    Attends religious service: Not on file    Active member of club or organization: Not on file    Attends meetings of clubs or organizations: Not on file    Relationship status: Not on file  Other Topics Concern  . Not on file  Social History Narrative  . Not on file   Allergies  Allergen  Reactions  . Pollen Extract   . Arava [Leflunomide] Other (See Comments)    Hair loss  . Methotrexate Derivatives Nausea Only   Family History  Problem Relation Age of Onset  . Hypertension Mother   . Diabetes Mother     Current Outpatient Medications (Endocrine & Metabolic):  .  levothyroxine (SYNTHROID, LEVOTHROID) 75 MCG tablet,  .  thyroid (ARMOUR) 30 MG tablet, Take 1 tablet (30 mg total) by mouth daily.   Current Outpatient Medications (Respiratory):  .  levocetirizine (XYZAL) 5 MG tablet, Take 5 mg by mouth daily.  Current Outpatient Medications (Analgesics):  Marland Kitchen  Methotrexate, PF, (OTREXUP) 20 MG/0.4ML SOAJ, Inject 20 mg into the skin once a week.  Current Outpatient Medications (Hematological):  Marland Kitchen  Cyanocobalamin (VITAMIN B 12 PO), Take by mouth. .  folic acid (FOLVITE) 1 MG tablet, Take 2 tablets (2 mg total) by mouth daily. .  folic acid (FOLVITE) 1 MG tablet, Take 2 tablets (2 mg total) by mouth daily.  Current Outpatient Medications (Other):  Marland Kitchen  Calcium Carbonate-Vit D-Min (CALCIUM 1200 PO), Take by mouth. Hillary Bow  SENSOREADY, 300 MG, 150 MG/ML SOAJ, INJECT TWO PENS UNDER THE SKIN EVERY 4 WEEKS. REFRIGERATE. ALLOW 15 TO30 MINUTES AT ROOM TEMP PRIOR TO ADMINISTRATION. .  Diclofenac Sodium 2 % SOLN, Place 2 g onto the skin 2 (two) times daily. (Patient taking differently: Place 2 g onto the skin 2 (two) times daily as needed. ) .  Omega-3 Fatty Acids (FISH OIL) 435 MG CAPS, Take by mouth. .  ondansetron (ZOFRAN) 4 MG tablet, Take 1 tablet (4 mg total) by mouth every 6 (six) hours as needed for nausea or vomiting. Marland Kitchen.  tiZANidine (ZANAFLEX) 4 MG tablet, Take 1 tablet (4 mg total) by mouth at bedtime. (Patient taking differently: Take 4 mg by mouth as needed. ) .  VITAMIN D PO, Take by mouth daily. .  Vitamin D, Ergocalciferol, (DRISDOL) 1.25 MG (50000 UT) CAPS capsule, Take 1 capsule (50,000 Units total) by mouth every 30 (thirty) days.    Past medical history,  social, surgical and family history all reviewed in electronic medical record.  No pertanent information unless stated regarding to the chief complaint.   Review of Systems:  No headache, visual changes, nausea, vomiting, diarrhea, constipation, dizziness, abdominal pain, skin rash, fevers, chills, night sweats, weight loss, swollen lymph nodes, body aches, joint swelling, muscle aches, chest pain, shortness of breath, mood changes.   Objective  There were no vitals taken for this visit. Systems examined below as of    General: No apparent distress alert and oriented x3 mood and affect normal, dressed appropriately.  HEENT: Pupils equal, extraocular movements intact  Respiratory: Patient's speak in full sentences and does not appear short of breath  Cardiovascular: No lower extremity edema, non tender, no erythema  Skin: Warm dry intact with no signs of infection or rash on extremities or on axial skeleton.  Abdomen: Soft nontender  Neuro: Cranial nerves II through XII are intact, neurovascularly intact in all extremities with 2+ DTRs and 2+ pulses.  Lymph: No lymphadenopathy of posterior or anterior cervical chain or axillae bilaterally.  Gait normal with good balance and coordination.  MSK:  Non tender with full range of motion and good stability and symmetric strength and tone of shoulders, elbows, wrist, hip, knee and ankles bilaterally.     Impression and Recommendations:     This case required medical decision making of moderate complexity. The above documentation has been reviewed and is accurate and complete Judi SaaZachary M Jourden Gilson, DO       Note: This dictation was prepared with Dragon dictation along with smaller phrase technology. Any transcriptional errors that result from this process are unintentional.

## 2019-08-11 DIAGNOSIS — Z23 Encounter for immunization: Secondary | ICD-10-CM | POA: Diagnosis not present

## 2019-08-11 DIAGNOSIS — E559 Vitamin D deficiency, unspecified: Secondary | ICD-10-CM | POA: Diagnosis not present

## 2019-08-11 DIAGNOSIS — E038 Other specified hypothyroidism: Secondary | ICD-10-CM | POA: Diagnosis not present

## 2019-09-04 NOTE — Progress Notes (Signed)
Office Visit Note  [name]             Date of Birth: 08-24-1966           MRN: [id number]             PCP: Crist Infante, MD Referring: Crist Infante, MD Visit Date: 09/18/2019 Occupation: @GUAROCC @  Subjective:  Pain in multiple joints.  History of Present Illness: Wendy [name] is a 53 y.o. female with history of psoriatic arthritis and psoriasis.  She acquired Covid and the last week of July.  She developed fatigue, joint aches, loss of taste and smell and sore throat which lasted for couple of weeks.  She was off the medications for about 2 months and then restarted Cosyntex.  She is a still not taking methotrexate.  Currently she is having discomfort in her knee joints and ankle joints.  Activities of Daily Living:  Patient reports morning stiffness for 15 minutes.   Patient Reports nocturnal pain.  Difficulty dressing/grooming: Denies Difficulty climbing stairs: Reports Difficulty getting out of chair: Reports Difficulty using hands for taps, buttons, cutlery, and/or writing: Denies  Review of Systems  Constitutional: Positive for fatigue. Negative for night sweats, weight gain and weight loss.  HENT: Positive for mouth dryness. Negative for mouth sores, trouble swallowing, trouble swallowing and nose dryness.   Eyes: Positive for dryness. Negative for pain, redness and visual disturbance.  Respiratory: Negative for cough, shortness of breath and difficulty breathing.   Cardiovascular: Negative for chest pain, palpitations, hypertension, irregular heartbeat and swelling in legs/feet.  Gastrointestinal: Negative for blood in stool, constipation and diarrhea.  Endocrine: Negative for increased urination.  Genitourinary: Negative for vaginal dryness.  Musculoskeletal: Positive for arthralgias, joint pain and morning stiffness. Negative for joint swelling, myalgias, muscle weakness, muscle tenderness and myalgias.  Skin: Negative for color change, rash, hair loss,  skin tightness, ulcers and sensitivity to sunlight.  Allergic/Immunologic: Negative for susceptible to infections.  Neurological: Negative for dizziness, memory loss, night sweats and weakness.  Hematological: Negative for swollen glands.  Psychiatric/Behavioral: Positive for sleep disturbance. Negative for depressed mood. The patient is not nervous/anxious.     PMFS History:  Patient Active Problem List   Diagnosis Date Noted  . Nonallopathic lesion of lumbosacral region 11/04/2018  . Nonallopathic lesion of sacral region 11/04/2018  . Nonallopathic lesion of thoracic region 11/04/2018  . Psoriasis 07/05/2017  . DJD (degenerative joint disease), cervical 02/03/2017  . Spondylosis of lumbar region without myelopathy or radiculopathy 02/03/2017  . High risk medication use 10/06/2016  . Plantar fasciitis 10/06/2016  . Achilles tendinitis of both lower extremities 10/06/2016  . Vitamin D deficiency 10/06/2016  . Primary osteoarthritis of both knees 10/06/2016  . Primary osteoarthritis of both feet 10/06/2016  . Chronic low back pain without sciatica 10/06/2016  . Hypothyroidism 10/06/2016  . Polyp of colon 10/06/2016  . B12 deficiency 10/06/2016  . Psoriatic arthropathy ([place]) 04/11/2012  . Anal fissure 04/11/2012    Past Medical History:  Diagnosis Date  . Anal fissure 2004   Tx'd with diltiazem  gel by Dr. Lennie Hummer  . Arthritis   . Bronchitis, acute 2008  . Seasonal allergies   . Thyroid disease   . URI 10/12/2007   Qualifier: Diagnosis of  By: Linna Darner MD, Virl Diamond History  Problem Relation Age of Onset  . Hypertension Mother   . Diabetes Mother    Past Surgical History:  Procedure Laterality  Date  . BREAST SURGERY  12/15/2017   breast reduction    Social History   Social History Narrative  . Not on file   Immunization History  Administered Date(s) Administered  . Pneumococcal Polysaccharide-23 10/28/2010  . Zoster Recombinat (Shingrix) 11/16/2018,  01/30/2019     Objective: Vital Signs: BP 136/90 (BP Location: Left Arm, Patient Position: Sitting, Cuff Size: Normal)   Pulse 69   Resp 12   Ht 5\' 2"  (1.575 m)   Wt 156 lb (70.8 kg)   BMI 28.53 kg/m    Physical Exam Vitals signs and nursing note reviewed.  Constitutional:      Appearance: She is well-developed.  HENT:     Head: Normocephalic and atraumatic.  Eyes:     Conjunctiva/sclera: Conjunctivae normal.  Neck:     Musculoskeletal: Normal range of motion.  Cardiovascular:     Rate and Rhythm: Normal rate and regular rhythm.     Heart sounds: Normal heart sounds.  Pulmonary:     Effort: Pulmonary effort is normal.     Breath sounds: Normal breath sounds.  Abdominal:     General: Bowel sounds are normal.     Palpations: Abdomen is soft.  Lymphadenopathy:     Cervical: No cervical adenopathy.  Skin:    General: Skin is warm and dry.     Capillary Refill: Capillary refill takes less than 2 seconds.  Neurological:     Mental Status: She is alert and oriented to person, place, and time.  Psychiatric:        Behavior: Behavior normal.      Musculoskeletal Exam: C-spine thoracic and lumbar spine were in good range of motion.  Shoulder joints, elbow joints, wrist joints, MCPs PIPs and DIPs with good range of motion with no synovitis.  She had no SI joint tenderness.  She has good range of motion of her hip joints.  She has some discomfort range of motion of her knee joints but no warmth swelling or effusion was noted.  She describes tenderness over medial aspect of her knee joints.  Ankle joints, MTPs and PIPs and DIPs been good range of motion with no synovitis.  She had mild tenderness over left Achilles tendon.  CDAI Exam: CDAI Score: - Patient Global: -; Provider Global: - Swollen: 0 ; Tender: 2  Joint Exam      Right  Left  Knee   Tender   Tender     Investigation: No additional findings.  Imaging: No results found.  Recent Labs: Lab Results  Component  Value Date   WBC 7.8 05/09/2019   HGB 12.9 05/09/2019   PLT 196 05/09/2019   NA 141 05/09/2019   K 4.0 05/09/2019   CL 106 05/09/2019   CO2 25 05/09/2019   GLUCOSE 92 05/09/2019   BUN 13 05/09/2019   CREATININE 0.89 05/09/2019   BILITOT 0.3 05/09/2019   ALKPHOS 77 05/27/2017   AST 22 05/09/2019   ALT 28 05/09/2019   PROT 7.0 05/09/2019   ALBUMIN 3.9 05/27/2017   CALCIUM 9.4 05/09/2019   GFRAA 86 05/09/2019   QFTBGOLDPLUS NEGATIVE 11/11/2018    Speciality Comments: Prior therapy:  failed Humira, Enbrel, Otezla, intolerance to oral methotrexate and Arava  Procedures:  No procedures performed Allergies: Pollen extract, Arava [leflunomide], and Methotrexate derivatives   Assessment / Plan:     Visit Diagnoses: Psoriatic arthritis (HCC)-arthritis seems to be quite well controlled.  She had no synovitis on examination.  Although she continues to  have joint discomfort.  She stopped Cosentyx and methotrexate after acquiring Covid in July 2020.  She was off both medications for 2 months and she resumed Cosentyx in September.  She is a still off methotrexate.  She will try adding methotrexate to see if she has any improvement in her joint symptoms.  Psoriasis-she has no active lesions.  High risk medication use - Cosentyx 300 mg every 28 days, Otrexup 20 mg every 7 days, (still on hold per patient.)  And folic acid 2 mg daily.  Last TB gold negative on 11/11/2018 and will monitor yearly.  Future order for TB gold placed.  Most recent CBC/CMP within normal limits on 05/09/2019.  Due for CBC/CMP today and will monitor every 3 months. - Plan: CBC with Differential/Platelet, COMPLETE METABOLIC PANEL WITH GFR, QuantiFERON-TB Gold Plus  Myalgia-she continues to have some chronic myalgias.  Chronic pain of both knees-I reviewed x-rays of her knee joints today which shows moderate osteoarthritis.  I offered Visco supplement injections but she would like to hold off for right now.  Primary  osteoarthritis of both knees  Chronic pain of both ankles-she complains of knee joint discomfort.  No synovitis was noted.  She has mild tenderness over left Achilles tendon.  Primary osteoarthritis of both feet-she has chronic discomfort.  Other fatigue-she has mild chronic fatigue.  The fatigue increased after Covid- 19 infection but it is improving now.  Sacroiliac pain-currently not having much discomfort.  Spondylosis of lumbar region without myelopathy or radiculopathy-she is chronic mild discomfort.  DDD (degenerative disc disease), cervical-she had good range of motion.  History of vitamin D deficiency her last vitamin D level in December 2019 was normal.  She has been taking the supplement.  We will check the levels today as she has been experiencing fatigue.  History of hypothyroidism  Orders: Orders Placed This Encounter  Procedures  . CBC with Differential/Platelet  . COMPLETE METABOLIC PANEL WITH GFR  . QuantiFERON-TB Gold Plus  . VITAMIN D 25 Hydroxy (Vit-D Deficiency, Fractures)   No orders of the defined types were placed in this encounter.    Follow-Up Instructions: Return in about 3 months (around 12/19/2019) for Psoriatic arthritis.   Pollyann Savoy, MD  Note - This record has been created using Animal nutritionist.  Chart creation errors have been sought, but may not always  have been located. Such creation errors do not reflect on  the standard of medical care.

## 2019-09-18 ENCOUNTER — Encounter: Payer: Self-pay | Admitting: Rheumatology

## 2019-09-18 ENCOUNTER — Ambulatory Visit: Payer: BC Managed Care – PPO | Admitting: Rheumatology

## 2019-09-18 ENCOUNTER — Other Ambulatory Visit: Payer: Self-pay

## 2019-09-18 VITALS — BP 136/90 | HR 69 | Resp 12 | Ht 62.0 in | Wt 156.0 lb

## 2019-09-18 DIAGNOSIS — Z8639 Personal history of other endocrine, nutritional and metabolic disease: Secondary | ICD-10-CM

## 2019-09-18 DIAGNOSIS — L4050 Arthropathic psoriasis, unspecified: Secondary | ICD-10-CM

## 2019-09-18 DIAGNOSIS — M791 Myalgia, unspecified site: Secondary | ICD-10-CM | POA: Diagnosis not present

## 2019-09-18 DIAGNOSIS — L409 Psoriasis, unspecified: Secondary | ICD-10-CM | POA: Diagnosis not present

## 2019-09-18 DIAGNOSIS — M25571 Pain in right ankle and joints of right foot: Secondary | ICD-10-CM

## 2019-09-18 DIAGNOSIS — M19072 Primary osteoarthritis, left ankle and foot: Secondary | ICD-10-CM

## 2019-09-18 DIAGNOSIS — M533 Sacrococcygeal disorders, not elsewhere classified: Secondary | ICD-10-CM

## 2019-09-18 DIAGNOSIS — R5383 Other fatigue: Secondary | ICD-10-CM

## 2019-09-18 DIAGNOSIS — M19071 Primary osteoarthritis, right ankle and foot: Secondary | ICD-10-CM

## 2019-09-18 DIAGNOSIS — M170 Bilateral primary osteoarthritis of knee: Secondary | ICD-10-CM

## 2019-09-18 DIAGNOSIS — Z79899 Other long term (current) drug therapy: Secondary | ICD-10-CM | POA: Diagnosis not present

## 2019-09-18 DIAGNOSIS — M47816 Spondylosis without myelopathy or radiculopathy, lumbar region: Secondary | ICD-10-CM

## 2019-09-18 DIAGNOSIS — M25562 Pain in left knee: Secondary | ICD-10-CM

## 2019-09-18 DIAGNOSIS — M5030 Other cervical disc degeneration, unspecified cervical region: Secondary | ICD-10-CM

## 2019-09-18 DIAGNOSIS — G8929 Other chronic pain: Secondary | ICD-10-CM

## 2019-09-18 DIAGNOSIS — M25572 Pain in left ankle and joints of left foot: Secondary | ICD-10-CM

## 2019-09-18 DIAGNOSIS — M25561 Pain in right knee: Secondary | ICD-10-CM

## 2019-09-19 ENCOUNTER — Telehealth: Payer: Self-pay | Admitting: *Deleted

## 2019-09-19 DIAGNOSIS — Z8639 Personal history of other endocrine, nutritional and metabolic disease: Secondary | ICD-10-CM

## 2019-09-19 LAB — CBC WITH DIFFERENTIAL/PLATELET
Absolute Monocytes: 346 cells/uL (ref 200–950)
Basophils Absolute: 38 cells/uL (ref 0–200)
Basophils Relative: 0.7 %
Eosinophils Absolute: 340 cells/uL (ref 15–500)
Eosinophils Relative: 6.3 %
HCT: 40.7 % (ref 35.0–45.0)
Hemoglobin: 13.3 g/dL (ref 11.7–15.5)
Lymphs Abs: 1939 cells/uL (ref 850–3900)
MCH: 27.9 pg (ref 27.0–33.0)
MCHC: 32.7 g/dL (ref 32.0–36.0)
MCV: 85.3 fL (ref 80.0–100.0)
MPV: 12.8 fL — ABNORMAL HIGH (ref 7.5–12.5)
Monocytes Relative: 6.4 %
Neutro Abs: 2738 cells/uL (ref 1500–7800)
Neutrophils Relative %: 50.7 %
Platelets: 198 10*3/uL (ref 140–400)
RBC: 4.77 10*6/uL (ref 3.80–5.10)
RDW: 13.1 % (ref 11.0–15.0)
Total Lymphocyte: 35.9 %
WBC: 5.4 10*3/uL (ref 3.8–10.8)

## 2019-09-19 LAB — COMPLETE METABOLIC PANEL WITH GFR
AG Ratio: 1.3 (calc) (ref 1.0–2.5)
ALT: 49 U/L — ABNORMAL HIGH (ref 6–29)
AST: 29 U/L (ref 10–35)
Albumin: 4 g/dL (ref 3.6–5.1)
Alkaline phosphatase (APISO): 80 U/L (ref 37–153)
BUN: 9 mg/dL (ref 7–25)
CO2: 29 mmol/L (ref 20–32)
Calcium: 9.6 mg/dL (ref 8.6–10.4)
Chloride: 106 mmol/L (ref 98–110)
Creat: 0.71 mg/dL (ref 0.50–1.05)
GFR, Est African American: 113 mL/min/{1.73_m2} (ref >=60)
GFR, Est Non African American: 97 mL/min/{1.73_m2} (ref >=60)
Globulin: 3 g/dL (calc) (ref 1.9–3.7)
Glucose, Bld: 90 mg/dL (ref 65–99)
Potassium: 4.6 mmol/L (ref 3.5–5.3)
Sodium: 140 mmol/L (ref 135–146)
Total Bilirubin: 0.6 mg/dL (ref 0.2–1.2)
Total Protein: 7 g/dL (ref 6.1–8.1)

## 2019-09-19 LAB — VITAMIN D 25 HYDROXY (VIT D DEFICIENCY, FRACTURES): Vit D, 25-Hydroxy: 28 ng/mL — ABNORMAL LOW (ref 30–100)

## 2019-09-19 MED ORDER — VITAMIN D (ERGOCALCIFEROL) 1.25 MG (50000 UNIT) PO CAPS: 50000.0000 [IU] | ORAL_CAPSULE | ORAL | 0 refills | Status: DC

## 2019-09-19 NOTE — Progress Notes (Signed)
LFTs are elevated.  If patient is taking anti-inflammatories she stopped taking.  I would hold off methotrexate.  The plan was to start methotrexate.  Please call in vitamin D 50,000 units/week for 3 months.  Recheck vitamin D and standing orders.  Advise abstinence from alcohol.

## 2019-09-19 NOTE — Progress Notes (Signed)
LFTs in 1 month 

## 2019-09-19 NOTE — Telephone Encounter (Signed)
-----   Message from Bo Merino, MD sent at 09/19/2019  8:48 AM EDT ----- LFTs are elevated.  If patient is taking anti-inflammatories she stopped taking.  I would hold off methotrexate.  The plan was to start methotrexate.  Please call in vitamin D 50,000 units/week for 3 months.  Recheck vitamin D and standing orders.  A dvise abstinence from alcohol.

## 2019-09-26 DIAGNOSIS — U071 COVID-19: Secondary | ICD-10-CM | POA: Diagnosis not present

## 2019-10-11 ENCOUNTER — Other Ambulatory Visit: Payer: Self-pay

## 2019-10-11 ENCOUNTER — Telehealth: Payer: Self-pay | Admitting: *Deleted

## 2019-10-11 MED ORDER — THYROID 30 MG PO TABS: 30.0000 mg | ORAL_TABLET | Freq: Every day | ORAL | 3 refills | Status: DC

## 2019-10-11 NOTE — Telephone Encounter (Signed)
Talked to patient. Sending in refill

## 2019-10-11 NOTE — Telephone Encounter (Signed)
Pt left msg stating that her pharmacy told her that her refill for Armour Thyroid was denied. Pt wanted to know if Dr. Tamala Julian wanted her to stop taking the med or if she needs to schedule a f/u visit.

## 2019-10-16 DIAGNOSIS — Z1231 Encounter for screening mammogram for malignant neoplasm of breast: Secondary | ICD-10-CM | POA: Diagnosis not present

## 2019-10-16 DIAGNOSIS — Z1151 Encounter for screening for human papillomavirus (HPV): Secondary | ICD-10-CM | POA: Diagnosis not present

## 2019-10-16 DIAGNOSIS — Z6829 Body mass index (BMI) 29.0-29.9, adult: Secondary | ICD-10-CM | POA: Diagnosis not present

## 2019-10-16 DIAGNOSIS — Z01419 Encounter for gynecological examination (general) (routine) without abnormal findings: Secondary | ICD-10-CM | POA: Diagnosis not present

## 2019-10-19 ENCOUNTER — Ambulatory Visit: Payer: BC Managed Care – PPO | Admitting: Rheumatology

## 2019-11-16 DIAGNOSIS — H11153 Pinguecula, bilateral: Secondary | ICD-10-CM | POA: Diagnosis not present

## 2019-11-16 DIAGNOSIS — H04123 Dry eye syndrome of bilateral lacrimal glands: Secondary | ICD-10-CM | POA: Diagnosis not present

## 2019-12-13 NOTE — Progress Notes (Deleted)
Office Visit Note  [name]             Date of Birth: 1966/06/03           MRN: [id number]             PCP: Rodrigo Ran, MD Referring: Rodrigo Ran, MD Visit Date: 12/18/2019 Occupation: @GUAROCC @  Subjective:  No chief complaint on file.   History of Present Illness: Wendy [name] is a 54 y.o. female ***   Activities of Daily Living:  Patient reports morning stiffness for *** {minute/hour:19697}.   Patient {ACTIONS;DENIES/REPORTS:21021675::"Denies"} nocturnal pain.  Difficulty dressing/grooming: {ACTIONS;DENIES/REPORTS:21021675::"Denies"} Difficulty climbing stairs: {ACTIONS;DENIES/REPORTS:21021675::"Denies"} Difficulty getting out of chair: {ACTIONS;DENIES/REPORTS:21021675::"Denies"} Difficulty using hands for taps, buttons, cutlery, and/or writing: {ACTIONS;DENIES/REPORTS:21021675::"Denies"}  No Rheumatology ROS completed.   PMFS History:  Patient Active Problem List   Diagnosis Date Noted  . Nonallopathic lesion of lumbosacral region 11/04/2018  . Nonallopathic lesion of sacral region 11/04/2018  . Nonallopathic lesion of thoracic region 11/04/2018  . Psoriasis 07/05/2017  . DJD (degenerative joint disease), cervical 02/03/2017  . Spondylosis of lumbar region without myelopathy or radiculopathy 02/03/2017  . High risk medication use 10/06/2016  . Plantar fasciitis 10/06/2016  . Achilles tendinitis of both lower extremities 10/06/2016  . Vitamin D deficiency 10/06/2016  . Primary osteoarthritis of both knees 10/06/2016  . Primary osteoarthritis of both feet 10/06/2016  . Chronic low back pain without sciatica 10/06/2016  . Hypothyroidism 10/06/2016  . Polyp of colon 10/06/2016  . B12 deficiency 10/06/2016  . 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 Anal fissure 04/11/2012    Past Medical History:  Diagnosis Date  . Anal fissure 2004   Tx'd with diltiazem  gel by Dr. 2005  . Arthritis   . Bronchitis, acute 2008  . Seasonal allergies   .  Thyroid disease   . URI 10/12/2007   Qualifier: Diagnosis of  By: 13/10/2007 MD, Alwyn Ren History  Problem Relation Age of Onset  . Hypertension Mother   . Diabetes Mother    Past Surgical History:  Procedure Laterality Date  . BREAST SURGERY  12/15/2017   breast reduction    Social History   Social History Narrative  . Not on file   Immunization History  Administered Date(s) Administered  . Pneumococcal Polysaccharide-23 10/28/2010  . Zoster Recombinat (Shingrix) 11/16/2018, 01/30/2019     Objective: Vital Signs: There were no vitals taken for this visit.   Physical Exam   Musculoskeletal Exam: ***  CDAI Exam: CDAI Score: -- Patient Global: --; Provider Global: -- Swollen: --; Tender: -- Joint Exam 12/18/2019   No joint exam has been documented for this visit   There is currently no information documented on the homunculus. Go to the Rheumatology activity and complete the homunculus joint exam.  Investigation: No additional findings.  Imaging: No results found.  Recent Labs: Lab Results  Component Value Date   WBC 5.4 09/18/2019   HGB 13.3 09/18/2019   PLT 198 09/18/2019   NA 140 09/18/2019   K 4.6 09/18/2019   CL 106 09/18/2019   CO2 29 09/18/2019   GLUCOSE 90 09/18/2019   BUN 9 09/18/2019   CREATININE 0.71 09/18/2019   BILITOT 0.6 09/18/2019   ALKPHOS 77 05/27/2017   AST 29 09/18/2019   ALT 49 (H) 09/18/2019   PROT 7.0 09/18/2019   ALBUMIN 3.9 05/27/2017   CALCIUM 9.6 09/18/2019   GFRAA 113 09/18/2019   QFTBGOLDPLUS NEGATIVE 11/11/2018  Speciality Comments: Prior therapy:  failed Humira, Enbrel, Otezla, intolerance to oral methotrexate and Arava  Procedures:  No procedures performed Allergies: Pollen extract, Arava [leflunomide], and Methotrexate derivatives   Assessment / Plan:     Visit Diagnoses: No diagnosis found.  Orders: No orders of the defined types were placed in this encounter.  No orders of the defined types  were placed in this encounter.   Face-to-face time spent with patient was *** minutes. Greater than 50% of time was spent in counseling and coordination of care.  Follow-Up Instructions: No follow-ups on file.   Earnestine Mealing, CMA  Note - This record has been created using Editor, commissioning.  Chart creation errors have been sought, but may not always  have been located. Such creation errors do not reflect on  the standard of medical care.

## 2019-12-14 ENCOUNTER — Other Ambulatory Visit: Payer: Self-pay | Admitting: Rheumatology

## 2019-12-14 DIAGNOSIS — L4050 Arthropathic psoriasis, unspecified: Secondary | ICD-10-CM

## 2019-12-14 NOTE — Telephone Encounter (Signed)
Last Visit: 09/18/2019 Next Visit: 12/19/2019 Labs: 09/18/2019 LFTs are elevated.  TB Gold: 11/11/2018  Advised patient she is due to update labs, patient verbalized understanding and we have rescheduled appointment to 12/19/2019 to ensure the lab tech will be here to draw the labs.   Okay to refill per Dr. Corliss Skains.    Patient also questioned the COVID-19 vaccine and I advised patient that Dr. Corliss Skains does recommend the COVID-19 vaccine and it is okay to receive the vaccine since it is not live.

## 2019-12-18 ENCOUNTER — Ambulatory Visit: Payer: BC Managed Care – PPO | Admitting: Rheumatology

## 2019-12-18 NOTE — Progress Notes (Signed)
Office Visit Note  [name]             Date of Birth: Mar 06, 1966           MRN: [id number]             PCP: Rodrigo Ran, MD Referring: Rodrigo Ran, MD Visit Date: 12/19/2019 Occupation: @GUAROCC @  Subjective:  Pain in bilateral knee joints and itchiness of the scalp.   History of Present Illness: Wendy [name] is a 54 y.o. female with history of psoriatic arthritis and psoriasis.  She states she has been experiencing some itchiness in her scalp.  She does not have psoriasis anywhere else.  She also continues to have some discomfort in her knee joints and stiffness in her hands.  There is no joint swelling.  She still feels a bit stiffness in her SI joints for which she has been doing Pilates.  She has been doing stretches for her feet due to some discomfort from plantar fasciitis.  Activities of Daily Living:  Patient reports morning stiffness for 30 minutes.   Patient Reports nocturnal pain.  Difficulty dressing/grooming: Denies Difficulty climbing stairs: Reports Difficulty getting out of chair: Denies Difficulty using hands for taps, buttons, cutlery, and/or writing: Reports  Review of Systems  Constitutional: Positive for fatigue. Negative for night sweats, weight gain and weight loss.  HENT: Negative for mouth sores, trouble swallowing, trouble swallowing, mouth dryness and nose dryness.   Eyes: Negative for pain, redness, visual disturbance and dryness.  Respiratory: Negative for cough, shortness of breath and difficulty breathing.   Cardiovascular: Negative for chest pain, palpitations, hypertension, irregular heartbeat and swelling in legs/feet.  Gastrointestinal: Negative for blood in stool, constipation and diarrhea.  Endocrine: Negative for increased urination.  Genitourinary: Negative for vaginal dryness.  Musculoskeletal: Positive for arthralgias, joint pain, myalgias, morning stiffness and myalgias. Negative for joint swelling, muscle weakness and  muscle tenderness.  Skin: Positive for hair loss. Negative for color change, rash, skin tightness, ulcers and sensitivity to sunlight.  Allergic/Immunologic: Negative for susceptible to infections.  Neurological: Negative for dizziness, memory loss, night sweats and weakness.  Hematological: Negative for swollen glands.  Psychiatric/Behavioral: Positive for sleep disturbance. Negative for depressed mood. The patient is not nervous/anxious.     PMFS History:  Patient Active Problem List   Diagnosis Date Noted  . Nonallopathic lesion of lumbosacral region 11/04/2018  . Nonallopathic lesion of sacral region 11/04/2018  . Nonallopathic lesion of thoracic region 11/04/2018  . Psoriasis 07/05/2017  . DJD (degenerative joint disease), cervical 02/03/2017  . Spondylosis of lumbar region without myelopathy or radiculopathy 02/03/2017  . High risk medication use 10/06/2016  . Plantar fasciitis 10/06/2016  . Achilles tendinitis of both lower extremities 10/06/2016  . Vitamin D deficiency 10/06/2016  . Primary osteoarthritis of both knees 10/06/2016  . Primary osteoarthritis of both feet 10/06/2016  . Chronic low back pain without sciatica 10/06/2016  . Hypothyroidism 10/06/2016  . Polyp of colon 10/06/2016  . B12 deficiency 10/06/2016  . 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 Anal fissure 04/11/2012    Past Medical History:  Diagnosis Date  . Anal fissure 2004   Tx'd with diltiazem  gel by Dr. 2005  . Arthritis   . Bronchitis, acute 2008  . Seasonal allergies   . Thyroid disease   . URI 10/12/2007   Qualifier: Diagnosis of  By: 13/10/2007 MD, Alwyn Ren History  Problem Relation Age of Onset  .  Hypertension Mother   . Diabetes Mother    Past Surgical History:  Procedure Laterality Date  . BREAST SURGERY  12/15/2017   breast reduction    Social History   Social History Narrative  . Not on file   Immunization History  Administered Date(s) Administered  .  Pneumococcal Polysaccharide-23 10/28/2010  . Zoster Recombinat (Shingrix) 11/16/2018, 01/30/2019     Objective: Vital Signs: BP 117/82 (BP Location: Left Arm, Patient Position: Sitting, Cuff Size: Small)   Pulse 83   Resp 12   Ht 5\' 2"  (1.575 m)   Wt 153 lb 12.8 oz (69.8 kg)   BMI 28.13 kg/m    Physical Exam Vitals and nursing note reviewed.  Constitutional:      Appearance: She is well-developed.  HENT:     Head: Normocephalic and atraumatic.  Eyes:     Conjunctiva/sclera: Conjunctivae normal.  Cardiovascular:     Rate and Rhythm: Normal rate and regular rhythm.     Heart sounds: Normal heart sounds.  Pulmonary:     Effort: Pulmonary effort is normal.     Breath sounds: Normal breath sounds.  Abdominal:     General: Bowel sounds are normal.     Palpations: Abdomen is soft.  Musculoskeletal:     Cervical back: Normal range of motion.  Lymphadenopathy:     Cervical: No cervical adenopathy.  Skin:    General: Skin is warm and dry.     Capillary Refill: Capillary refill takes less than 2 seconds.  Neurological:     Mental Status: She is alert and oriented to person, place, and time.  Psychiatric:        Behavior: Behavior normal.      Musculoskeletal Exam: C-spine thoracic and lumbar spine were in good range of motion.  She had mild tenderness over SI joints.  Shoulder joints, elbow joints, wrist joints, MCPs PIPs and DIPs with good range of motion with no synovitis.  Hip joints, knee joints, ankles, MTPs and PIPs with good range of motion with no synovitis.  She has some discomfort range of motion of her knee joints.  CDAI Exam: CDAI Score: -- Patient Global: --; Provider Global: -- Swollen: --; Tender: -- Joint Exam 12/19/2019   No joint exam has been documented for this visit   There is currently no information documented on the homunculus. Go to the Rheumatology activity and complete the homunculus joint exam.  Investigation: No additional  findings.  Imaging: No results found.  Recent Labs: Lab Results  Component Value Date   WBC 5.4 09/18/2019   HGB 13.3 09/18/2019   PLT 198 09/18/2019   NA 140 09/18/2019   K 4.6 09/18/2019   CL 106 09/18/2019   CO2 29 09/18/2019   GLUCOSE 90 09/18/2019   BUN 9 09/18/2019   CREATININE 0.71 09/18/2019   BILITOT 0.6 09/18/2019   ALKPHOS 77 05/27/2017   AST 29 09/18/2019   ALT 49 (H) 09/18/2019   PROT 7.0 09/18/2019   ALBUMIN 3.9 05/27/2017   CALCIUM 9.6 09/18/2019   GFRAA 113 09/18/2019   QFTBGOLDPLUS NEGATIVE 11/11/2018    Speciality Comments: Prior therapy:  failed Humira, Enbrel, Otezla, intolerance to oral methotrexate and Arava  Procedures:  No procedures performed Allergies: Pollen extract, Arava [leflunomide], and Methotrexate derivatives   Assessment / Plan:     Visit Diagnoses: Psoriatic arthritis ([place]) - She stopped Cosentyx and methotrexate after acquiring Covid in July 2020.  She was off both medications for 2 months and she  resumed Cosentyx in September.  She decided not to restart methotrexate.  She continues to have some joint pain but no synovitis was noted.- Plan: Sedimentation rate.  At this time she does not want to restart methotrexate.  Psoriasis-she had no active psoriasis patches.  She has been having some pruritus in her scalp but no active psoriasis lesions were noted.  I believe she has dry skin.  High risk medication use - Cosentyx 300 mg every 28 days, Otrexup 20 mg every 7 days (she took only 1 dose and never restarted it.),  And folic acid 2 mg daily. Last TB gold negative on 11/11/2018  - Plan: CBC with Differential/Platelet, COMPLETE METABOLIC PANEL WITH GFR, QuantiFERON-TB Gold Plus  Sacroiliac pain-she has chronic SI joint pain.  She states the discomfort is mild currently.  Primary osteoarthritis of both knees-she has chronic knee joint discomfort.  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  No synovitis was noted.  Primary  osteoarthritis of both feet-chronic mild discomfort.  DDD (degenerative disc disease), cervical - with moderate spinal stenosis  DDD (degenerative disc disease), lumbar -she is currently not having much discomfort.  History of vitamin D deficiency-she finished vitamin D for 3 months and is currently on vitamin D 2000 units a day.  Plan: VITAMIN D 25 Hydroxy (Vit-D Deficiency, Fractures)  Myalgia  Other fatigue-probably related to insomnia.  History of hypothyroidism  Orders: Orders Placed This Encounter  Procedures  . CBC with Differential/Platelet  . COMPLETE METABOLIC PANEL WITH GFR  . Sedimentation rate  . VITAMIN D 25 Hydroxy (Vit-D Deficiency, Fractures)  . QuantiFERON-TB Gold Plus   No orders of the defined types were placed in this encounter.   Face-to-face time spent with patient was 30 minutes. Greater than 50% of time was spent in counseling and coordination of care.  Follow-Up Instructions: Return in about 3 months (around 03/18/2020) for Psoriatic arthritis.   Pollyann Savoy, MD  Note - This record has been created using Animal nutritionist.  Chart creation errors have been sought, but may not always  have been located. Such creation errors do not reflect on  the standard of medical care.

## 2019-12-19 ENCOUNTER — Encounter: Payer: Self-pay | Admitting: Rheumatology

## 2019-12-19 ENCOUNTER — Other Ambulatory Visit: Payer: Self-pay

## 2019-12-19 ENCOUNTER — Ambulatory Visit: Payer: BC Managed Care – PPO | Admitting: Rheumatology

## 2019-12-19 VITALS — BP 117/82 | HR 83 | Resp 12 | Ht 62.0 in | Wt 153.8 lb

## 2019-12-19 DIAGNOSIS — Z111 Encounter for screening for respiratory tuberculosis: Secondary | ICD-10-CM | POA: Diagnosis not present

## 2019-12-19 DIAGNOSIS — Z8639 Personal history of other endocrine, nutritional and metabolic disease: Secondary | ICD-10-CM

## 2019-12-19 DIAGNOSIS — L4050 Arthropathic psoriasis, unspecified: Secondary | ICD-10-CM

## 2019-12-19 DIAGNOSIS — M170 Bilateral primary osteoarthritis of knee: Secondary | ICD-10-CM

## 2019-12-19 DIAGNOSIS — Z79899 Other long term (current) drug therapy: Secondary | ICD-10-CM | POA: Diagnosis not present

## 2019-12-19 DIAGNOSIS — M533 Sacrococcygeal disorders, not elsewhere classified: Secondary | ICD-10-CM

## 2019-12-19 DIAGNOSIS — M19071 Primary osteoarthritis, right ankle and foot: Secondary | ICD-10-CM

## 2019-12-19 DIAGNOSIS — M791 Myalgia, unspecified site: Secondary | ICD-10-CM

## 2019-12-19 DIAGNOSIS — L409 Psoriasis, unspecified: Secondary | ICD-10-CM

## 2019-12-19 DIAGNOSIS — M5136 Other intervertebral disc degeneration, lumbar region: Secondary | ICD-10-CM | POA: Diagnosis not present

## 2019-12-19 DIAGNOSIS — M51369 Other intervertebral disc degeneration, lumbar region without mention of lumbar back pain or lower extremity pain: Secondary | ICD-10-CM

## 2019-12-19 DIAGNOSIS — M19072 Primary osteoarthritis, left ankle and foot: Secondary | ICD-10-CM

## 2019-12-19 DIAGNOSIS — M5030 Other cervical disc degeneration, unspecified cervical region: Secondary | ICD-10-CM

## 2019-12-19 DIAGNOSIS — R5383 Other fatigue: Secondary | ICD-10-CM

## 2019-12-19 NOTE — Patient Instructions (Signed)
Standing Labs We placed an order today for your standing lab work.    Please come back and get your standing labs in April and every 3 months   We have open lab daily Monday through Thursday from 8:30-12:30 PM and 1:30-4:30 PM and Friday from 8:30-12:30 PM and 1:30-4:00 PM at the office of Dr. Kieren Adkison.   You may experience shorter wait times on Monday and Friday afternoons. The office is located at [address], [address], [place], [place] [address] No appointment is necessary.   Labs are drawn by [facility].  You may receive a bill from [facility] for your lab work.  If you wish to have your labs drawn at another location, please call the office 24 hours in advance to send orders.  If you have any questions regarding directions or hours of operation,  please call [phone].   Just as a reminder please drink plenty of water prior to coming for your lab work. Thanks!   

## 2019-12-20 NOTE — Progress Notes (Signed)
CBC is normal.  ALT is mildly elevated at 36 (improved).  Please advised to avoid all NSAIDs.

## 2019-12-21 LAB — QUANTIFERON-TB GOLD PLUS
Mitogen-NIL: >10 IU/mL
NIL: 0.03 IU/mL
QuantiFERON-TB Gold Plus: NEGATIVE
TB1-NIL: 0 IU/mL
TB2-NIL: <0 IU/mL

## 2019-12-21 LAB — CBC WITH DIFFERENTIAL/PLATELET
Absolute Monocytes: 360 cells/uL (ref 200–950)
Basophils Absolute: 62 cells/uL (ref 0–200)
Basophils Relative: 1 %
Eosinophils Absolute: 347 cells/uL (ref 15–500)
Eosinophils Relative: 5.6 %
HCT: 41.2 % (ref 35.0–45.0)
Hemoglobin: 13.5 g/dL (ref 11.7–15.5)
Lymphs Abs: 2275 cells/uL (ref 850–3900)
MCH: 27.9 pg (ref 27.0–33.0)
MCHC: 32.8 g/dL (ref 32.0–36.0)
MCV: 85.1 fL (ref 80.0–100.0)
MPV: 12.3 fL (ref 7.5–12.5)
Monocytes Relative: 5.8 %
Neutro Abs: 3156 cells/uL (ref 1500–7800)
Neutrophils Relative %: 50.9 %
Platelets: 200 10*3/uL (ref 140–400)
RBC: 4.84 10*6/uL (ref 3.80–5.10)
RDW: 12.6 % (ref 11.0–15.0)
Total Lymphocyte: 36.7 %
WBC: 6.2 10*3/uL (ref 3.8–10.8)

## 2019-12-21 LAB — COMPLETE METABOLIC PANEL WITH GFR
AG Ratio: 1.4 (calc) (ref 1.0–2.5)
ALT: 36 U/L — ABNORMAL HIGH (ref 6–29)
AST: 21 U/L (ref 10–35)
Albumin: 4.3 g/dL (ref 3.6–5.1)
Alkaline phosphatase (APISO): 90 U/L (ref 37–153)
BUN: 9 mg/dL (ref 7–25)
CO2: 29 mmol/L (ref 20–32)
Calcium: 9.5 mg/dL (ref 8.6–10.4)
Chloride: 104 mmol/L (ref 98–110)
Creat: 0.75 mg/dL (ref 0.50–1.05)
GFR, Est African American: 105 mL/min/{1.73_m2} (ref >=60)
GFR, Est Non African American: 90 mL/min/{1.73_m2} (ref >=60)
Globulin: 3.1 g/dL (calc) (ref 1.9–3.7)
Glucose, Bld: 92 mg/dL (ref 65–99)
Potassium: 4.1 mmol/L (ref 3.5–5.3)
Sodium: 140 mmol/L (ref 135–146)
Total Bilirubin: 0.6 mg/dL (ref 0.2–1.2)
Total Protein: 7.4 g/dL (ref 6.1–8.1)

## 2019-12-21 LAB — SEDIMENTATION RATE: Sed Rate: 14 mm/h (ref 0–30)

## 2019-12-21 LAB — VITAMIN D 25 HYDROXY (VIT D DEFICIENCY, FRACTURES): Vit D, 25-Hydroxy: 30 ng/mL (ref 30–100)

## 2019-12-21 NOTE — Progress Notes (Signed)
Her vitamin D has come back to normal after taking the course of vitamin D.  I would recommend staying on vitamin D 2000 units every day.  Patient should repeat the level in 6 months.

## 2019-12-21 NOTE — Progress Notes (Signed)
Please notify patient that her sed rate was normal.

## 2019-12-22 ENCOUNTER — Telehealth: Payer: Self-pay | Admitting: *Deleted

## 2019-12-22 DIAGNOSIS — Z8639 Personal history of other endocrine, nutritional and metabolic disease: Secondary | ICD-10-CM

## 2019-12-22 NOTE — Telephone Encounter (Signed)
-----   Message from Pollyann Savoy, MD sent at 12/21/2019  4:50 PM EST ----- Her vitamin D has come back to normal after taking the course of vitamin D.  I would recommend staying on vitamin D 2000 units every day.  Patient should repeat the level in 6 months.

## 2019-12-29 DIAGNOSIS — Z23 Encounter for immunization: Secondary | ICD-10-CM | POA: Diagnosis not present

## 2020-01-15 ENCOUNTER — Telehealth: Payer: Self-pay | Admitting: Rheumatology

## 2020-01-15 NOTE — Telephone Encounter (Signed)
Advised patient that Dr. Corliss Skains does recommend the COVID-19 vaccine and it is okay to receive the vaccine since it is not live. Therefore, she does not need to hold her medication. Patient verbalized understanding.

## 2020-01-15 NOTE — Telephone Encounter (Signed)
Patient called stating she is scheduled to have her COVID vaccine next week and wants to make sure it is okay for her to take her Cosentyx injection.

## 2020-01-24 DIAGNOSIS — Z23 Encounter for immunization: Secondary | ICD-10-CM | POA: Diagnosis not present

## 2020-02-22 DIAGNOSIS — L650 Telogen effluvium: Secondary | ICD-10-CM | POA: Diagnosis not present

## 2020-02-22 DIAGNOSIS — L649 Androgenic alopecia, unspecified: Secondary | ICD-10-CM | POA: Diagnosis not present

## 2020-02-22 DIAGNOSIS — L853 Xerosis cutis: Secondary | ICD-10-CM | POA: Diagnosis not present

## 2020-02-23 ENCOUNTER — Telehealth: Payer: Self-pay

## 2020-02-23 ENCOUNTER — Encounter: Payer: Self-pay | Admitting: Rheumatology

## 2020-02-23 ENCOUNTER — Ambulatory Visit: Payer: Self-pay

## 2020-02-23 ENCOUNTER — Ambulatory Visit: Payer: BC Managed Care – PPO | Admitting: Rheumatology

## 2020-02-23 ENCOUNTER — Other Ambulatory Visit: Payer: Self-pay

## 2020-02-23 VITALS — BP 115/76 | HR 63 | Resp 14 | Ht 62.0 in | Wt 155.6 lb

## 2020-02-23 DIAGNOSIS — M533 Sacrococcygeal disorders, not elsewhere classified: Secondary | ICD-10-CM | POA: Diagnosis not present

## 2020-02-23 DIAGNOSIS — L409 Psoriasis, unspecified: Secondary | ICD-10-CM

## 2020-02-23 DIAGNOSIS — M545 Low back pain, unspecified: Secondary | ICD-10-CM

## 2020-02-23 DIAGNOSIS — M791 Myalgia, unspecified site: Secondary | ICD-10-CM

## 2020-02-23 DIAGNOSIS — R5383 Other fatigue: Secondary | ICD-10-CM

## 2020-02-23 DIAGNOSIS — M5030 Other cervical disc degeneration, unspecified cervical region: Secondary | ICD-10-CM

## 2020-02-23 DIAGNOSIS — Z8639 Personal history of other endocrine, nutritional and metabolic disease: Secondary | ICD-10-CM

## 2020-02-23 DIAGNOSIS — M19071 Primary osteoarthritis, right ankle and foot: Secondary | ICD-10-CM

## 2020-02-23 DIAGNOSIS — Z79899 Other long term (current) drug therapy: Secondary | ICD-10-CM

## 2020-02-23 DIAGNOSIS — E559 Vitamin D deficiency, unspecified: Secondary | ICD-10-CM

## 2020-02-23 DIAGNOSIS — L4050 Arthropathic psoriasis, unspecified: Secondary | ICD-10-CM

## 2020-02-23 DIAGNOSIS — M170 Bilateral primary osteoarthritis of knee: Secondary | ICD-10-CM

## 2020-02-23 DIAGNOSIS — M47816 Spondylosis without myelopathy or radiculopathy, lumbar region: Secondary | ICD-10-CM

## 2020-02-23 DIAGNOSIS — M19072 Primary osteoarthritis, left ankle and foot: Secondary | ICD-10-CM

## 2020-02-23 MED ORDER — LIDOCAINE HCL 1 % IJ SOLN
1.5000 mL | INTRAMUSCULAR | Status: AC | PRN
  Administered 2020-02-23: 1.5 mL

## 2020-02-23 MED ORDER — TRIAMCINOLONE ACETONIDE 40 MG/ML IJ SUSP
40.0000 mg | INTRAMUSCULAR | Status: AC | PRN
  Administered 2020-02-23: 40 mg via INTRA_ARTICULAR

## 2020-02-23 NOTE — Progress Notes (Signed)
Office Visit Note  [name]             Date of Birth: 06-Dec-1965           MRN: [id number]             PCP: Rodrigo Ran, MD Referring: Rodrigo Ran, MD Visit Date: 02/23/2020 Occupation: @GUAROCC @  Subjective:  Lower back pain.   History of Present Illness: SARAN LAVIOLETTE is a 54 y.o. female with history of psoriatic arthritis and osteoarthritis.  She states that she has been doing regular exercises and recently did a pilate class.  Since then she has been having increased pain and discomfort in her lower back.  She states that the pain is in her lower back and also in bilateral SI joints.  None of the other joints are painful.  Activities of Daily Living:  Patient reports morning stiffness for 15-20 minutes.   Patient Reports nocturnal pain.  Difficulty dressing/grooming: Reports Difficulty climbing stairs: Reports Difficulty getting out of chair: Reports Difficulty using hands for taps, buttons, cutlery, and/or writing: Denies  Review of Systems  Constitutional: Positive for fatigue.  HENT: Negative for mouth sores, mouth dryness and nose dryness.   Eyes: Negative for itching and dryness.  Respiratory: Negative for shortness of breath and difficulty breathing.   Cardiovascular: Negative for chest pain and palpitations.  Gastrointestinal: Negative for blood in stool, constipation and diarrhea.  Endocrine: Negative for increased urination.  Genitourinary: Negative for difficulty urinating.  Musculoskeletal: Positive for arthralgias, joint pain, morning stiffness and muscle tenderness. Negative for joint swelling, myalgias and myalgias.  Skin: Negative for rash, hair loss and redness.  Allergic/Immunologic: Negative for susceptible to infections.  Neurological: Negative for dizziness, numbness, headaches, memory loss and weakness.  Hematological: Negative for bruising/bleeding tendency.  Psychiatric/Behavioral: Positive for sleep disturbance. Negative for  confusion.    PMFS History:  Patient Active Problem List   Diagnosis Date Noted  . Nonallopathic lesion of lumbosacral region 11/04/2018  . Nonallopathic lesion of sacral region 11/04/2018  . Nonallopathic lesion of thoracic region 11/04/2018  . Psoriasis 07/05/2017  . DJD (degenerative joint disease), cervical 02/03/2017  . Spondylosis of lumbar region without myelopathy or radiculopathy 02/03/2017  . High risk medication use 10/06/2016  . Plantar fasciitis 10/06/2016  . Achilles tendinitis of both lower extremities 10/06/2016  . Vitamin D deficiency 10/06/2016  . Primary osteoarthritis of both knees 10/06/2016  . Primary osteoarthritis of both feet 10/06/2016  . Chronic low back pain without sciatica 10/06/2016  . Hypothyroidism 10/06/2016  . Polyp of colon 10/06/2016  . B12 deficiency 10/06/2016  . 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 Anal fissure 04/11/2012    Past Medical History:  Diagnosis Date  . Anal fissure 2004   Tx'd with diltiazem  gel by Dr. 2005  . Arthritis   . Bronchitis, acute 2008  . Seasonal allergies   . Thyroid disease   . URI 10/12/2007   Qualifier: Diagnosis of  By: 13/10/2007 MD, Alwyn Ren History  Problem Relation Age of Onset  . Hypertension Mother   . Diabetes Mother    Past Surgical History:  Procedure Laterality Date  . BREAST SURGERY  12/15/2017   breast reduction    Social History   Social History Narrative  . Not on file   Immunization History  Administered Date(s) Administered  . Pneumococcal Polysaccharide-23 10/28/2010  . Zoster Recombinat (Shingrix) 11/16/2018, 01/30/2019     Objective: Vital  Signs: BP 115/76 (BP Location: Left Arm, Patient Position: Sitting, Cuff Size: Normal)   Pulse 63   Resp 14   Ht 5\' 2"  (1.575 m)   Wt 155 lb 9.6 oz (70.6 kg)   BMI 28.46 kg/m    Physical Exam Vitals and nursing note reviewed.  Constitutional:      Appearance: She is well-developed.  HENT:     Head:  Normocephalic and atraumatic.  Eyes:     Conjunctiva/sclera: Conjunctivae normal.  Cardiovascular:     Rate and Rhythm: Normal rate and regular rhythm.     Heart sounds: Normal heart sounds.  Pulmonary:     Effort: Pulmonary effort is normal.     Breath sounds: Normal breath sounds.  Abdominal:     General: Bowel sounds are normal.     Palpations: Abdomen is soft.  Musculoskeletal:     Cervical back: Normal range of motion.  Lymphadenopathy:     Cervical: No cervical adenopathy.  Skin:    General: Skin is warm and dry.     Capillary Refill: Capillary refill takes less than 2 seconds.  Neurological:     Mental Status: She is alert and oriented to person, place, and time.  Psychiatric:        Behavior: Behavior normal.      Musculoskeletal Exam: C-spine was in good range of motion.  She had discomfort range of motion of the lumbar spine.  She has tenderness on palpation of bilateral SI joints.  Shoulder joints, elbow joints, wrist joints, MCPs, PIPs and DIPs with good range of motion with no synovitis.  Hip joints, knee joints, ankles, MTPs and PIPs with good range of motion with no synovitis.  There was no evidence of plantar fasciitis or Achilles tendinitis.  CDAI Exam: CDAI Score: - Patient Global: -; Provider Global: - Swollen: -; Tender: - Joint Exam 02/23/2020   No joint exam has been documented for this visit   There is currently no information documented on the homunculus. Go to the Rheumatology activity and complete the homunculus joint exam.  Investigation: No additional findings.  Imaging: XR Lumbar Spine 2-3 Views  Result Date: 02/23/2020 No disc space narrowing was noted.  Mild facet joint arthropathy was noted.  No syndesmophytes were noted. Impression: Facet joint arthropathy of the lumbar spine.  XR Pelvis 1-2 Views  Result Date: 02/23/2020 Osteoarthritic changes were noted in the SI joints with inferior spurring.  Mild sclerosis was noted.  Bilateral hip  joints were within normal limits. Impression: Mild sclerosis of the SI joints with osteoarthritic changes was noted.   Recent Labs: Lab Results  Component Value Date   WBC 6.2 12/19/2019   HGB 13.5 12/19/2019   PLT 200 12/19/2019   NA 140 12/19/2019   K 4.1 12/19/2019   CL 104 12/19/2019   CO2 29 12/19/2019   GLUCOSE 92 12/19/2019   BUN 9 12/19/2019   CREATININE 0.75 12/19/2019   BILITOT 0.6 12/19/2019   ALKPHOS 77 05/27/2017   AST 21 12/19/2019   ALT 36 (H) 12/19/2019   PROT 7.4 12/19/2019   ALBUMIN 3.9 05/27/2017   CALCIUM 9.5 12/19/2019   GFRAA 105 12/19/2019   QFTBGOLDPLUS NEGATIVE 12/19/2019    Speciality Comments: Prior therapy:  failed Humira, Enbrel, Otezla, intolerance to oral methotrexate and Arava  Procedures:  Sacroiliac Joint Inj on 02/23/2020 1:26 PM Indications: pain Details: 27 G 1.5 in needle, posterior approach Medications (Right): 1.5 mL lidocaine 1 %; 40 mg triamcinolone acetonide 40 MG/ML  Medications (Left): 1.5 mL lidocaine 1 %; 40 mg triamcinolone acetonide 40 MG/ML Outcome: tolerated well, no immediate complications Procedure, treatment alternatives, risks and benefits explained, specific risks discussed. Consent was given by the patient. Immediately prior to procedure a time out was called to verify the correct patient, procedure, equipment, support staff and site/side marked as required. Patient was prepped and draped in the usual sterile fashion.     Allergies: Pollen extract, Arava [leflunomide], and Methotrexate derivatives   Assessment / Plan:     Visit Diagnoses: Psoriatic arthritis (HCC)-patient has no active synovitis on examination today.  She has tenderness on palpation of bilateral SI joints.  Psoriasis-she has no psoriasis lesions.  Sacroiliac pain -she has been experiencing increased pain and discomfort in bilateral SI joints.  The symptoms triggered after doing some exercise class.  Plan: XR Pelvis 1-2 Views.  X-ray showed mild  sclerosis and some osteoarthritic changes.  She was having severe pain and discomfort.  She had nocturnal pain and difficulty walking.  After different treatment options were discussed we decided to inject bilateral SI joints with cortisone as described above.  She tolerated the procedure well.  Have advised her to contact me in case her symptoms do not improve.  Acute midline low back pain without sciatica -she has been also having lower back discomfort.  Plan: XR Lumbar Spine 2-3 Views.  The x-ray showed facet joint arthropathy.  No significant disc space narrowing was noted.  Mild scoliosis was noted.  High risk medication use - Cosentyx 300 mg every 28 days,(she took only 1 dose of Otrexup and never restarted it.),  And folic acid 2 mg daily.   Primary osteoarthritis of both knees-she is doing quite well without any warmth swelling or effusion.  Primary osteoarthritis of both feet-currently not having much discomfort.  DDD (degenerative disc disease), cervical - with moderate spinal stenosis  Arthropathy of lumbar facet joint-the x-rays obtained today showed arthropathy of lumbar facet joint.  Other medical problems are listed as follows:  Myalgia  Vitamin D deficiency  Other fatigue  History of hypothyroidism  Orders: Orders Placed This Encounter  Procedures  . XR Pelvis 1-2 Views  . XR Lumbar Spine 2-3 Views   No orders of the defined types were placed in this encounter.   .  Follow-Up Instructions: Return for Psoriatic arthritis.   Bo Merino, MD  Note - This record has been created using Editor, commissioning.  Chart creation errors have been sought, but may not always  have been located. Such creation errors do not reflect on  the standard of medical care.

## 2020-02-23 NOTE — Telephone Encounter (Signed)
Patient complains of back pain. patient states she has been working out and attended a piliates class. After the class, she has been having pain near the SI joints, but no injury she is aware of. It is painful to sit down, bend or lie down. Patient states she does not have pain with standing. She denies any numbness or radiating pain. Sudden movement causes pain, she has been using heat and ice. Patient states she has taken tizanidine 4mg  and that has not helped.   I discussed symptoms with Dr. and we can send in robaxin 500mg  BID and she can take tizanidine 4mg  at bedtime. Dr. Corliss Skains also offered an injection if patient's blood pressure is normal. Patient would prefer the injection. She will come in today at 12:15pm.

## 2020-02-25 ENCOUNTER — Telehealth: Payer: Self-pay | Admitting: Rheumatology

## 2020-02-25 ENCOUNTER — Other Ambulatory Visit: Payer: Self-pay | Admitting: Rheumatology

## 2020-02-25 MED ORDER — METHOCARBAMOL 500 MG PO TABS: 500.0000 mg | ORAL_TABLET | Freq: Two times a day (BID) | ORAL | 0 refills | Status: AC

## 2020-02-25 MED ORDER — TIZANIDINE HCL 4 MG PO TABS: 4.0000 mg | ORAL_TABLET | Freq: Every day | ORAL | 2 refills | Status: DC

## 2020-02-25 NOTE — Telephone Encounter (Signed)
Patient called and stated that her back pain has improved with the injections but she is still having a lot of muscle spasms.  I discussed the side effects of muscle relaxers and discussed that she should not drive while taking these meds. A rx for Robaxin 500 mg po  q AM and at 2 PM was sent to [facility] and refill on Tizanidine was sent.  Pollyann Savoy, MD

## 2020-03-08 NOTE — Progress Notes (Signed)
Office Visit Note  [name]             Date of Birth: Jun 28, 1966           MRN: [id number]             PCP: Rodrigo Ran, MD Referring: Rodrigo Ran, MD Visit Date: 03/18/2020 Occupation: @GUAROCC @  Subjective:  Medication monitoring.   History of Present Illness: Wendy [name] is a 54 y.o. female with history of psoriatic arthritis, psoriasis and osteoarthritis.  She states she is doing really well after the SI joint injection.  She has been working out on a regular basis and has not been having much discomfort.  She states she experiences some muscle tightness in her lower back which is tolerable.  She denies any joint swelling.  She has not had any psoriasis rash.  She denies any recent episodes of Achilles tendinitis or plantar fasciitis.  Activities of Daily Living:  Patient reports morning stiffness for 15-20 minutes.   Patient Reports nocturnal pain.  Difficulty dressing/grooming: Denies Difficulty climbing stairs: Reports Difficulty getting out of chair: Reports Difficulty using hands for taps, buttons, cutlery, and/or writing: Reports  Review of Systems  Constitutional: Positive for fatigue. Negative for night sweats, weight gain and weight loss.  HENT: Negative for mouth sores, trouble swallowing, trouble swallowing, mouth dryness and nose dryness.   Eyes: Positive for dryness. Negative for pain, redness, itching and visual disturbance.  Respiratory: Negative for cough, shortness of breath and difficulty breathing.   Cardiovascular: Negative for chest pain, palpitations, hypertension, irregular heartbeat and swelling in legs/feet.  Gastrointestinal: Negative for blood in stool, constipation and diarrhea.  Endocrine: Negative for increased urination.  Genitourinary: Negative for difficulty urinating, painful urination and vaginal dryness.  Musculoskeletal: Positive for arthralgias, joint pain, myalgias, morning stiffness and myalgias. Negative for joint  swelling, muscle weakness and muscle tenderness.  Skin: Negative for color change, rash, hair loss, redness, skin tightness, ulcers and sensitivity to sunlight.  Allergic/Immunologic: Negative for susceptible to infections.  Neurological: Negative for dizziness, numbness, headaches, memory loss, night sweats and weakness.  Hematological: Negative for bruising/bleeding tendency and swollen glands.  Psychiatric/Behavioral: Negative for depressed mood, confusion and sleep disturbance. The patient is not nervous/anxious.     PMFS History:  Patient Active Problem List   Diagnosis Date Noted  . Nonallopathic lesion of lumbosacral region 11/04/2018  . Nonallopathic lesion of sacral region 11/04/2018  . Nonallopathic lesion of thoracic region 11/04/2018  . Psoriasis 07/05/2017  . DJD (degenerative joint disease), cervical 02/03/2017  . Spondylosis of lumbar region without myelopathy or radiculopathy 02/03/2017  . High risk medication use 10/06/2016  . Plantar fasciitis 10/06/2016  . Achilles tendinitis of both lower extremities 10/06/2016  . Vitamin D deficiency 10/06/2016  . Primary osteoarthritis of both knees 10/06/2016  . Primary osteoarthritis of both feet 10/06/2016  . Chronic low back pain without sciatica 10/06/2016  . Hypothyroidism 10/06/2016  . Polyp of colon 10/06/2016  . B12 deficiency 10/06/2016  . 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 Anal fissure 04/11/2012    Past Medical History:  Diagnosis Date  . Anal fissure 2004   Tx'd with diltiazem  gel by Dr. 2005  . Arthritis   . Bronchitis, acute 2008  . Seasonal allergies   . Thyroid disease   . URI 10/12/2007   Qualifier: Diagnosis of  By: 13/10/2007 MD, Alwyn Ren History  Problem Relation Age of Onset  .  Hypertension Mother   . Diabetes Mother    Past Surgical History:  Procedure Laterality Date  . BREAST SURGERY  12/15/2017   breast reduction    Social History   Social History Narrative  .  Not on file   Immunization History  Administered Date(s) Administered  . Pneumococcal Polysaccharide-23 10/28/2010  . Zoster Recombinat (Shingrix) 11/16/2018, 01/30/2019     Objective: Vital Signs: BP 130/85 (BP Location: Left Arm, Patient Position: Sitting, Cuff Size: Normal)   Pulse 61   Resp 13   Ht 5\' 2"  (1.575 m)   Wt 155 lb 3.2 oz (70.4 kg)   BMI 28.39 kg/m    Physical Exam Vitals and nursing note reviewed.  Constitutional:      Appearance: She is well-developed.  HENT:     Head: Normocephalic and atraumatic.  Eyes:     Conjunctiva/sclera: Conjunctivae normal.  Cardiovascular:     Rate and Rhythm: Normal rate and regular rhythm.     Heart sounds: Normal heart sounds.  Pulmonary:     Effort: Pulmonary effort is normal.     Breath sounds: Normal breath sounds.  Abdominal:     General: Bowel sounds are normal.     Palpations: Abdomen is soft.  Musculoskeletal:     Cervical back: Normal range of motion.  Lymphadenopathy:     Cervical: No cervical adenopathy.  Skin:    General: Skin is warm and dry.     Capillary Refill: Capillary refill takes less than 2 seconds.  Neurological:     Mental Status: She is alert and oriented to person, place, and time.  Psychiatric:        Behavior: Behavior normal.      Musculoskeletal Exam: C-spine thoracic and lumbar spine with good range of motion.  Shoulder joints, elbow joints, wrist joints, MCPs PIPs and DIPs with good range of motion with no synovitis.  Hip joints, knee joints, ankles, MTPs and PIPs with good range of motion with no synovitis.  CDAI Exam: CDAI Score: -- Patient Global: --; Provider Global: -- Swollen: --; Tender: -- Joint Exam 03/18/2020   No joint exam has been documented for this visit   There is currently no information documented on the homunculus. Go to the Rheumatology activity and complete the homunculus joint exam.  Investigation: No additional findings.  Imaging: XR Lumbar Spine 2-3  Views  Result Date: 02/23/2020 No disc space narrowing was noted.  Mild facet joint arthropathy was noted.  No syndesmophytes were noted. Impression: Facet joint arthropathy of the lumbar spine.  XR Pelvis 1-2 Views  Result Date: 02/23/2020 Osteoarthritic changes were noted in the SI joints with inferior spurring.  Mild sclerosis was noted.  Bilateral hip joints were within normal limits. Impression: Mild sclerosis of the SI joints with osteoarthritic changes was noted.   Recent Labs: Lab Results  Component Value Date   WBC 6.2 12/19/2019   HGB 13.5 12/19/2019   PLT 200 12/19/2019   NA 140 12/19/2019   K 4.1 12/19/2019   CL 104 12/19/2019   CO2 29 12/19/2019   GLUCOSE 92 12/19/2019   BUN 9 12/19/2019   CREATININE 0.75 12/19/2019   BILITOT 0.6 12/19/2019   ALKPHOS 77 05/27/2017   AST 21 12/19/2019   ALT 36 (H) 12/19/2019   PROT 7.4 12/19/2019   ALBUMIN 3.9 05/27/2017   CALCIUM 9.5 12/19/2019   GFRAA 105 12/19/2019   QFTBGOLDPLUS NEGATIVE 12/19/2019    Speciality Comments: Prior therapy:  failed Humira, 12/21/2019,  intolerance to oral methotrexate and Arava  Procedures:  No procedures performed Allergies: Pollen extract, Arava [leflunomide], and Methotrexate derivatives   Assessment / Plan:     Visit Diagnoses: Psoriatic arthritis (HCC)-she has no synovitis on my examination today.  Psoriasis-no psoriasis rash was noted.  Sacroiliac pain-improved after the cortisone injection.  She is also been exercising on a regular basis.  High risk medication use -  Cosentyx 300 mg every 28 days, Otrexup 20 mg every 7 days (she took only 1 dose and never restarted it.),   - Plan: CBC with Differential/Platelet, COMPLETE METABOLIC PANEL WITH GFR today and then every 3 months to monitor for drug toxicity.  Primary osteoarthritis of both knees-she is currently not having much discomfort.  Primary osteoarthritis of both feet  DDD (degenerative disc disease), cervical-doing  well.  DDD (degenerative disc disease), lumbar-she has some muscle spasms but no discomfort.  Arthropathy of lumbar facet joint  Myalgia  Vitamin D deficiency  Other fatigue  History of hypothyroidism  Orders: Orders Placed This Encounter  Procedures  . CBC with Differential/Platelet  . COMPLETE METABOLIC PANEL WITH GFR   No orders of the defined types were placed in this encounter.   Follow-Up Instructions: Return in about 5 months (around 08/18/2020) for Psoriatic arthritis.   Bo Merino, MD  Note - This record has been created using Editor, commissioning.  Chart creation errors have been sought, but may not always  have been located. Such creation errors do not reflect on  the standard of medical care.

## 2020-03-18 ENCOUNTER — Other Ambulatory Visit: Payer: Self-pay

## 2020-03-18 ENCOUNTER — Ambulatory Visit: Payer: BC Managed Care – PPO | Admitting: Rheumatology

## 2020-03-18 ENCOUNTER — Encounter: Payer: Self-pay | Admitting: Rheumatology

## 2020-03-18 VITALS — BP 130/85 | HR 61 | Resp 13 | Ht 62.0 in | Wt 155.2 lb

## 2020-03-18 DIAGNOSIS — Z79899 Other long term (current) drug therapy: Secondary | ICD-10-CM | POA: Diagnosis not present

## 2020-03-18 DIAGNOSIS — M19071 Primary osteoarthritis, right ankle and foot: Secondary | ICD-10-CM

## 2020-03-18 DIAGNOSIS — M533 Sacrococcygeal disorders, not elsewhere classified: Secondary | ICD-10-CM | POA: Diagnosis not present

## 2020-03-18 DIAGNOSIS — L409 Psoriasis, unspecified: Secondary | ICD-10-CM | POA: Diagnosis not present

## 2020-03-18 DIAGNOSIS — M5030 Other cervical disc degeneration, unspecified cervical region: Secondary | ICD-10-CM

## 2020-03-18 DIAGNOSIS — M170 Bilateral primary osteoarthritis of knee: Secondary | ICD-10-CM

## 2020-03-18 DIAGNOSIS — Z8639 Personal history of other endocrine, nutritional and metabolic disease: Secondary | ICD-10-CM

## 2020-03-18 DIAGNOSIS — R5383 Other fatigue: Secondary | ICD-10-CM

## 2020-03-18 DIAGNOSIS — M791 Myalgia, unspecified site: Secondary | ICD-10-CM

## 2020-03-18 DIAGNOSIS — M5136 Other intervertebral disc degeneration, lumbar region: Secondary | ICD-10-CM

## 2020-03-18 DIAGNOSIS — M19072 Primary osteoarthritis, left ankle and foot: Secondary | ICD-10-CM

## 2020-03-18 DIAGNOSIS — L4050 Arthropathic psoriasis, unspecified: Secondary | ICD-10-CM

## 2020-03-18 DIAGNOSIS — E559 Vitamin D deficiency, unspecified: Secondary | ICD-10-CM

## 2020-03-18 DIAGNOSIS — M47816 Spondylosis without myelopathy or radiculopathy, lumbar region: Secondary | ICD-10-CM

## 2020-03-18 LAB — COMPLETE METABOLIC PANEL WITH GFR
AG Ratio: 1.4 (calc) (ref 1.0–2.5)
ALT: 24 U/L (ref 6–29)
AST: 17 U/L (ref 10–35)
Albumin: 4.2 g/dL (ref 3.6–5.1)
Alkaline phosphatase (APISO): 81 U/L (ref 37–153)
BUN: 9 mg/dL (ref 7–25)
CO2: 29 mmol/L (ref 20–32)
Calcium: 9.6 mg/dL (ref 8.6–10.4)
Chloride: 104 mmol/L (ref 98–110)
Creat: 0.81 mg/dL (ref 0.50–1.05)
GFR, Est African American: 95 mL/min/{1.73_m2} (ref >=60)
GFR, Est Non African American: 82 mL/min/{1.73_m2} (ref >=60)
Globulin: 3.1 g/dL (calc) (ref 1.9–3.7)
Glucose, Bld: 81 mg/dL (ref 65–99)
Potassium: 4.5 mmol/L (ref 3.5–5.3)
Sodium: 140 mmol/L (ref 135–146)
Total Bilirubin: 0.7 mg/dL (ref 0.2–1.2)
Total Protein: 7.3 g/dL (ref 6.1–8.1)

## 2020-03-18 LAB — CBC WITH DIFFERENTIAL/PLATELET
Absolute Monocytes: 493 cells/uL (ref 200–950)
Basophils Absolute: 51 cells/uL (ref 0–200)
Basophils Relative: 0.8 %
Eosinophils Absolute: 173 cells/uL (ref 15–500)
Eosinophils Relative: 2.7 %
HCT: 43.4 % (ref 35.0–45.0)
Hemoglobin: 13.6 g/dL (ref 11.7–15.5)
Lymphs Abs: 1958 cells/uL (ref 850–3900)
MCH: 27.5 pg (ref 27.0–33.0)
MCHC: 31.3 g/dL — ABNORMAL LOW (ref 32.0–36.0)
MCV: 87.9 fL (ref 80.0–100.0)
MPV: 11.7 fL (ref 7.5–12.5)
Monocytes Relative: 7.7 %
Neutro Abs: 3725 cells/uL (ref 1500–7800)
Neutrophils Relative %: 58.2 %
Platelets: 188 10*3/uL (ref 140–400)
RBC: 4.94 10*6/uL (ref 3.80–5.10)
RDW: 13.3 % (ref 11.0–15.0)
Total Lymphocyte: 30.6 %
WBC: 6.4 10*3/uL (ref 3.8–10.8)

## 2020-03-18 NOTE — Patient Instructions (Signed)
Standing Labs We placed an order today for your standing lab work.    Please come back and get your standing labs in July and every 3 months  We have open lab daily Monday through Thursday from 8:30-12:30 PM and 1:30-4:30 PM and Friday from 8:30-12:30 PM and 1:30-4:00 PM at the office of Dr. Dartanyon Frankowski.   You may experience shorter wait times on Monday and Friday afternoons. The office is located at [address], [address], [place], [place] [address] No appointment is necessary.   Labs are drawn by [facility].  You may receive a bill from [facility] for your lab work.  If you wish to have your labs drawn at another location, please call the office 24 hours in advance to send orders.  If you have any questions regarding directions or hours of operation,  please call [phone].   Just as a reminder please drink plenty of water prior to coming for your lab work. Thanks!  

## 2020-03-21 NOTE — Progress Notes (Signed)
[name] [address] [place] [address] Phone: [phone] Subjective:   [name], am serving as a scribe for Dr. Antoine Primas. This visit occurred during the SARS-CoV-2 public health emergency.  Safety protocols were in place, including screening questions prior to the visit, additional usage of staff PPE, and extensive cleaning of exam room while observing appropriate contact time as indicated for disinfecting solutions.   I'm seeing this patient by the request  of:  Rodrigo Ran, MD  CC: Back pain follow-up  [id number]  Wendy [name] is a 54 y.o. female coming in with complaint of back pain. Last seen on 06/27/2019 for OMT. Patient states that her left scapula is tight.  Patient has had more of a dull, throbbing aching sensation overall.  Patient states that more tightness.  Trying to be more active again but having difficulty.  Patient is concerned somewhat that the thyroid may be playing a role.  Has not had it checked in a while.  Continues the same medications.      Past Medical History:  Diagnosis Date  . Anal fissure 2004   Tx'd with diltiazem  gel by Dr. Kendrick Ranch  . Arthritis   . Bronchitis, acute 2008  . Seasonal allergies   . Thyroid disease   . URI 10/12/2007   Qualifier: Diagnosis of  By: Alwyn Ren MD, Chrissie Noa     Past Surgical History:  Procedure Laterality Date  . BREAST SURGERY  12/15/2017   breast reduction    Social History   Socioeconomic History  . Marital status: Married    Spouse name: Not on file  . Number of children: Not on file  . Years of education: Not on file  . Highest education level: Not on file  Occupational History  . Not on file  Tobacco Use  . Smoking status: Never Smoker  . Smokeless tobacco: Never Used  Substance and Sexual Activity  . Alcohol use: Yes    Comment: occ  . Drug use: No  . Sexual activity: Not on file  Other Topics Concern  . Not on file  Social History  Narrative  . Not on file   Social Determinants of Health   Financial Resource Strain:   . Difficulty of Paying Living Expenses:   Food Insecurity:   . Worried About Programme researcher, broadcasting/film/video in the Last Year:   . Barista in the Last Year:   Transportation Needs:   . Freight forwarder (Medical):   Marland Kitchen Lack of Transportation (Non-Medical):   Physical Activity:   . Days of Exercise per Week:   . Minutes of Exercise per Session:   Stress:   . Feeling of Stress :   Social Connections:   . Frequency of Communication with Friends and Family:   . Frequency of Social Gatherings with Friends and Family:   . Attends Religious Services:   . Active Member of Clubs or Organizations:   . Attends Banker Meetings:   Marland Kitchen Marital Status:    Allergies  Allergen Reactions  . Pollen Extract   . Arava [Leflunomide] Other (See Comments)    Hair loss  . Methotrexate Derivatives Nausea Only   Family History  Problem Relation Age of Onset  . Hypertension Mother   . Diabetes Mother     Current Outpatient Medications (Endocrine & Metabolic):  .  levothyroxine (SYNTHROID, LEVOTHROID) 75 MCG tablet,  .  thyroid (ARMOUR) 30 MG tablet,  Take 1 tablet (30 mg total) by mouth daily.   Current Outpatient Medications (Respiratory):  .  levocetirizine (XYZAL) 5 MG tablet, Take 5 mg by mouth daily.   Current Outpatient Medications (Hematological):  Marland Kitchen  Cyanocobalamin (VITAMIN B 12 PO), Take by mouth. .  folic acid (FOLVITE) 1 MG tablet, Take 2 tablets (2 mg total) by mouth daily.  Current Outpatient Medications (Other):  Marland Kitchen  Calcium Carbonate-Vit D-Min (CALCIUM 1200 PO), Take by mouth. .  COSENTYX SENSOREADY, 300 MG, 150 MG/ML SOAJ, INJECT TWO PENS SUBCUTANEOUSLY EVERY 4 WEEKS. REFRIGERATE. ALLOW 15 TO 30 MINUTES AT ROOM TEMP PRIOR TO ADMINISTRATION. (Patient taking differently: 150 mg every 14 (fourteen) days. ) .  methocarbamol (ROBAXIN) 500 MG tablet, Take 1 tablet (500 mg total) by  mouth 2 (two) times daily. Take 1 tablet q AM and 1 tablet at 2 PM (Patient taking differently: Take 500 mg by mouth as needed. Take 1 tablet q AM and 1 tablet at 2 PM) .  Omega-3 Fatty Acids (FISH OIL) 435 MG CAPS, Take by mouth. Marland Kitchen  tiZANidine (ZANAFLEX) 4 MG tablet, Take 1 tablet (4 mg total) by mouth at bedtime. (Patient taking differently: Take 4 mg by mouth as needed. ) .  VITAMIN D PO, Take 1,000 Units by mouth daily.    Reviewed prior external information including notes and imaging from  primary care provider As well as notes that were available from care everywhere and other healthcare systems.  Past medical history, social, surgical and family history all reviewed in electronic medical record.  No pertanent information unless stated regarding to the chief complaint.   Review of Systems:  No headache, visual changes, nausea, vomiting, diarrhea, constipation, dizziness, abdominal pain, skin rash, fevers, chills, night sweats, weight loss, swollen lymph nodes, body aches, joint swelling, chest pain, shortness of breath, mood changes. POSITIVE muscle aches  Objective  Blood pressure 108/70, pulse (!) 57, height 5\' 2"  (1.575 m), weight 152 lb (68.9 kg), SpO2 99 %.   General: No apparent distress alert and oriented x3 mood and affect normal, dressed appropriately.  HEENT: Pupils equal, extraocular movements intact  Respiratory: Patient's speak in full sentences and does not appear short of breath  Cardiovascular: No lower extremity edema, non tender, no erythema  Neuro: Cranial nerves II through XII are intact, neurovascularly intact in all extremities with 2+ DTRs and 2+ pulses.  Gait antalgic MSK: Mild arthritic changes of multiple joints Lower back does have some tenderness to palpation of the paraspinal musculature.  Tightness of the hips bilaterally.  Especially with FABER test.  Mild tightness with straight leg test but no true radicular symptoms.  Worsening pain with extension  greater than 10 degrees of the back.  Osteopathic findings  T7 extended rotated and side bent left L2 flexed rotated and side bent right Sacrum right on right    Impression and Recommendations:     This case required medical decision making of moderate complexity. The above documentation has been reviewed and is accurate and complete Lyndal Pulley, DO       Note: This dictation was prepared with Dragon dictation along with smaller phrase technology. Any transcriptional errors that result from this process are unintentional.

## 2020-03-22 ENCOUNTER — Encounter: Payer: Self-pay | Admitting: Family Medicine

## 2020-03-22 ENCOUNTER — Other Ambulatory Visit: Payer: Self-pay

## 2020-03-22 ENCOUNTER — Ambulatory Visit: Payer: BC Managed Care – PPO | Admitting: Family Medicine

## 2020-03-22 VITALS — BP 108/70 | HR 57 | Ht 62.0 in | Wt 152.0 lb

## 2020-03-22 DIAGNOSIS — M999 Biomechanical lesion, unspecified: Secondary | ICD-10-CM

## 2020-03-22 DIAGNOSIS — M47816 Spondylosis without myelopathy or radiculopathy, lumbar region: Secondary | ICD-10-CM | POA: Diagnosis not present

## 2020-03-22 DIAGNOSIS — M2550 Pain in unspecified joint: Secondary | ICD-10-CM

## 2020-03-22 NOTE — Assessment & Plan Note (Signed)

## 2020-03-22 NOTE — Assessment & Plan Note (Signed)
Patient does have a chronic problem with an exacerbation at this time.  Concerned that she says her hypothyroidism is not as well controlled and we will check laboratory work-up at the moment.  Has been responding well to the Armour previously.  We will see if we are at the right dose.  Encourage patient to continue to stay active otherwise.  Encouraged the home exercises again.  Discussed posture and ergonomics.  Follow-up again in 4 to 8 weeks

## 2020-03-22 NOTE — Patient Instructions (Signed)
Good to see you  We will get thyroid labs today  Work on the upper back and posture See me again in 8 weeks

## 2020-03-23 LAB — THYROID PANEL WITH TSH
Free Thyroxine Index: 3.2 (ref 1.4–3.8)
T3 Uptake: 34 % (ref 22–35)
T4, Total: 9.4 ug/dL (ref 5.1–11.9)
TSH: 0.04 mIU/L — ABNORMAL LOW

## 2020-03-25 ENCOUNTER — Telehealth: Payer: Self-pay | Admitting: Family Medicine

## 2020-03-25 MED ORDER — THYROID 30 MG PO TABS: 30.0000 mg | ORAL_TABLET | Freq: Every day | ORAL | 3 refills | Status: DC

## 2020-03-25 NOTE — Telephone Encounter (Signed)
done

## 2020-03-25 NOTE — Telephone Encounter (Signed)
Patient called regarding the refill request for her thyroid (ARMOUR) 30 MG tablet. She asked if this could be send to the [facility] on [address].   Please advise.

## 2020-03-26 DIAGNOSIS — E559 Vitamin D deficiency, unspecified: Secondary | ICD-10-CM | POA: Diagnosis not present

## 2020-03-26 DIAGNOSIS — E039 Hypothyroidism, unspecified: Secondary | ICD-10-CM | POA: Diagnosis not present

## 2020-03-26 DIAGNOSIS — E038 Other specified hypothyroidism: Secondary | ICD-10-CM | POA: Diagnosis not present

## 2020-03-26 DIAGNOSIS — E538 Deficiency of other specified B group vitamins: Secondary | ICD-10-CM | POA: Diagnosis not present

## 2020-03-26 DIAGNOSIS — Z0000 Encounter for general adult medical examination without abnormal findings: Secondary | ICD-10-CM | POA: Diagnosis not present

## 2020-03-26 DIAGNOSIS — M8589 Other specified disorders of bone density and structure, multiple sites: Secondary | ICD-10-CM | POA: Diagnosis not present

## 2020-04-02 DIAGNOSIS — J0100 Acute maxillary sinusitis, unspecified: Secondary | ICD-10-CM | POA: Diagnosis not present

## 2020-04-02 DIAGNOSIS — Z0000 Encounter for general adult medical examination without abnormal findings: Secondary | ICD-10-CM | POA: Diagnosis not present

## 2020-04-02 DIAGNOSIS — Z23 Encounter for immunization: Secondary | ICD-10-CM | POA: Diagnosis not present

## 2020-04-02 DIAGNOSIS — M859 Disorder of bone density and structure, unspecified: Secondary | ICD-10-CM | POA: Diagnosis not present

## 2020-04-02 DIAGNOSIS — E559 Vitamin D deficiency, unspecified: Secondary | ICD-10-CM | POA: Diagnosis not present

## 2020-04-02 DIAGNOSIS — E538 Deficiency of other specified B group vitamins: Secondary | ICD-10-CM | POA: Diagnosis not present

## 2020-04-03 DIAGNOSIS — J3081 Allergic rhinitis due to animal (cat) (dog) hair and dander: Secondary | ICD-10-CM | POA: Diagnosis not present

## 2020-04-03 DIAGNOSIS — T783XXD Angioneurotic edema, subsequent encounter: Secondary | ICD-10-CM | POA: Diagnosis not present

## 2020-04-03 DIAGNOSIS — J3089 Other allergic rhinitis: Secondary | ICD-10-CM | POA: Diagnosis not present

## 2020-04-03 DIAGNOSIS — J301 Allergic rhinitis due to pollen: Secondary | ICD-10-CM | POA: Diagnosis not present

## 2020-04-05 DIAGNOSIS — Z1212 Encounter for screening for malignant neoplasm of rectum: Secondary | ICD-10-CM | POA: Diagnosis not present

## 2020-05-14 DIAGNOSIS — E538 Deficiency of other specified B group vitamins: Secondary | ICD-10-CM | POA: Diagnosis not present

## 2020-05-16 ENCOUNTER — Ambulatory Visit: Payer: BC Managed Care – PPO | Admitting: Family Medicine

## 2020-05-16 ENCOUNTER — Encounter: Payer: Self-pay | Admitting: Family Medicine

## 2020-05-16 ENCOUNTER — Other Ambulatory Visit: Payer: Self-pay

## 2020-05-16 VITALS — BP 110/80 | HR 65 | Ht 62.0 in | Wt 157.0 lb

## 2020-05-16 DIAGNOSIS — L4050 Arthropathic psoriasis, unspecified: Secondary | ICD-10-CM | POA: Diagnosis not present

## 2020-05-16 DIAGNOSIS — M545 Low back pain: Secondary | ICD-10-CM

## 2020-05-16 DIAGNOSIS — M999 Biomechanical lesion, unspecified: Secondary | ICD-10-CM

## 2020-05-16 DIAGNOSIS — M47816 Spondylosis without myelopathy or radiculopathy, lumbar region: Secondary | ICD-10-CM | POA: Diagnosis not present

## 2020-05-16 DIAGNOSIS — G8929 Other chronic pain: Secondary | ICD-10-CM

## 2020-05-16 NOTE — Progress Notes (Signed)
[name] [address] [place] [address] Phone: [phone] Subjective:   I Ronelle Nigh am serving as a Neurosurgeon for Dr. Antoine Primas.  This visit occurred during the SARS-CoV-2 public health emergency.  Safety protocols were in place, including screening questions prior to the visit, additional usage of staff PPE, and extensive cleaning of exam room while observing appropriate contact time as indicated for disinfecting solutions.   I'm seeing this patient by the request  of:  Rodrigo Ran, MD  CC: Low back pain follow-up  [id number]  Wendy [name] is a 54 y.o. female coming in with complaint of back and neck pain. OMT 03/22/2020. Patient states she is tight in the shoulder area.   Medications patient has been prescribed:           Reviewed prior external information including notes and imaging from previsou exam, outside providers and external EMR if available.   As well as notes that were available from care everywhere and other healthcare systems.  Past medical history, social, surgical and family history all reviewed in electronic medical record.  No pertanent information unless stated regarding to the chief complaint.   Past Medical History:  Diagnosis Date  . Anal fissure 2004   Tx'd with diltiazem  gel by Dr. Kendrick Ranch  . Arthritis   . Bronchitis, acute 2008  . Seasonal allergies   . Thyroid disease   . URI 10/12/2007   Qualifier: Diagnosis of  By: Alwyn Ren MD, Chrissie Noa      Allergies  Allergen Reactions  . Pollen Extract   . Arava [Leflunomide] Other (See Comments)    Hair loss  . Methotrexate Derivatives Nausea Only     Review of Systems:  No headache, visual changes, nausea, vomiting, diarrhea, constipation, dizziness, abdominal pain, skin rash, fevers, chills, night sweats, weight loss, swollen lymph nodes, , joint swelling, chest pain, shortness of breath, mood changes. POSITIVE muscle aches, body  aches  Objective  Blood pressure 110/80, pulse 65, height 5\' 2"  (1.575 m), weight 157 lb (71.2 kg), SpO2 97 %.   General: No apparent distress alert and oriented x3 mood and affect normal, dressed appropriately.  HEENT: Pupils equal, extraocular movements intact  Respiratory: Patient's speak in full sentences and does not appear short of breath  Cardiovascular: No lower extremity edema, non tender, no erythema  Neuro: Cranial nerves II through XII are intact, neurovascularly intact in all extremities with 2+ DTRs and 2+ pulses.  Gait normal with good balance and coordination.  MSK:  Non tender with full range of motion and good stability and symmetric strength and tone of shoulders, elbows, wrist, hip, knee and ankles bilaterally.  Back -back exam still gives some mild discomfort and pain.  Nothing as severe as what it has been in the past.  Some tightness noted with FABER test.  Negative straight leg test.  5-5 strength in lower extremities.  Osteopathic findings  T6 extended rotated and side bent left L2 flexed rotated and side bent right Sacrum right on right       Assessment and Plan:  Low back pain-patient continues to have psoriatic arthropathy with spondylosis of the lumbar spine that I think contributes to some of the discomfort and pain.  Chronic problem with very mild exacerbation.  Discussed with patient about her thyroid medicine this seems to be helpful, Zanaflex as needed, discussed home exercise and icing regimen.  Discussed Armour.  Follow-up again in 4 to 8 weeks  Nonallopathic problems  Decision today to treat with OMT was based on Physical Exam  After verbal consent patient was treated with HVLA, ME, FPR techniques in cervical, rib, thoracic, lumbar, and sacral  areas  Patient tolerated the procedure well with improvement in symptoms  Patient given exercises, stretches and lifestyle modifications  See medications in patient instructions if given  Patient  will follow up in 4-8 weeks      The above documentation has been reviewed and is accurate and complete Lyndal Pulley, DO       Note: This dictation was prepared with Dragon dictation along with smaller phrase technology. Any transcriptional errors that result from this process are unintentional.

## 2020-05-16 NOTE — Patient Instructions (Signed)
We discussed the thyroid Continue the vitamin D See me again in 2 months

## 2020-06-26 ENCOUNTER — Other Ambulatory Visit: Payer: Self-pay | Admitting: Rheumatology

## 2020-06-26 ENCOUNTER — Other Ambulatory Visit: Payer: Self-pay | Admitting: *Deleted

## 2020-06-26 DIAGNOSIS — Z79899 Other long term (current) drug therapy: Secondary | ICD-10-CM | POA: Diagnosis not present

## 2020-06-26 DIAGNOSIS — Z8639 Personal history of other endocrine, nutritional and metabolic disease: Secondary | ICD-10-CM

## 2020-06-26 DIAGNOSIS — L4050 Arthropathic psoriasis, unspecified: Secondary | ICD-10-CM

## 2020-06-26 NOTE — Telephone Encounter (Addendum)
Last Visit: 03/18/2020 Next Visit: 08/19/2020 Labs: 03/18/2020 CBC and CMP WNL TB Gold: 12/19/2019 Neg   Patient advised she is due to update labs. Patient will come to the office this afternoon to update labs.  Current Dose per office note on 03/18/2020:  Cosentyx 300 mg every 28 days Dx:  Psoriatic arthritis   Okay to refill Cosentyx?

## 2020-06-27 LAB — COMPLETE METABOLIC PANEL WITH GFR
AG Ratio: 1.6 (calc) (ref 1.0–2.5)
ALT: 45 U/L — ABNORMAL HIGH (ref 6–29)
AST: 28 U/L (ref 10–35)
Albumin: 4.5 g/dL (ref 3.6–5.1)
Alkaline phosphatase (APISO): 83 U/L (ref 37–153)
BUN: 10 mg/dL (ref 7–25)
CO2: 31 mmol/L (ref 20–32)
Calcium: 9.4 mg/dL (ref 8.6–10.4)
Chloride: 103 mmol/L (ref 98–110)
Creat: 0.75 mg/dL (ref 0.50–1.05)
GFR, Est African American: 105 mL/min/{1.73_m2} (ref >=60)
GFR, Est Non African American: 90 mL/min/{1.73_m2} (ref >=60)
Globulin: 2.8 g/dL (calc) (ref 1.9–3.7)
Glucose, Bld: 80 mg/dL (ref 65–99)
Potassium: 4.4 mmol/L (ref 3.5–5.3)
Sodium: 140 mmol/L (ref 135–146)
Total Bilirubin: 0.5 mg/dL (ref 0.2–1.2)
Total Protein: 7.3 g/dL (ref 6.1–8.1)

## 2020-06-27 LAB — CBC WITH DIFFERENTIAL/PLATELET
Absolute Monocytes: 390 cells/uL (ref 200–950)
Basophils Absolute: 49 cells/uL (ref 0–200)
Basophils Relative: 0.8 %
Eosinophils Absolute: 384 cells/uL (ref 15–500)
Eosinophils Relative: 6.3 %
HCT: 41.2 % (ref 35.0–45.0)
Hemoglobin: 13.3 g/dL (ref 11.7–15.5)
Lymphs Abs: 2068 cells/uL (ref 850–3900)
MCH: 28.2 pg (ref 27.0–33.0)
MCHC: 32.3 g/dL (ref 32.0–36.0)
MCV: 87.5 fL (ref 80.0–100.0)
MPV: 12.2 fL (ref 7.5–12.5)
Monocytes Relative: 6.4 %
Neutro Abs: 3209 cells/uL (ref 1500–7800)
Neutrophils Relative %: 52.6 %
Platelets: 197 10*3/uL (ref 140–400)
RBC: 4.71 10*6/uL (ref 3.80–5.10)
RDW: 13 % (ref 11.0–15.0)
Total Lymphocyte: 33.9 %
WBC: 6.1 10*3/uL (ref 3.8–10.8)

## 2020-06-27 LAB — VITAMIN D 25 HYDROXY (VIT D DEFICIENCY, FRACTURES): Vit D, 25-Hydroxy: 25 ng/mL — ABNORMAL LOW (ref 30–100)

## 2020-06-27 NOTE — Progress Notes (Signed)
CBC is normal, CMP shows elevated LFTs.  She should avoid all NSAIDs and Tylenol.  Vitamin D is low.  She can take vitamin D 2000 units over-the-counter daily or we can call a prescription for 50,000 units once a week for 3 months.  Please discussed with patient.  She will need repeat check vitamin D level in 3 months if she takes prescription vitamin D.

## 2020-06-28 ENCOUNTER — Other Ambulatory Visit: Payer: Self-pay | Admitting: *Deleted

## 2020-06-28 DIAGNOSIS — Z8639 Personal history of other endocrine, nutritional and metabolic disease: Secondary | ICD-10-CM

## 2020-06-28 DIAGNOSIS — E559 Vitamin D deficiency, unspecified: Secondary | ICD-10-CM

## 2020-06-28 MED ORDER — VITAMIN D (ERGOCALCIFEROL) 1.25 MG (50000 UNIT) PO CAPS
50000.0000 [IU] | ORAL_CAPSULE | ORAL | 0 refills | Status: DC
Start: 2020-06-28 — End: 2021-02-19

## 2020-06-28 NOTE — Telephone Encounter (Signed)
-----   Message from Pollyann Savoy, MD sent at 06/27/2020 12:55 PM EDT ----- CBC is normal, CMP shows elevated LFTs.  She should avoid all NSAIDs and Tylenol.  Vitamin D is low.  She can take vitamin D 2000 units over-the-counter daily or we can call a prescription for 50,000 units once a week for 3 months.  Please discussed  with patient.  She will need repeat check vitamin D level in 3 months if she takes prescription vitamin D.

## 2020-07-16 ENCOUNTER — Encounter: Payer: Self-pay | Admitting: Family Medicine

## 2020-07-16 ENCOUNTER — Ambulatory Visit: Payer: BC Managed Care – PPO | Admitting: Family Medicine

## 2020-07-16 ENCOUNTER — Other Ambulatory Visit: Payer: Self-pay

## 2020-07-16 VITALS — BP 110/70 | HR 66 | Ht 62.0 in | Wt 159.0 lb

## 2020-07-16 DIAGNOSIS — M47816 Spondylosis without myelopathy or radiculopathy, lumbar region: Secondary | ICD-10-CM

## 2020-07-16 DIAGNOSIS — M999 Biomechanical lesion, unspecified: Secondary | ICD-10-CM | POA: Diagnosis not present

## 2020-07-16 NOTE — Assessment & Plan Note (Signed)
Patient does have facet joint arthropathy but seems to be stable at this point.  Being very active.  Underlying arthritic changes in the autoimmune likely contributing at this moment.  Patient is to increase activity slowly.  Discussed icing regimen and home exercise, which activities to do which wants to avoid.  Increase activity slowly.  Follow-up again in 8 weeks.

## 2020-07-16 NOTE — Assessment & Plan Note (Signed)
Patient has been doing relatively well overall.  Discussed with patient my posture and dynamics, patient has responded well to manipulation at this point.  No significant changes in medication management, continue core strengthening.  Follow-up again 6 to 12 weeks

## 2020-07-16 NOTE — Progress Notes (Signed)
[name] [address] [place] [address] Phone: [phone] Subjective:   Wendy [name], am serving as a scribe for Dr. Antoine Primas. This visit occurred during the SARS-CoV-2 public health emergency.  Safety protocols were in place, including screening questions prior to the visit, additional usage of staff PPE, and extensive cleaning of exam room while observing appropriate contact time as indicated for disinfecting solutions.  I'm seeing this patient by the request  of:  Wendy Ran, MD  CC: Low back pain  [id number]  Wendy [name] is a 54 y.o. female coming in with complaint of back and neck pain. OMT 05/16/2020. Patient states that she is tight in shoulders and hips.  Medications patient has been prescribed: Armour, Zanaflex  Taking: Yes         Reviewed prior external information including notes and imaging from previsou exam, outside providers and external EMR if available.   As well as notes that were available from care everywhere and other healthcare systems.  Past medical history, social, surgical and family history all reviewed in electronic medical record.  No pertanent information unless stated regarding to the chief complaint.   Past Medical History:  Diagnosis Date  . Anal fissure 2004   Tx'd with diltiazem  gel by Dr. Kendrick Ranch  . Arthritis   . Bronchitis, acute 2008  . Seasonal allergies   . Thyroid disease   . URI 10/12/2007   Qualifier: Diagnosis of  By: Alwyn Ren MD, Chrissie Noa      Allergies  Allergen Reactions  . Pollen Extract   . Arava [Leflunomide] Other (See Comments)    Hair loss  . Methotrexate Derivatives Nausea Only     Review of Systems:  No headache, visual changes, nausea, vomiting, diarrhea, constipation, dizziness, abdominal pain, skin rash, fevers, chills, night sweats, weight loss, swollen lymph nodes,  joint swelling, chest pain, shortness of breath, mood changes. POSITIVE muscle aches,  body aches  Objective  Blood pressure 110/70, pulse 66, height 5\' 2"  (1.575 m), weight 159 lb (72.1 kg), SpO2 98 %.   General: No apparent distress alert and oriented x3 mood and affect normal, dressed appropriately.  HEENT: Pupils equal, extraocular movements intact  Respiratory: Patient's speak in full sentences and does not appear short of breath  Cardiovascular: No lower extremity edema, non tender, no erythema  Neuro: Cranial nerves II through XII are intact, neurovascularly intact in all extremities with 2+ DTRs and 2+ pulses.  Gait normal with good balance and coordination.  MSK:  Non tender with full range of motion and good stability and symmetric strength and tone of shoulders, elbows, wrist, hip, knee and ankles bilaterally.  Back -low back exam does have some loss of lordosis, some tenderness to palpation of paraspinal musculature.  Patient does have some mild tightness with FABER test bilaterally.  Osteopathic findings   T9 extended rotated and side bent left L2 flexed rotated and side bent right Sacrum right on right       Assessment and Plan:  Chronic low back pain without sciatica Patient does have facet joint arthropathy but seems to be stable at this point.  Being very active.  Underlying arthritic changes in the autoimmune likely contributing at this moment.  Patient is to increase activity slowly.  Discussed icing regimen and home exercise, which activities to do which wants to avoid.  Increase activity slowly.  Follow-up again in 8 weeks.  Spondylosis of lumbar region without myelopathy or radiculopathy  Patient has been doing relatively well overall.  Discussed with patient my posture and dynamics, patient has responded well to manipulation at this point.  No significant changes in medication management, continue core strengthening.  Follow-up again 6 to 12 weeks    Nonallopathic problems  Decision today to treat with OMT was based on Physical Exam  After  verbal consent patient was treated with HVLA, ME, FPR techniques in  thoracic, lumbar, and sacral  areas  Patient tolerated the procedure well with improvement in symptoms  Patient given exercises, stretches and lifestyle modifications  See medications in patient instructions if given  Patient will follow up in 4-8 weeks      The above documentation has been reviewed and is accurate and complete Judi Saa, DO       Note: This dictation was prepared with Dragon dictation along with smaller phrase technology. Any transcriptional errors that result from this process are unintentional.

## 2020-07-16 NOTE — Patient Instructions (Signed)
Good to see you  Ice is your friend Stay active Postural necklace could be good.  Shoulders down and back  See me again in 8 weeks

## 2020-07-18 ENCOUNTER — Telehealth: Payer: Self-pay | Admitting: *Deleted

## 2020-07-18 NOTE — Telephone Encounter (Signed)
Patient called to inquire about the booster vaccination.  Discussed the following recommendations with patient.   COVID-19 vaccine recommendations:   COVID-19 vaccine is recommended for everyone (unless you are allergic to a vaccine component), even if you are on a medication that suppresses your immune system.   Do not take Tylenol or any anti-inflammatory medications (NSAIDs) 24 hours prior to the COVID-19 vaccination.   There is no direct evidence about the efficacy of the COVID-19 vaccine in individuals who are on medications that suppress the immune system.   Even if you are fully vaccinated, and you are on any medications that suppress your immune system, please continue to wear a mask, maintain at least six feet social distance and practice hand hygiene.   If you develop a COVID-19 infection, please contact your PCP or our office to determine if you need antibody infusion.  The booster vaccine is now available for immunocompromised patients. It is advised that if you had Pfizer vaccine you should get [employer] booster.  If you had a Moderna vaccine then you should get a Moderna booster. Johnson and [employer] does not have a booster vaccine at this time.  Please see the following web sites for updated information.   https://www.[facility]he[facility].org/Portals/0/Files/COVID-19-Vaccination-Patient-Resources.pdf  https://www.[facility]umatology.org/About-Us/Newsroom/Press-Releases/ID/1159   Patient states she is having drainage and a hacking cough. Patient has been in touch with her PCP and had a rapid Covid test and awaiting results. Patient was given an antibiotic from her PCP. Patient advised to hold her Cosentyx until she has completed the antibiotic and is well. Patient advised to contact the office if her Covid test is positive.

## 2020-07-19 DIAGNOSIS — Z20828 Contact with and (suspected) exposure to other viral communicable diseases: Secondary | ICD-10-CM | POA: Diagnosis not present

## 2020-08-07 NOTE — Progress Notes (Deleted)
Office Visit Note  [name]             Date of Birth: 01-28-1966           MRN: [id number]             PCP: Rodrigo Ran, MD Referring: Rodrigo Ran, MD Visit Date: 08/19/2020 Occupation: @GUAROCC @  Subjective:  No chief complaint on file.   History of Present Illness: Wendy [name] is a 54 y.o. female ***   Activities of Daily Living:  Patient reports morning stiffness for *** {minute/hour:19697}.   Patient {ACTIONS;DENIES/REPORTS:21021675::"Denies"} nocturnal pain.  Difficulty dressing/grooming: {ACTIONS;DENIES/REPORTS:21021675::"Denies"} Difficulty climbing stairs: {ACTIONS;DENIES/REPORTS:21021675::"Denies"} Difficulty getting out of chair: {ACTIONS;DENIES/REPORTS:21021675::"Denies"} Difficulty using hands for taps, buttons, cutlery, and/or writing: {ACTIONS;DENIES/REPORTS:21021675::"Denies"}  No Rheumatology ROS completed.   PMFS History:  Patient Active Problem List   Diagnosis Date Noted  . Nonallopathic lesion of lumbosacral region 11/04/2018  . Nonallopathic lesion of sacral region 11/04/2018  . Nonallopathic lesion of thoracic region 11/04/2018  . Psoriasis 07/05/2017  . DJD (degenerative joint disease), cervical 02/03/2017  . Spondylosis of lumbar region without myelopathy or radiculopathy 02/03/2017  . High risk medication use 10/06/2016  . Plantar fasciitis 10/06/2016  . Achilles tendinitis of both lower extremities 10/06/2016  . Vitamin D deficiency 10/06/2016  . Primary osteoarthritis of both knees 10/06/2016  . Primary osteoarthritis of both feet 10/06/2016  . Chronic low back pain without sciatica 10/06/2016  . Hypothyroidism 10/06/2016  . Polyp of colon 10/06/2016  . B12 deficiency 10/06/2016  . 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 Anal fissure 04/11/2012    Past Medical History:  Diagnosis Date  . Anal fissure 2004   Tx'd with diltiazem  gel by Dr. 2005  . Arthritis   . Bronchitis, acute 2008  . Seasonal allergies   .  Thyroid disease   . URI 10/12/2007   Qualifier: Diagnosis of  By: 13/10/2007 MD, Alwyn Ren History  Problem Relation Age of Onset  . Hypertension Mother   . Diabetes Mother    Past Surgical History:  Procedure Laterality Date  . BREAST SURGERY  12/15/2017   breast reduction    Social History   Social History Narrative  . Not on file   Immunization History  Administered Date(s) Administered  . Pneumococcal Polysaccharide-23 10/28/2010  . Zoster Recombinat (Shingrix) 11/16/2018, 01/30/2019     Objective: Vital Signs: There were no vitals taken for this visit.   Physical Exam   Musculoskeletal Exam: ***  CDAI Exam: CDAI Score: -- Patient Global: --; Provider Global: -- Swollen: --; Tender: -- Joint Exam 08/19/2020   No joint exam has been documented for this visit   There is currently no information documented on the homunculus. Go to the Rheumatology activity and complete the homunculus joint exam.  Investigation: No additional findings.  Imaging: No results found.  Recent Labs: Lab Results  Component Value Date   WBC 6.1 06/26/2020   HGB 13.3 06/26/2020   PLT 197 06/26/2020   NA 140 06/26/2020   K 4.4 06/26/2020   CL 103 06/26/2020   CO2 31 06/26/2020   GLUCOSE 80 06/26/2020   BUN 10 06/26/2020   CREATININE 0.75 06/26/2020   BILITOT 0.5 06/26/2020   ALKPHOS 77 05/27/2017   AST 28 06/26/2020   ALT 45 (H) 06/26/2020   PROT 7.3 06/26/2020   ALBUMIN 3.9 05/27/2017   CALCIUM 9.4 06/26/2020   GFRAA 105 06/26/2020   QFTBGOLDPLUS NEGATIVE 12/19/2019  Speciality Comments: Prior therapy:  failed Humira, Enbrel, Otezla, intolerance to oral methotrexate and Arava  Procedures:  No procedures performed Allergies: Pollen extract, Arava [leflunomide], and Methotrexate derivatives   Assessment / Plan:     Visit Diagnoses: No diagnosis found.  Orders: No orders of the defined types were placed in this encounter.  No orders of the defined types  were placed in this encounter.   Face-to-face time spent with patient was *** minutes. Greater than 50% of time was spent in counseling and coordination of care.  Follow-Up Instructions: No follow-ups on file.   Earnestine Mealing, CMA  Note - This record has been created using Editor, commissioning.  Chart creation errors have been sought, but may not always  have been located. Such creation errors do not reflect on  the standard of medical care.

## 2020-08-19 ENCOUNTER — Ambulatory Visit: Payer: BC Managed Care – PPO | Admitting: Rheumatology

## 2020-08-31 NOTE — Progress Notes (Signed)
Office Visit Note  [name]             Date of Birth: 1966/05/28           MRN: [id number]             PCP: Rodrigo Ran, MD Referring: Rodrigo Ran, MD Visit Date: 09/04/2020 Occupation: @GUAROCC @  Subjective:  Arthritis (Joint aching, stiffness and tenderness)   History of Present Illness: Wendy [name] is a 54 y.o. female with history of psoriatic arthritis, psoriasis, osteoarthritis and degenerative disc disease.  She states that she has been doing quite well on Cosentyx.  She denies any joint pain or joint swelling.  She has been exercising on a regular basis.  She has some stiffness in her joints off and on.  She also notices some stiffness in the SI joints.  She has not had any recent flares of plantar fasciitis.  Activities of Daily Living:  Patient reports morning stiffness for 25 minutes.   Patient Denies nocturnal pain.  Difficulty dressing/grooming: Denies Difficulty climbing stairs: Denies Difficulty getting out of chair: Denies Difficulty using hands for taps, buttons, cutlery, and/or writing: Reports  Review of Systems  Constitutional: Positive for fatigue.  HENT: Positive for mouth dryness.   Eyes: Positive for dryness.  Respiratory: Negative for shortness of breath.   Cardiovascular: Negative for swelling in legs/feet.  Gastrointestinal: Negative for constipation.  Endocrine: Positive for cold intolerance and excessive thirst. Negative for heat intolerance and increased urination.  Genitourinary: Negative for difficulty urinating.  Musculoskeletal: Positive for arthralgias, joint pain, morning stiffness and muscle tenderness. Negative for gait problem and muscle weakness.  Skin: Negative for rash.  Allergic/Immunologic: Positive for susceptible to infections.  Neurological: Positive for numbness. Negative for weakness.  Hematological: Positive for bruising/bleeding tendency.  Psychiatric/Behavioral: Positive for sleep disturbance.    PMFS  History:  Patient Active Problem List   Diagnosis Date Noted  . Nonallopathic lesion of lumbosacral region 11/04/2018  . Nonallopathic lesion of sacral region 11/04/2018  . Nonallopathic lesion of thoracic region 11/04/2018  . Psoriasis 07/05/2017  . DJD (degenerative joint disease), cervical 02/03/2017  . Spondylosis of lumbar region without myelopathy or radiculopathy 02/03/2017  . High risk medication use 10/06/2016  . Plantar fasciitis 10/06/2016  . Achilles tendinitis of both lower extremities 10/06/2016  . Vitamin D deficiency 10/06/2016  . Primary osteoarthritis of both knees 10/06/2016  . Primary osteoarthritis of both feet 10/06/2016  . Chronic low back pain without sciatica 10/06/2016  . Hypothyroidism 10/06/2016  . Polyp of colon 10/06/2016  . B12 deficiency 10/06/2016  . 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 Anal fissure 04/11/2012    Past Medical History:  Diagnosis Date  . Anal fissure 2004   Tx'd with diltiazem  gel by Dr. 2005  . Arthritis   . Bronchitis, acute 2008  . Seasonal allergies   . Thyroid disease   . URI 10/12/2007   Qualifier: Diagnosis of  By: 13/10/2007 MD, Alwyn Ren History  Problem Relation Age of Onset  . Hypertension Mother   . Diabetes Mother    Past Surgical History:  Procedure Laterality Date  . BREAST SURGERY  12/15/2017   breast reduction    Social History   Social History Narrative  . Not on file   Immunization History  Administered Date(s) Administered  . Moderna SARS-COVID-2 Vaccination 12/29/2019, 01/24/2020  . Pneumococcal Polysaccharide-23 10/28/2010  . Zoster Recombinat (Shingrix) 11/16/2018, 01/30/2019  Objective: Vital Signs: BP 123/87 (BP Location: Left Arm, Patient Position: Sitting, Cuff Size: Normal)   Pulse 82   Resp 14   Ht 5\' 2"  (1.575 m)   Wt 159 lb 8 oz (72.3 kg)   BMI 29.17 kg/m    Physical Exam Vitals and nursing note reviewed.  Constitutional:      Appearance: She is  well-developed.  HENT:     Head: Normocephalic and atraumatic.  Eyes:     Conjunctiva/sclera: Conjunctivae normal.  Cardiovascular:     Rate and Rhythm: Normal rate and regular rhythm.     Heart sounds: Normal heart sounds.  Pulmonary:     Effort: Pulmonary effort is normal.     Breath sounds: Normal breath sounds.  Abdominal:     General: Bowel sounds are normal.     Palpations: Abdomen is soft.  Musculoskeletal:     Cervical back: Normal range of motion.  Lymphadenopathy:     Cervical: No cervical adenopathy.  Skin:    General: Skin is warm and dry.     Capillary Refill: Capillary refill takes less than 2 seconds.  Neurological:     Mental Status: She is alert and oriented to person, place, and time.  Psychiatric:        Behavior: Behavior normal.      Musculoskeletal Exam: C-spine thoracic and lumbar spine with good range of motion.  Shoulder joints, elbow joints, wrist joints, MCPs PIPs and DIPs with good range of motion with no synovitis.  Hip joints, knee joints, ankles, MTPs and PIPs with good range of motion with no synovitis.  CDAI Exam: CDAI Score: -- Patient Global: --; Provider Global: -- Swollen: --; Tender: -- Joint Exam 09/04/2020   No joint exam has been documented for this visit   There is currently no information documented on the homunculus. Go to the Rheumatology activity and complete the homunculus joint exam.  Investigation: No additional findings.  Imaging: No results found.  Recent Labs: Lab Results  Component Value Date   WBC 6.1 06/26/2020   HGB 13.3 06/26/2020   PLT 197 06/26/2020   NA 140 06/26/2020   K 4.4 06/26/2020   CL 103 06/26/2020   CO2 31 06/26/2020   GLUCOSE 80 06/26/2020   BUN 10 06/26/2020   CREATININE 0.75 06/26/2020   BILITOT 0.5 06/26/2020   ALKPHOS 77 05/27/2017   AST 28 06/26/2020   ALT 45 (H) 06/26/2020   PROT 7.3 06/26/2020   ALBUMIN 3.9 05/27/2017   CALCIUM 9.4 06/26/2020   GFRAA 105 06/26/2020    QFTBGOLDPLUS NEGATIVE 12/19/2019  June 26, 2020 vitamin D 25, TSH 0.04  Speciality Comments: Prior therapy:  failed Humira, Enbrel, Otezla, intolerance to oral methotrexate and Arava Otrexup x1 dose and then discontinued  Procedures:  No procedures performed Allergies: Pollen extract, Arava [leflunomide], and Methotrexate derivatives   Assessment / Plan:     Visit Diagnoses: Psoriatic arthritis (HCC) -she has been clinically doing well with no increased pain and discomfort.  I will obtain x-rays today to monitor for disease activity.  She has some stiffness in her hands.  Plan: XR Hand 2 View Right, XR Hand 2 View Left, XR Foot 2 Views Right, XR Foot 2 Views Left.  X-rays were reviewed and no radiographic progression was noted.  Psoriasis -she has not had any recent psoriasis lesions.    High risk medication use - Cosentyx 300 mg every 28 days.  Her labs have been stable.  Her LFTs were  mildly elevated.  Avoidance of Tylenol, NSAIDs, alcohol was discussed.  She believes she may have gained some weight.  Sacroiliac pain-improved.  She has some stiffness in SI joints off and on.  Primary osteoarthritis of both knees-she had good range of motion without any warmth swelling or effusion.  Primary osteoarthritis of both feet -she has some discomfort in her feet off and on and some plantar fasciitis.  No synovitis was noted.  Plan: XR Foot 2 Views Right, XR Foot 2 Views Left  DDD (degenerative disc disease), cervical-she had good range of motion.  DDD (degenerative disc disease), lumbar-she continues to have some stiffness but the pain is improved overall.  Myalgia-she had no muscular weakness or tenderness today on examination.  Vitamin D deficiency-she was recently diagnosed with vitamin D deficiency.  She has been taking vitamin D.  We will check vitamin D level in 3 months.  History of hypothyroidism-her TSH was low recently.  She has been followed by Dr. Sherrye Payor.  Educated about  COVID-19 virus infection-she is fully vaccinated against COVID-19.  I have advised her to get the booster once available to her.  Use of mask, social distancing and hand hygiene was discussed.  Use of monoclonal antibodies was also discussed in case she develops COVID-19 infection.  Orders: Orders Placed This Encounter  Procedures  . XR Hand 2 View Right  . XR Hand 2 View Left  . XR Foot 2 Views Right  . XR Foot 2 Views Left   No orders of the defined types were placed in this encounter.   .  Follow-Up Instructions: Return in about 5 months (around 02/02/2021) for Psoriatic arthritis.   Pollyann Savoy, MD  Note - This record has been created using Animal nutritionist.  Chart creation errors have been sought, but may not always  have been located. Such creation errors do not reflect on  the standard of medical care.

## 2020-09-04 ENCOUNTER — Ambulatory Visit: Payer: Self-pay

## 2020-09-04 ENCOUNTER — Encounter: Payer: Self-pay | Admitting: Rheumatology

## 2020-09-04 ENCOUNTER — Other Ambulatory Visit: Payer: Self-pay

## 2020-09-04 ENCOUNTER — Ambulatory Visit (INDEPENDENT_AMBULATORY_CARE_PROVIDER_SITE_OTHER): Payer: BC Managed Care – PPO | Admitting: Rheumatology

## 2020-09-04 VITALS — BP 123/87 | HR 82 | Resp 14 | Ht 62.0 in | Wt 159.5 lb

## 2020-09-04 DIAGNOSIS — M79641 Pain in right hand: Secondary | ICD-10-CM | POA: Diagnosis not present

## 2020-09-04 DIAGNOSIS — M19071 Primary osteoarthritis, right ankle and foot: Secondary | ICD-10-CM

## 2020-09-04 DIAGNOSIS — M19072 Primary osteoarthritis, left ankle and foot: Secondary | ICD-10-CM | POA: Diagnosis not present

## 2020-09-04 DIAGNOSIS — M79642 Pain in left hand: Secondary | ICD-10-CM

## 2020-09-04 DIAGNOSIS — M533 Sacrococcygeal disorders, not elsewhere classified: Secondary | ICD-10-CM

## 2020-09-04 DIAGNOSIS — L4050 Arthropathic psoriasis, unspecified: Secondary | ICD-10-CM | POA: Diagnosis not present

## 2020-09-04 DIAGNOSIS — Z79899 Other long term (current) drug therapy: Secondary | ICD-10-CM

## 2020-09-04 DIAGNOSIS — M170 Bilateral primary osteoarthritis of knee: Secondary | ICD-10-CM

## 2020-09-04 DIAGNOSIS — M5030 Other cervical disc degeneration, unspecified cervical region: Secondary | ICD-10-CM

## 2020-09-04 DIAGNOSIS — Z8639 Personal history of other endocrine, nutritional and metabolic disease: Secondary | ICD-10-CM

## 2020-09-04 DIAGNOSIS — M791 Myalgia, unspecified site: Secondary | ICD-10-CM

## 2020-09-04 DIAGNOSIS — Z7189 Other specified counseling: Secondary | ICD-10-CM

## 2020-09-04 DIAGNOSIS — L409 Psoriasis, unspecified: Secondary | ICD-10-CM

## 2020-09-04 DIAGNOSIS — E559 Vitamin D deficiency, unspecified: Secondary | ICD-10-CM

## 2020-09-04 DIAGNOSIS — M5136 Other intervertebral disc degeneration, lumbar region: Secondary | ICD-10-CM

## 2020-09-04 NOTE — Patient Instructions (Addendum)
COVID-19 vaccine recommendations:   COVID-19 vaccine is recommended for everyone (unless you are allergic to a vaccine component), even if you are on a medication that suppresses your immune system.    Do not take Tylenol or any anti-inflammatory medications (NSAIDs) 24 hours prior to the COVID-19 vaccination.   There is no direct evidence about the efficacy of the COVID-19 vaccine in individuals who are on medications that suppress the immune system.   Even if you are fully vaccinated, and you are on any medications that suppress your immune system, please continue to wear a mask, maintain at least six feet social distance and practice hand hygiene.   If you develop a COVID-19 infection, please contact your PCP or our office to determine if you need antibody infusion.  The booster vaccine is now available for immunocompromised patients. It is advised that if you had Pfizer vaccine you should get [employer] booster.  If you had a Moderna vaccine then you should get a Moderna booster. [employer] does not have a booster vaccine at this time.  Please see the following web sites for updated information.   [email or web address]://[email or web address]/[email or web address]/[email or web address]-Vaccination-Patient-Resources.[email or web address]  [email or web address]://[email or web address]/About-[email or web address]-Releases/ID/1159   Standing Labs We placed an order today for your standing lab work.   Please have your standing labs drawn in October and every 3 months  If possible, please have your labs drawn 2 weeks prior to your appointment so that the provider can discuss your results at your appointment.  We have open lab daily Monday through Thursday from 8:30-12:30 PM and 1:30-4:30 PM and Friday from 8:30-12:30 PM and 1:30-4:00 PM at the office of Dr. Pollyann Savoy, [facility].   Please be advised, patients with office appointments requiring lab work will take precedents over walk-in lab work.  If possible, please come for your lab  work on Monday and Friday afternoons, as you may experience shorter wait times. The office is located at [address], [address], [place], [place] [address] No appointment is necessary.   Labs are drawn by [facility]. Please bring your co-pay at the time of your lab draw.  You may receive a bill from [facility] for your lab work.  If you wish to have your labs drawn at another location, please call the office 24 hours in advance to send orders.  If you have any questions regarding directions or hours of operation,  please call [phone].   As a reminder, please drink plenty of water prior to coming for your lab work. Thanks!

## 2020-09-10 ENCOUNTER — Ambulatory Visit: Payer: BC Managed Care – PPO | Admitting: Family Medicine

## 2020-09-20 DIAGNOSIS — Z03822 Encounter for observation for suspected aspirated (inhaled) foreign body ruled out: Secondary | ICD-10-CM | POA: Diagnosis not present

## 2020-09-23 ENCOUNTER — Other Ambulatory Visit: Payer: Self-pay | Admitting: Rheumatology

## 2020-10-02 DIAGNOSIS — E559 Vitamin D deficiency, unspecified: Secondary | ICD-10-CM | POA: Diagnosis not present

## 2020-10-02 DIAGNOSIS — E039 Hypothyroidism, unspecified: Secondary | ICD-10-CM | POA: Diagnosis not present

## 2020-10-16 ENCOUNTER — Other Ambulatory Visit: Payer: Self-pay | Admitting: Rheumatology

## 2020-10-16 DIAGNOSIS — L4050 Arthropathic psoriasis, unspecified: Secondary | ICD-10-CM

## 2020-10-16 NOTE — Telephone Encounter (Signed)
Last Visit: 09/04/2020 Next Visit: 02/03/2021 Labs: 06/26/2020 CBC is normal, CMP shows elevated LFTs. TB Gold: 12/19/2019 Neg   Current Dose per office note 09/04/2020: Cosentyx 300 mg every 28 days  DX: Psoriatic arthritis   Patient advised she is due to update labs. Patient will try to update today.   Okay to refill Cosentyx?

## 2020-10-17 ENCOUNTER — Other Ambulatory Visit: Payer: Self-pay | Admitting: *Deleted

## 2020-10-17 DIAGNOSIS — E559 Vitamin D deficiency, unspecified: Secondary | ICD-10-CM

## 2020-10-17 DIAGNOSIS — Z79899 Other long term (current) drug therapy: Secondary | ICD-10-CM

## 2020-10-18 LAB — CBC WITH DIFFERENTIAL/PLATELET
Absolute Monocytes: 400 cells/uL (ref 200–950)
Basophils Absolute: 52 cells/uL (ref 0–200)
Basophils Relative: 0.7 %
Eosinophils Absolute: 747 cells/uL — ABNORMAL HIGH (ref 15–500)
Eosinophils Relative: 10.1 %
HCT: 39.1 % (ref 35.0–45.0)
Hemoglobin: 13 g/dL (ref 11.7–15.5)
Lymphs Abs: 2153 cells/uL (ref 850–3900)
MCH: 28.6 pg (ref 27.0–33.0)
MCHC: 33.2 g/dL (ref 32.0–36.0)
MCV: 86.1 fL (ref 80.0–100.0)
MPV: 12.7 fL — ABNORMAL HIGH (ref 7.5–12.5)
Monocytes Relative: 5.4 %
Neutro Abs: 4048 cells/uL (ref 1500–7800)
Neutrophils Relative %: 54.7 %
Platelets: 202 10*3/uL (ref 140–400)
RBC: 4.54 10*6/uL (ref 3.80–5.10)
RDW: 12.7 % (ref 11.0–15.0)
Total Lymphocyte: 29.1 %
WBC: 7.4 10*3/uL (ref 3.8–10.8)

## 2020-10-18 LAB — COMPLETE METABOLIC PANEL WITH GFR
AG Ratio: 1.6 (calc) (ref 1.0–2.5)
ALT: 42 U/L — ABNORMAL HIGH (ref 6–29)
AST: 29 U/L (ref 10–35)
Albumin: 4.2 g/dL (ref 3.6–5.1)
Alkaline phosphatase (APISO): 95 U/L (ref 37–153)
BUN: 11 mg/dL (ref 7–25)
CO2: 28 mmol/L (ref 20–32)
Calcium: 9.6 mg/dL (ref 8.6–10.4)
Chloride: 104 mmol/L (ref 98–110)
Creat: 0.73 mg/dL (ref 0.50–1.05)
GFR, Est African American: 108 mL/min/{1.73_m2} (ref >=60)
GFR, Est Non African American: 93 mL/min/{1.73_m2} (ref >=60)
Globulin: 2.7 g/dL (calc) (ref 1.9–3.7)
Glucose, Bld: 102 mg/dL (ref 65–139)
Potassium: 4.1 mmol/L (ref 3.5–5.3)
Sodium: 140 mmol/L (ref 135–146)
Total Bilirubin: 0.3 mg/dL (ref 0.2–1.2)
Total Protein: 6.9 g/dL (ref 6.1–8.1)

## 2020-10-18 LAB — VITAMIN D 25 HYDROXY (VIT D DEFICIENCY, FRACTURES): Vit D, 25-Hydroxy: 31 ng/mL (ref 30–100)

## 2020-10-18 NOTE — Progress Notes (Signed)
Vitamin D is low normal.  Recommend vitamin D 2000 units daily.  Liver function is mildly elevated.  Advise avoiding all NSAIDs and alcohol use.

## 2020-11-01 DIAGNOSIS — Z6829 Body mass index (BMI) 29.0-29.9, adult: Secondary | ICD-10-CM | POA: Diagnosis not present

## 2020-11-01 DIAGNOSIS — Z01419 Encounter for gynecological examination (general) (routine) without abnormal findings: Secondary | ICD-10-CM | POA: Diagnosis not present

## 2020-11-01 DIAGNOSIS — N941 Unspecified dyspareunia: Secondary | ICD-10-CM | POA: Diagnosis not present

## 2020-11-01 DIAGNOSIS — R87610 Atypical squamous cells of undetermined significance on cytologic smear of cervix (ASC-US): Secondary | ICD-10-CM | POA: Diagnosis not present

## 2020-11-01 DIAGNOSIS — R232 Flushing: Secondary | ICD-10-CM | POA: Diagnosis not present

## 2020-11-01 DIAGNOSIS — N393 Stress incontinence (female) (male): Secondary | ICD-10-CM | POA: Diagnosis not present

## 2020-11-01 DIAGNOSIS — Z1231 Encounter for screening mammogram for malignant neoplasm of breast: Secondary | ICD-10-CM | POA: Diagnosis not present

## 2020-11-20 DIAGNOSIS — H11153 Pinguecula, bilateral: Secondary | ICD-10-CM | POA: Diagnosis not present

## 2020-11-20 DIAGNOSIS — H04123 Dry eye syndrome of bilateral lacrimal glands: Secondary | ICD-10-CM | POA: Diagnosis not present

## 2020-12-05 DIAGNOSIS — Z23 Encounter for immunization: Secondary | ICD-10-CM | POA: Diagnosis not present

## 2020-12-27 ENCOUNTER — Other Ambulatory Visit: Payer: Self-pay | Admitting: Family Medicine

## 2020-12-30 NOTE — Telephone Encounter (Signed)
Need to have patient primary take over with [place] not seeing her much

## 2020-12-31 DIAGNOSIS — E559 Vitamin D deficiency, unspecified: Secondary | ICD-10-CM | POA: Diagnosis not present

## 2020-12-31 DIAGNOSIS — E039 Hypothyroidism, unspecified: Secondary | ICD-10-CM | POA: Diagnosis not present

## 2020-12-31 DIAGNOSIS — R5383 Other fatigue: Secondary | ICD-10-CM | POA: Diagnosis not present

## 2020-12-31 DIAGNOSIS — L659 Nonscarring hair loss, unspecified: Secondary | ICD-10-CM | POA: Diagnosis not present

## 2021-01-03 DIAGNOSIS — R5383 Other fatigue: Secondary | ICD-10-CM | POA: Diagnosis not present

## 2021-01-03 DIAGNOSIS — L659 Nonscarring hair loss, unspecified: Secondary | ICD-10-CM | POA: Diagnosis not present

## 2021-01-03 DIAGNOSIS — E039 Hypothyroidism, unspecified: Secondary | ICD-10-CM | POA: Diagnosis not present

## 2021-02-03 ENCOUNTER — Ambulatory Visit: Payer: BC Managed Care – PPO | Admitting: Rheumatology

## 2021-02-04 NOTE — Progress Notes (Signed)
Office Visit Note  [name]             Date of Birth: November 29, 1966           MRN: [id number]             PCP: Rodrigo Ran, MD Referring: Rodrigo Ran, MD Visit Date: 02/18/2021 Occupation: @GUAROCC @  Subjective:  Medication management.   History of Present Illness: Wendy [name] is a 55 y.o. female history of psoriatic arthritis and psoriasis.  She states that she continues to have some stiffness in her hands.  She denies any SI joint discomfort.  She has off-and-on discomfort in her left knee joint.  Her lower back pain has been better.  She does find any discomfort in her cervical spine.  She has not had any episodes of plantar fasciitis or Achilles tendinitis.  She has had some discomfort in her MTPs.  Activities of Daily Living:  Patient reports morning stiffness for 30 minutes.   Patient Reports nocturnal pain.  Difficulty dressing/grooming: Denies Difficulty climbing stairs: Reports Difficulty getting out of chair: Denies Difficulty using hands for taps, buttons, cutlery, and/or writing: Reports  Review of Systems  Constitutional: Positive for fatigue.  HENT: Positive for mouth dryness. Negative for mouth sores and nose dryness.   Eyes: Negative for pain, itching and dryness.  Respiratory: Negative for shortness of breath and difficulty breathing.   Cardiovascular: Negative for chest pain and palpitations.  Gastrointestinal: Negative for blood in stool, constipation and diarrhea.  Endocrine: Negative for increased urination.  Genitourinary: Negative for difficulty urinating.  Musculoskeletal: Positive for myalgias, morning stiffness and myalgias. Negative for arthralgias, joint pain, joint swelling and muscle tenderness.  Skin: Positive for rash. Negative for color change and redness.  Allergic/Immunologic: Positive for susceptible to infections.  Neurological: Negative for dizziness, numbness, headaches, memory loss and weakness.  Hematological: Negative for  bruising/bleeding tendency.  Psychiatric/Behavioral: Negative for confusion.    PMFS History:  Patient Active Problem List   Diagnosis Date Noted  . Nonallopathic lesion of lumbosacral region 11/04/2018  . Nonallopathic lesion of sacral region 11/04/2018  . Nonallopathic lesion of thoracic region 11/04/2018  . Psoriasis 07/05/2017  . DJD (degenerative joint disease), cervical 02/03/2017  . Spondylosis of lumbar region without myelopathy or radiculopathy 02/03/2017  . High risk medication use 10/06/2016  . Plantar fasciitis 10/06/2016  . Achilles tendinitis of both lower extremities 10/06/2016  . Vitamin D deficiency 10/06/2016  . Primary osteoarthritis of both knees 10/06/2016  . Primary osteoarthritis of both feet 10/06/2016  . Chronic low back pain without sciatica 10/06/2016  . Hypothyroidism 10/06/2016  . Polyp of colon 10/06/2016  . B12 deficiency 10/06/2016  . 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 Anal fissure 04/11/2012    Past Medical History:  Diagnosis Date  . Anal fissure 2004   Tx'd with diltiazem  gel by Dr. 2005  . Arthritis   . Bronchitis, acute 2008  . Seasonal allergies   . Thyroid disease   . URI 10/12/2007   Qualifier: Diagnosis of  By: 13/10/2007 MD, Alwyn Ren History  Problem Relation Age of Onset  . Hypertension Mother   . Diabetes Mother   . Liver disease Sister        liver transplant   . Cirrhosis Sister        non-alcoholic    Past Surgical History:  Procedure Laterality Date  . BREAST SURGERY  12/15/2017   breast reduction  Social History   Social History Narrative  . Not on file   Immunization History  Administered Date(s) Administered  . Moderna Sars-Covid-2 Vaccination 12/29/2019, 01/24/2020, 12/05/2020  . Pneumococcal Polysaccharide-23 10/28/2010  . Zoster Recombinat (Shingrix) 11/16/2018, 01/30/2019     Objective: Vital Signs: BP 127/85 (BP Location: Left Arm, Patient Position: Sitting, Cuff Size: Normal)    Pulse (!) 58   Resp 14   Ht 5\' 2"  (1.575 m)   Wt 157 lb 12.8 oz (71.6 kg)   BMI 28.86 kg/m    Physical Exam Vitals and nursing note reviewed.  Constitutional:      Appearance: She is well-developed.  HENT:     Head: Normocephalic and atraumatic.  Eyes:     Conjunctiva/sclera: Conjunctivae normal.  Cardiovascular:     Rate and Rhythm: Normal rate and regular rhythm.     Heart sounds: Normal heart sounds.  Pulmonary:     Effort: Pulmonary effort is normal.     Breath sounds: Normal breath sounds.  Abdominal:     General: Bowel sounds are normal.     Palpations: Abdomen is soft.  Musculoskeletal:     Cervical back: Normal range of motion.  Lymphadenopathy:     Cervical: No cervical adenopathy.  Skin:    General: Skin is warm and dry.     Capillary Refill: Capillary refill takes less than 2 seconds.  Neurological:     Mental Status: She is alert and oriented to person, place, and time.  Psychiatric:        Behavior: Behavior normal.      Musculoskeletal Exam: C-spine thoracic and lumbar spine were in good range of motion.  Shoulder joints, elbow joints, wrist joints, MCPs PIPs and DIPs with good range of motion with no synovitis.  Hip joints, knee joints, ankles, MTPs and PIPs with good range of motion with no synovitis.  CDAI Exam: CDAI Score: - Patient Global: 5 mm; Mathilde Mcwherter Global: - Swollen: 0 ; Tender: 0  Joint Exam 02/18/2021   No joint exam has been documented for this visit   There is currently no information documented on the homunculus. Go to the Rheumatology activity and complete the homunculus joint exam.  Investigation: No additional findings.  Imaging: No results found.  Recent Labs: Lab Results  Component Value Date   WBC 7.4 10/17/2020   HGB 13.0 10/17/2020   PLT 202 10/17/2020   NA 140 10/17/2020   K 4.1 10/17/2020   CL 104 10/17/2020   CO2 28 10/17/2020   GLUCOSE 102 10/17/2020   BUN 11 10/17/2020   CREATININE 0.73 10/17/2020    BILITOT 0.3 10/17/2020   ALKPHOS 77 05/27/2017   AST 29 10/17/2020   ALT 42 (H) 10/17/2020   PROT 6.9 10/17/2020   ALBUMIN 3.9 05/27/2017   CALCIUM 9.6 10/17/2020   GFRAA 108 10/17/2020   QFTBGOLDPLUS NEGATIVE 12/19/2019    Speciality Comments: Prior therapy:  failed Humira, Enbrel, Otezla, intolerance to oral methotrexate and Arava Otrexup x1 dose and then discontinued  Procedures:  No procedures performed Allergies: Pollen extract, Arava [leflunomide], and Methotrexate derivatives   Assessment / Plan:     Visit Diagnoses: Psoriatic arthritis (HCC)-patient had no synovitis on my examination.  She continues to have some stiffness.  She has been tolerating Cosentyx well.  She has had no recent episode of SI joint arthritis.  Psoriasis-she has some psoriasis behind her right ear and in the nape of her neck.  I sent a prescription for clobetasol scalp  solution.  High risk medication use - Cosentyx 300 mg every 28 days. - Plan: CBC with Differential/Platelet, COMPLETE METABOLIC PANEL WITH GFR, QuantiFERON-TB Gold Plus today and then every 3 months to monitor for drug toxicity.  She has been fully vaccinated against COVID-19.  Booster 3 to 6 months after the third dose was discussed.  She also received Shingrix vaccine.  Stopping Cosentyx for infection and resuming after the infection resolves was discussed.  Elevated LFTs-LFTs have been mildly elevated.  I will check LFTs again.  We discussed possibility of fatty liver.  Dietary modifications were discussed at length.  I will also send her for a nutrition consult.  If her LFTs are elevated again I may consider getting ultrasound to rule out fatty liver.  Her sister was recently diagnosed with fatty liver and underwent liver transplant.  Sacroiliac pain-currently not symptomatic.  Primary osteoarthritis of both knees-she continues to have some discomfort in her left knee joint.  She has left patellofemoral syndrome.  Lower extremity muscle  strengthening exercises were discussed.  Primary osteoarthritis of both feet-she continues to have some discomfort in her MTPs.  DDD (degenerative disc disease), cervical-she had good range of motion with no limitation.  DDD (degenerative disc disease), lumbar-she denies any lower back pain currently.  Myalgia-she continues to have some muscle pain.  Vitamin D deficiency -she has history of vitamin D deficiency.  I will check vitamin D level today.  Plan: VITAMIN D 25 Hydroxy (Vit-D Deficiency, Fractures)  History of hypothyroidism  Orders: Orders Placed This Encounter  Procedures  . CBC with Differential/Platelet  . COMPLETE METABOLIC PANEL WITH GFR  . VITAMIN D 25 Hydroxy (Vit-D Deficiency, Fractures)  . QuantiFERON-TB Gold Plus  . Amb Ref to Medical Weight Management   Meds ordered this encounter  Medications  . clobetasol (TEMOVATE) 0.05 % external solution    Sig: Apply 1 application topically 2 (two) times daily.    Dispense:  50 mL    Refill:  0     Follow-Up Instructions: Return in about 3 months (around 05/21/2021) for Psoriatic arthritis.   Pollyann Savoy, MD  Note - This record has been created using Animal nutritionist.  Chart creation errors have been sought, but may not always  have been located. Such creation errors do not reflect on  the standard of medical care.

## 2021-02-18 ENCOUNTER — Encounter: Payer: Self-pay | Admitting: Rheumatology

## 2021-02-18 ENCOUNTER — Ambulatory Visit: Payer: BC Managed Care – PPO | Admitting: Rheumatology

## 2021-02-18 ENCOUNTER — Other Ambulatory Visit: Payer: Self-pay

## 2021-02-18 VITALS — BP 127/85 | HR 58 | Resp 14 | Ht 62.0 in | Wt 157.8 lb

## 2021-02-18 DIAGNOSIS — L409 Psoriasis, unspecified: Secondary | ICD-10-CM | POA: Diagnosis not present

## 2021-02-18 DIAGNOSIS — M170 Bilateral primary osteoarthritis of knee: Secondary | ICD-10-CM

## 2021-02-18 DIAGNOSIS — M5136 Other intervertebral disc degeneration, lumbar region: Secondary | ICD-10-CM

## 2021-02-18 DIAGNOSIS — L4050 Arthropathic psoriasis, unspecified: Secondary | ICD-10-CM

## 2021-02-18 DIAGNOSIS — E559 Vitamin D deficiency, unspecified: Secondary | ICD-10-CM | POA: Diagnosis not present

## 2021-02-18 DIAGNOSIS — Z8639 Personal history of other endocrine, nutritional and metabolic disease: Secondary | ICD-10-CM

## 2021-02-18 DIAGNOSIS — M19071 Primary osteoarthritis, right ankle and foot: Secondary | ICD-10-CM

## 2021-02-18 DIAGNOSIS — M533 Sacrococcygeal disorders, not elsewhere classified: Secondary | ICD-10-CM | POA: Diagnosis not present

## 2021-02-18 DIAGNOSIS — Z79899 Other long term (current) drug therapy: Secondary | ICD-10-CM

## 2021-02-18 DIAGNOSIS — M5030 Other cervical disc degeneration, unspecified cervical region: Secondary | ICD-10-CM

## 2021-02-18 DIAGNOSIS — M791 Myalgia, unspecified site: Secondary | ICD-10-CM

## 2021-02-18 DIAGNOSIS — Z789 Other specified health status: Secondary | ICD-10-CM

## 2021-02-18 DIAGNOSIS — Z7689 Persons encountering health services in other specified circumstances: Secondary | ICD-10-CM

## 2021-02-18 DIAGNOSIS — M19072 Primary osteoarthritis, left ankle and foot: Secondary | ICD-10-CM

## 2021-02-18 MED ORDER — CLOBETASOL PROPIONATE 0.05 % EX SOLN: 1.0000 "application " | Freq: Two times a day (BID) | CUTANEOUS | 0 refills | Status: DC

## 2021-02-18 NOTE — Patient Instructions (Addendum)
Journal for Nurse Practitioners, 15(4), [phone]. Retrieved September 05, 2018 from http://clinicalkey.com/nursing">  Knee Exercises Ask your health care provider which exercises are safe for you. Do exercises exactly as told by your health care provider and adjust them as directed. It is normal to feel mild stretching, pulling, tightness, or discomfort as you do these exercises. Stop right away if you feel sudden pain or your pain gets worse. Do not begin these exercises until told by your health care provider. Stretching and range-of-motion exercises These exercises warm up your muscles and joints and improve the movement and flexibility of your knee. These exercises also help to relieve pain and swelling. Knee extension, prone 1. Lie on your abdomen (prone position) on a bed. 2. Place your left / right knee just beyond the edge of the surface so your knee is not on the bed. You can put a towel under your left / right thigh just above your kneecap for comfort. 3. Relax your leg muscles and allow gravity to straighten your knee (extension). You should feel a stretch behind your left / right knee. 4. Hold this position for __________ seconds. 5. Scoot up so your knee is supported between repetitions. Repeat __________ times. Complete this exercise __________ times a day. Knee flexion, active 1. Lie on your back with both legs straight. If this causes back discomfort, bend your left / right knee so your foot is flat on the floor. 2. Slowly slide your left / right heel back toward your buttocks. Stop when you feel a gentle stretch in the front of your knee or thigh (flexion). 3. Hold this position for __________ seconds. 4. Slowly slide your left / right heel back to the starting position. Repeat __________ times. Complete this exercise __________ times a day.   Quadriceps stretch, prone 1. Lie on your abdomen on a firm surface, such as a bed or padded floor. 2. Bend your left / right knee and hold  your ankle. If you cannot reach your ankle or pant leg, loop a belt around your foot and grab the belt instead. 3. Gently pull your heel toward your buttocks. Your knee should not slide out to the side. You should feel a stretch in the front of your thigh and knee (quadriceps). 4. Hold this position for __________ seconds. Repeat __________ times. Complete this exercise __________ times a day.   Hamstring, supine 1. Lie on your back (supine position). 2. Loop a belt or towel over the ball of your left / right foot. The ball of your foot is on the walking surface, right under your toes. 3. Straighten your left / right knee and slowly pull on the belt to raise your leg until you feel a gentle stretch behind your knee (hamstring). ? Do not let your knee bend while you do this. ? Keep your other leg flat on the floor. 4. Hold this position for __________ seconds. Repeat __________ times. Complete this exercise __________ times a day. Strengthening exercises These exercises build strength and endurance in your knee. Endurance is the ability to use your muscles for a long time, even after they get tired. Quadriceps, isometric This exercise stretches the muscles in front of your thigh (quadriceps) without moving your knee joint (isometric). 1. Lie on your back with your left / right leg extended and your other knee bent. Put a rolled towel or small pillow under your knee if told by your health care provider. 2. Slowly tense the muscles in the front of your  left / right thigh. You should see your kneecap slide up toward your hip or see increased dimpling just above the knee. This motion will push the back of the knee toward the floor. 3. For __________ seconds, hold the muscle as tight as you can without increasing your pain. 4. Relax the muscles slowly and completely. Repeat __________ times. Complete this exercise __________ times a day.   Straight leg raises This exercise stretches the muscles in  front of your thigh (quadriceps) and the muscles that move your hips (hip flexors). 1. Lie on your back with your left / right leg extended and your other knee bent. 2. Tense the muscles in the front of your left / right thigh. You should see your kneecap slide up or see increased dimpling just above the knee. Your thigh may even shake a bit. 3. Keep these muscles tight as you raise your leg 4-6 inches (10-15 cm) off the floor. Do not let your knee bend. 4. Hold this position for __________ seconds. 5. Keep these muscles tense as you lower your leg. 6. Relax your muscles slowly and completely after each repetition. Repeat __________ times. Complete this exercise __________ times a day. Hamstring, isometric 1. Lie on your back on a firm surface. 2. Bend your left / right knee about __________ degrees. 3. Dig your left / right heel into the surface as if you are trying to pull it toward your buttocks. Tighten the muscles in the back of your thighs (hamstring) to "dig" as hard as you can without increasing any pain. 4. Hold this position for __________ seconds. 5. Release the tension gradually and allow your muscles to relax completely for __________ seconds after each repetition. Repeat __________ times. Complete this exercise __________ times a day. Hamstring curls If told by your health care provider, do this exercise while wearing ankle weights. Begin with __________ lb weights. Then increase the weight by 1 lb (0.5 kg) increments. Do not wear ankle weights that are more than __________ lb. 1. Lie on your abdomen with your legs straight. 2. Bend your left / right knee as far as you can without feeling pain. Keep your hips flat against the floor. 3. Hold this position for __________ seconds. 4. Slowly lower your leg to the starting position. Repeat __________ times. Complete this exercise __________ times a day.   Squats This exercise strengthens the muscles in front of your thigh and knee  (quadriceps). 1. Stand in front of a table, with your feet and knees pointing straight ahead. You may rest your hands on the table for balance but not for support. 2. Slowly bend your knees and lower your hips like you are going to sit in a chair. ? Keep your weight over your heels, not over your toes. ? Keep your lower legs upright so they are parallel with the table legs. ? Do not let your hips go lower than your knees. ? Do not bend lower than told by your health care provider. ? If your knee pain increases, do not bend as low. 3. Hold the squat position for __________ seconds. 4. Slowly push with your legs to return to standing. Do not use your hands to pull yourself to standing. Repeat __________ times. Complete this exercise __________ times a day. Wall slides This exercise strengthens the muscles in front of your thigh and knee (quadriceps). 1. Lean your back against a smooth wall or door, and walk your feet out 18-24 inches (46-61 cm) from it. 2.   Place your feet hip-width apart. 3. Slowly slide down the wall or door until your knees bend __________ degrees. Keep your knees over your heels, not over your toes. Keep your knees in line with your hips. 4. Hold this position for __________ seconds. Repeat __________ times. Complete this exercise __________ times a day.   Straight leg raises This exercise strengthens the muscles that rotate the leg at the hip and move it away from your body (hip abductors). 1. Lie on your side with your left / right leg in the top position. Lie so your head, shoulder, knee, and hip line up. You may bend your bottom knee to help you keep your balance. 2. Roll your hips slightly forward so your hips are stacked directly over each other and your left / right knee is facing forward. 3. Leading with your heel, lift your top leg 4-6 inches (10-15 cm). You should feel the muscles in your outer hip lifting. ? Do not let your foot drift forward. ? Do not let your  knee roll toward the ceiling. 4. Hold this position for __________ seconds. 5. Slowly return your leg to the starting position. 6. Let your muscles relax completely after each repetition. Repeat __________ times. Complete this exercise __________ times a day.   Straight leg raises This exercise stretches the muscles that move your hips away from the front of the pelvis (hip extensors). 1. Lie on your abdomen on a firm surface. You can put a pillow under your hips if that is more comfortable. 2. Tense the muscles in your buttocks and lift your left / right leg about 4-6 inches (10-15 cm). Keep your knee straight as you lift your leg. 3. Hold this position for __________ seconds. 4. Slowly lower your leg to the starting position. 5. Let your leg relax completely after each repetition. Repeat __________ times. Complete this exercise __________ times a day. This information is not intended to replace advice given to you by your health care provider. Make sure you discuss any questions you have with your health care provider. Document Revised: 09/06/2018 Document Reviewed: 09/06/2018 Elsevier Patient Education  2021 [employer]  . Standing Labs We placed an order today for your standing lab work.   Please have your standing labs drawn in June and every 3 months  If possible, please have your labs drawn 2 weeks prior to your appointment so that the provider can discuss your results at your appointment.  We have open lab daily Monday through Thursday from 1:30-4:30 PM and Friday from 1:30-4:00 PM at the office of Dr. Pollyann Savoy, [facility].   Please be advised, all patients with office appointments requiring lab work will take precedents over walk-in lab work.  If possible, please come for your lab work on Monday and Friday afternoons, as you may experience shorter wait times. The office is located at [address], [address], [place], [place] [address] No appointment  is necessary.   Labs are drawn by [facility]. Please bring your co-pay at the time of your lab draw.  You may receive a bill from [facility] for your lab work.  If you wish to have your labs drawn at another location, please call the office 24 hours in advance to send orders.  If you have any questions regarding directions or hours of operation,  please call [phone].   As a reminder, please drink plenty of water prior to coming for your lab work. Thanks!  COVID-19 vaccine recommendations:  COVID-19 vaccine is recommended for everyone (unless you are allergic to a vaccine component), even if you are on a medication that suppresses your immune system.   Conditions are that the individuals on immunosuppressive medications should receive first 3 COVID-19 doses 1 month apart and then a fourth dose (booster) 3 months to 6 months after the third dose  Do not take Tylenol or any anti-inflammatory medications (NSAIDs) 24 hours prior to the COVID-19 vaccination.   There is no direct evidence about the efficacy of the COVID-19 vaccine in individuals who are on medications that suppress the immune system.   Even if you are fully vaccinated, and you are on any medications that suppress your immune system, please continue to wear a mask, maintain at least six feet social distance and practice hand hygiene.   If you develop a COVID-19 infection, please contact your PCP or our office to determine if you need monoclonal antibody infusion.  The booster vaccine is now available for immunocompromised patients.   Please see the following web sites for updated information.   [email or web address]/[email or web address]-Patient-Resources.[email or web address]   Heart Disease Prevention   Your inflammatory disease increases your risk of heart disease which includes heart attack, stroke, atrial fibrillation (irregular heartbeats), high blood pressure, heart failure and atherosclerosis (plaque in the arteries).   It is important to reduce your risk by:   . Keep blood pressure, cholesterol, and blood sugar at healthy levels   . Smoking Cessation   . Maintain a healthy weight  o BMI 20-25   . Eat a healthy diet  o Plenty of fresh fruit, vegetables, and whole grains  o Limit saturated fats, foods high in sodium, and added sugars  o DASH and Mediterranean diet   . Increase physical activity  o Recommend moderate physically activity for 150 minutes per week/ 30 minutes a day for five days a week These can be broken up into three separate ten-minute sessions during the day.   . Reduce Stress  . Meditation, slow breathing exercises, yoga, coloring books  . Dental visits twice a year

## 2021-02-19 ENCOUNTER — Encounter: Payer: Self-pay | Admitting: Radiology

## 2021-02-19 ENCOUNTER — Other Ambulatory Visit: Payer: Self-pay | Admitting: *Deleted

## 2021-02-19 DIAGNOSIS — E559 Vitamin D deficiency, unspecified: Secondary | ICD-10-CM

## 2021-02-19 MED ORDER — VITAMIN D (ERGOCALCIFEROL) 1.25 MG (50000 UNIT) PO CAPS: 50000.0000 [IU] | ORAL_CAPSULE | ORAL | 0 refills | Status: DC

## 2021-02-19 MED ORDER — CLOBETASOL PROPIONATE 0.05 % EX SOLN: 1.0000 "application " | Freq: Two times a day (BID) | CUTANEOUS | 0 refills | Status: DC

## 2021-02-19 NOTE — Addendum Note (Signed)
Addended by: Gearldine Bienenstock on: 02/19/2021 11:21 AM   Modules accepted: Orders

## 2021-02-19 NOTE — Progress Notes (Signed)
CBC is normal.  ALT is 40 which was 42 before, it is a stable.  Vitamin D is low.  Advise vitamin D 50,000 units once a week for 3 months.  After that patient should take vitamin D 2000 units daily.  Repeat vitamin D in 3 months with next labs

## 2021-02-19 NOTE — Addendum Note (Signed)
Addended by: Audrie Lia on: 02/19/2021 10:07 AM   Modules accepted: Orders

## 2021-02-19 NOTE — Addendum Note (Signed)
Addended by: Audrie Lia on: 02/19/2021 09:51 AM   Modules accepted: Orders

## 2021-02-20 LAB — COMPLETE METABOLIC PANEL WITH GFR
AG Ratio: 1.4 (calc) (ref 1.0–2.5)
ALT: 40 U/L — ABNORMAL HIGH (ref 6–29)
AST: 23 U/L (ref 10–35)
Albumin: 4.2 g/dL (ref 3.6–5.1)
Alkaline phosphatase (APISO): 87 U/L (ref 37–153)
BUN: 10 mg/dL (ref 7–25)
CO2: 29 mmol/L (ref 20–32)
Calcium: 9.9 mg/dL (ref 8.6–10.4)
Chloride: 104 mmol/L (ref 98–110)
Creat: 0.68 mg/dL (ref 0.50–1.05)
GFR, Est African American: 114 mL/min/{1.73_m2} (ref >=60)
GFR, Est Non African American: 98 mL/min/{1.73_m2} (ref >=60)
Globulin: 3 g/dL (calc) (ref 1.9–3.7)
Glucose, Bld: 81 mg/dL (ref 65–99)
Potassium: 4.7 mmol/L (ref 3.5–5.3)
Sodium: 139 mmol/L (ref 135–146)
Total Bilirubin: 0.5 mg/dL (ref 0.2–1.2)
Total Protein: 7.2 g/dL (ref 6.1–8.1)

## 2021-02-20 LAB — CBC WITH DIFFERENTIAL/PLATELET
Absolute Monocytes: 372 cells/uL (ref 200–950)
Basophils Absolute: 59 cells/uL (ref 0–200)
Basophils Relative: 1 %
Eosinophils Absolute: 401 cells/uL (ref 15–500)
Eosinophils Relative: 6.8 %
HCT: 40.2 % (ref 35.0–45.0)
Hemoglobin: 13.2 g/dL (ref 11.7–15.5)
Lymphs Abs: 2071 cells/uL (ref 850–3900)
MCH: 28.4 pg (ref 27.0–33.0)
MCHC: 32.8 g/dL (ref 32.0–36.0)
MCV: 86.5 fL (ref 80.0–100.0)
MPV: 12.3 fL (ref 7.5–12.5)
Monocytes Relative: 6.3 %
Neutro Abs: 2997 cells/uL (ref 1500–7800)
Neutrophils Relative %: 50.8 %
Platelets: 205 10*3/uL (ref 140–400)
RBC: 4.65 10*6/uL (ref 3.80–5.10)
RDW: 13 % (ref 11.0–15.0)
Total Lymphocyte: 35.1 %
WBC: 5.9 10*3/uL (ref 3.8–10.8)

## 2021-02-20 LAB — QUANTIFERON-TB GOLD PLUS
Mitogen-NIL: >10 IU/mL
NIL: 0.04 IU/mL
QuantiFERON-TB Gold Plus: NEGATIVE
TB1-NIL: <0 IU/mL
TB2-NIL: <0 IU/mL

## 2021-02-20 LAB — VITAMIN D 25 HYDROXY (VIT D DEFICIENCY, FRACTURES): Vit D, 25-Hydroxy: 27 ng/mL — ABNORMAL LOW (ref 30–100)

## 2021-02-28 ENCOUNTER — Telehealth: Payer: Self-pay | Admitting: Rheumatology

## 2021-02-28 NOTE — Telephone Encounter (Signed)
Patient calling in reference to a referral Dr. Corliss Skains was going to put in to a Nutritionist for her. Patient has not heard from anyone, and was checking on this. Please call to advise.

## 2021-02-28 NOTE — Telephone Encounter (Signed)
Spoke with patient, advised MyChart message was sent and it is best for her to contact [facility] directly at [phone].

## 2021-03-11 DIAGNOSIS — E039 Hypothyroidism, unspecified: Secondary | ICD-10-CM | POA: Diagnosis not present

## 2021-03-11 DIAGNOSIS — R5383 Other fatigue: Secondary | ICD-10-CM | POA: Diagnosis not present

## 2021-03-18 DIAGNOSIS — E559 Vitamin D deficiency, unspecified: Secondary | ICD-10-CM | POA: Diagnosis not present

## 2021-03-18 DIAGNOSIS — R5383 Other fatigue: Secondary | ICD-10-CM | POA: Diagnosis not present

## 2021-03-18 DIAGNOSIS — L659 Nonscarring hair loss, unspecified: Secondary | ICD-10-CM | POA: Diagnosis not present

## 2021-03-18 DIAGNOSIS — E039 Hypothyroidism, unspecified: Secondary | ICD-10-CM | POA: Diagnosis not present

## 2021-04-15 ENCOUNTER — Other Ambulatory Visit: Payer: Self-pay | Admitting: Rheumatology

## 2021-04-15 DIAGNOSIS — L4050 Arthropathic psoriasis, unspecified: Secondary | ICD-10-CM

## 2021-04-15 NOTE — Telephone Encounter (Signed)
Next Visit: 05/22/2021  Last Visit: 02/18/2021  Last Fill: 10/16/2020  DX: Psoriatic arthritis   Current Dose per office note 02/18/2021, Cosentyx 300 mg every 28 days  Labs: 02/18/2021, CBC is normal. ALT is 40 which was 42 before, it is a stable. Vitamin D is low. Advise vitamin D 50,000 units once a week for 3 months. After that patient should take vitamin D 2000 units daily. Repeat vitamin D in 3 months with next labs  TB Gold: 02/18/2021, negative  Okay to refill Cosentyx?

## 2021-04-16 DIAGNOSIS — E538 Deficiency of other specified B group vitamins: Secondary | ICD-10-CM | POA: Diagnosis not present

## 2021-04-21 DIAGNOSIS — E559 Vitamin D deficiency, unspecified: Secondary | ICD-10-CM | POA: Diagnosis not present

## 2021-04-21 DIAGNOSIS — E039 Hypothyroidism, unspecified: Secondary | ICD-10-CM | POA: Diagnosis not present

## 2021-04-21 DIAGNOSIS — E538 Deficiency of other specified B group vitamins: Secondary | ICD-10-CM | POA: Diagnosis not present

## 2021-04-21 DIAGNOSIS — D509 Iron deficiency anemia, unspecified: Secondary | ICD-10-CM | POA: Diagnosis not present

## 2021-04-23 DIAGNOSIS — Z1212 Encounter for screening for malignant neoplasm of rectum: Secondary | ICD-10-CM | POA: Diagnosis not present

## 2021-04-23 DIAGNOSIS — R82998 Other abnormal findings in urine: Secondary | ICD-10-CM | POA: Diagnosis not present

## 2021-04-23 DIAGNOSIS — Z0000 Encounter for general adult medical examination without abnormal findings: Secondary | ICD-10-CM | POA: Diagnosis not present

## 2021-04-23 DIAGNOSIS — E039 Hypothyroidism, unspecified: Secondary | ICD-10-CM | POA: Diagnosis not present

## 2021-04-23 DIAGNOSIS — Z23 Encounter for immunization: Secondary | ICD-10-CM | POA: Diagnosis not present

## 2021-04-23 DIAGNOSIS — E538 Deficiency of other specified B group vitamins: Secondary | ICD-10-CM | POA: Diagnosis not present

## 2021-05-02 NOTE — Progress Notes (Signed)
Office Visit Note  [name]             Date of Birth: 27-Oct-1966           MRN: [id number]             PCP: Rodrigo Ran, MD Referring: Rodrigo Ran, MD Visit Date: 05/16/2021 Occupation: @GUAROCC @  Subjective:  Joint stiffness and pain.   History of Present Illness: Wendy [name] is a 55 y.o. female with history of psoriatic arthritis and psoriasis.  She states she has been taking Cosentyx on a regular basis.  She states recently she has been experiencing some cramps in her left hand when she is holding objects.  She denies any trigger finger.  She has not noticed any joint swelling.  Although she does have some stiffness.  She has been exercising on a regular basis.  She continues to have some discomfort in her knee joints and her SI joints.  Activities of Daily Living:  Patient reports morning stiffness for 10-15 minutes.   Patient Denies nocturnal pain.  Difficulty dressing/grooming: Reports Difficulty climbing stairs: Reports Difficulty getting out of chair: Reports Difficulty using hands for taps, buttons, cutlery, and/or writing: Reports  Review of Systems  Constitutional:  Positive for fatigue.  HENT:  Positive for mouth dryness. Negative for mouth sores and nose dryness.   Eyes:  Negative for pain, itching and dryness.  Respiratory:  Negative for shortness of breath and difficulty breathing.   Cardiovascular:  Negative for chest pain and palpitations.  Gastrointestinal:  Negative for blood in stool, constipation and diarrhea.  Endocrine: Negative for increased urination.  Genitourinary:  Negative for difficulty urinating.  Musculoskeletal:  Positive for joint pain, joint pain, joint swelling, myalgias, morning stiffness, muscle tenderness and myalgias.  Skin:  Negative for color change, rash and redness.  Allergic/Immunologic: Positive for susceptible to infections.  Neurological:  Positive for numbness, headaches, parasthesias and memory loss. Negative  for dizziness and weakness.  Hematological:  Negative for bruising/bleeding tendency.  Psychiatric/Behavioral:  Negative for confusion.    PMFS History:  Patient Active Problem List   Diagnosis Date Noted   Nonallopathic lesion of lumbosacral region 11/04/2018   Nonallopathic lesion of sacral region 11/04/2018   Nonallopathic lesion of thoracic region 11/04/2018   Psoriasis 07/05/2017   DJD (degenerative joint disease), cervical 02/03/2017   Spondylosis of lumbar region without myelopathy or radiculopathy 02/03/2017   High risk medication use 10/06/2016   Plantar fasciitis 10/06/2016   Achilles tendinitis of both lower extremities 10/06/2016   Vitamin D deficiency 10/06/2016   Primary osteoarthritis of both knees 10/06/2016   Primary osteoarthritis of both feet 10/06/2016   Chronic low back pain without sciatica 10/06/2016   Hypothyroidism 10/06/2016   Polyp of colon 10/06/2016   B12 deficiency 10/06/2016   Psoriatic arthropathy (HCC) 04/11/2012   Anal fissure 04/11/2012    Past Medical History:  Diagnosis Date   Anal fissure 2004   Tx'd with diltiazem  gel by Dr. 2005   Arthritis    Bronchitis, acute 2008   Seasonal allergies    Thyroid disease    URI 10/12/2007   Qualifier: Diagnosis of  By: 13/10/2007 MD, Alwyn Ren History  Problem Relation Age of Onset   Hypertension Mother    Diabetes Mother    Liver disease Sister        liver transplant    Cirrhosis Sister  non-alcoholic    Past Surgical History:  Procedure Laterality Date   BREAST SURGERY  12/15/2017   breast reduction    Social History   Social History Narrative   Not on file   Immunization History  Administered Date(s) Administered   Moderna Sars-Covid-2 Vaccination 12/29/2019, 01/24/2020, 12/05/2020   Pneumococcal Polysaccharide-23 10/28/2010   Zoster Recombinat (Shingrix) 11/16/2018, 01/30/2019     Objective: Vital Signs: BP 133/84 (BP Location: Left Arm, Patient Position:  Sitting, Cuff Size: Normal)   Pulse 62   Resp 15   Ht 5\' 2"  (1.575 m)   Wt 158 lb 9.6 oz (71.9 kg)   BMI 29.01 kg/m    Physical Exam Vitals and nursing note reviewed.  Constitutional:      Appearance: She is well-developed.  HENT:     Head: Normocephalic and atraumatic.  Eyes:     Conjunctiva/sclera: Conjunctivae normal.  Cardiovascular:     Rate and Rhythm: Normal rate and regular rhythm.     Heart sounds: Normal heart sounds.  Pulmonary:     Effort: Pulmonary effort is normal.     Breath sounds: Normal breath sounds.  Abdominal:     General: Bowel sounds are normal.     Palpations: Abdomen is soft.  Musculoskeletal:     Cervical back: Normal range of motion.  Lymphadenopathy:     Cervical: No cervical adenopathy.  Skin:    General: Skin is warm and dry.     Capillary Refill: Capillary refill takes less than 2 seconds.  Neurological:     Mental Status: She is alert and oriented to person, place, and time.  Psychiatric:        Behavior: Behavior normal.     Musculoskeletal Exam: C-spine, thoracic and lumbar spine were in good range of motion.  She had tenderness on palpation of bilateral SI joints.  Shoulder joints, elbow joints, wrist joints, MCPs PIPs and DIPs with good range of motion with no synovitis.  Hip joints, knee joints, ankles, MTPs and PIPs with good range of motion with no synovitis.  CDAI Exam: CDAI Score: -- Patient Global: --; Provider Global: -- Swollen: --; Tender: -- Joint Exam 05/16/2021   No joint exam has been documented for this visit   There is currently no information documented on the homunculus. Go to the Rheumatology activity and complete the homunculus joint exam.  Investigation: No additional findings.  Imaging: No results found.  Recent Labs: Lab Results  Component Value Date   WBC 5.9 02/18/2021   HGB 13.2 02/18/2021   PLT 205 02/18/2021   NA 139 02/18/2021   K 4.7 02/18/2021   CL 104 02/18/2021   CO2 29 02/18/2021    GLUCOSE 81 02/18/2021   BUN 10 02/18/2021   CREATININE 0.68 02/18/2021   BILITOT 0.5 02/18/2021   ALKPHOS 77 05/27/2017   AST 23 02/18/2021   ALT 40 (H) 02/18/2021   PROT 7.2 02/18/2021   ALBUMIN 3.9 05/27/2017   CALCIUM 9.9 02/18/2021   GFRAA 114 02/18/2021   QFTBGOLDPLUS NEGATIVE 02/18/2021    Speciality Comments: Prior therapy:  failed Humira, Enbrel, Otezla, intolerance to oral methotrexate and Arava Otrexup x1 dose and then discontinued  Procedures:  No procedures performed Allergies: Pollen extract, Arava [leflunomide], and Methotrexate derivatives   Assessment / Plan:     Visit Diagnoses: Psoriatic arthritis (HCC)-she complains of increased pain and stiffness.  No synovitis was noted.  I will check sed rate today.  Psoriasis-she has no active psoriasis lesions.  She had dry skin on her hands.  High risk medication use - Cosentyx 300 mg every 28 days.  I will obtain CBC with differential and CMP with GFR today.  Her labs from March 2022 showed ALT elevation of 40.  If her ALT stays elevated I will refer her to GI.  Sacroiliac pain-she continues to have some SI joint discomfort.  Primary osteoarthritis of both knees-she complains of discomfort in her bilateral knee joints.  She has been exercising.  No warmth swelling or effusion was noted.  Primary osteoarthritis of both feet-she has discomfort in her feet off and on.  She has not had any recent flares of plantar fasciitis.  DDD (degenerative disc disease), cervical-she has a stiffness in her cervical region and trapezius muscles.  She takes muscle relaxers on as needed basis.  DDD (degenerative disc disease), lumbar-she continues to have some lower back pain.  Elevated LFTs - LFTs have been mildly elevated. Her sister was recently diagnosed with cirrhosis due to fatty liver and underwent liver transplant.  Myalgia-she continues to have some generalized myalgias.  Muscle relaxers are helpful which she uses on as needed  basis.  History of hypothyroidism  Vitamin D deficiency-she has intolerance to vitamin D 50,000 units.  Advised to take vitamin D 5000 units daily.  We will check vitamin D levels with next labs.  Orders: Orders Placed This Encounter  Procedures   CBC with Differential/Platelet   COMPLETE METABOLIC PANEL WITH GFR   Sedimentation rate    No orders of the defined types were placed in this encounter.    Follow-Up Instructions: Return in about 3 months (around 08/16/2021) for Psoriatic arthritis.   Pollyann Savoy, MD  Note - This record has been created using Animal nutritionist.  Chart creation errors have been sought, but may not always  have been located. Such creation errors do not reflect on  the standard of medical care.

## 2021-05-08 ENCOUNTER — Telehealth: Payer: Self-pay

## 2021-05-08 NOTE — Telephone Encounter (Signed)
Patient called requesting prescription refill of Cosentyx to be sent to [facility].

## 2021-05-08 NOTE — Telephone Encounter (Signed)
Attempted to contact the patient and left message to advise patient her prescription was sent to the pharmacy on 04/15/2021. Advised patient to call the pharmacy to set up shipment and to contact the office if she had any questions or concerns.

## 2021-05-12 DIAGNOSIS — E538 Deficiency of other specified B group vitamins: Secondary | ICD-10-CM | POA: Diagnosis not present

## 2021-05-12 DIAGNOSIS — E039 Hypothyroidism, unspecified: Secondary | ICD-10-CM | POA: Diagnosis not present

## 2021-05-12 DIAGNOSIS — E559 Vitamin D deficiency, unspecified: Secondary | ICD-10-CM | POA: Diagnosis not present

## 2021-05-13 DIAGNOSIS — Z20822 Contact with and (suspected) exposure to covid-19: Secondary | ICD-10-CM | POA: Diagnosis not present

## 2021-05-16 ENCOUNTER — Ambulatory Visit (INDEPENDENT_AMBULATORY_CARE_PROVIDER_SITE_OTHER): Payer: BC Managed Care – PPO | Admitting: Rheumatology

## 2021-05-16 ENCOUNTER — Encounter: Payer: Self-pay | Admitting: Rheumatology

## 2021-05-16 ENCOUNTER — Other Ambulatory Visit: Payer: Self-pay

## 2021-05-16 VITALS — BP 133/84 | HR 62 | Resp 15 | Ht 62.0 in | Wt 158.6 lb

## 2021-05-16 DIAGNOSIS — L4050 Arthropathic psoriasis, unspecified: Secondary | ICD-10-CM | POA: Diagnosis not present

## 2021-05-16 DIAGNOSIS — M791 Myalgia, unspecified site: Secondary | ICD-10-CM

## 2021-05-16 DIAGNOSIS — M533 Sacrococcygeal disorders, not elsewhere classified: Secondary | ICD-10-CM

## 2021-05-16 DIAGNOSIS — L409 Psoriasis, unspecified: Secondary | ICD-10-CM

## 2021-05-16 DIAGNOSIS — Z79899 Other long term (current) drug therapy: Secondary | ICD-10-CM | POA: Diagnosis not present

## 2021-05-16 DIAGNOSIS — M19072 Primary osteoarthritis, left ankle and foot: Secondary | ICD-10-CM

## 2021-05-16 DIAGNOSIS — R7989 Other specified abnormal findings of blood chemistry: Secondary | ICD-10-CM

## 2021-05-16 DIAGNOSIS — M19071 Primary osteoarthritis, right ankle and foot: Secondary | ICD-10-CM

## 2021-05-16 DIAGNOSIS — M5136 Other intervertebral disc degeneration, lumbar region: Secondary | ICD-10-CM

## 2021-05-16 DIAGNOSIS — Z8639 Personal history of other endocrine, nutritional and metabolic disease: Secondary | ICD-10-CM

## 2021-05-16 DIAGNOSIS — E559 Vitamin D deficiency, unspecified: Secondary | ICD-10-CM

## 2021-05-16 DIAGNOSIS — M5030 Other cervical disc degeneration, unspecified cervical region: Secondary | ICD-10-CM

## 2021-05-16 DIAGNOSIS — M170 Bilateral primary osteoarthritis of knee: Secondary | ICD-10-CM

## 2021-05-16 NOTE — Patient Instructions (Signed)
Standing Labs We placed an order today for your standing lab work.   Please have your standing labs drawn in September and every 3 months  If possible, please have your labs drawn 2 weeks prior to your appointment so that the provider can discuss your results at your appointment.  Please note that you may see your imaging and lab results in MyChart before we have reviewed them. We may be awaiting multiple results to interpret others before contacting you. Please allow our office up to 72 hours to thoroughly review all of the results before contacting the office for clarification of your results.  We have open lab daily: Monday through Thursday from 1:30-4:30 PM and Friday from 1:30-4:00 PM at the office of Dr. Lakelynn Severtson, [facility].   Please be advised, all patients with office appointments requiring lab work will take precedent over walk-in lab work.  If possible, please come for your lab work on Monday and Friday afternoons, as you may experience shorter wait times. The office is located at [address], [address], [place], [place] [address] No appointment is necessary.   Labs are drawn by [facility]. Please bring your co-pay at the time of your lab draw.  You may receive a bill from [facility] for your lab work.  If you wish to have your labs drawn at another location, please call the office 24 hours in advance to send orders.  If you have any questions regarding directions or hours of operation,  please call [phone].   As a reminder, please drink plenty of water prior to coming for your lab work. Thanks! 

## 2021-05-17 LAB — COMPLETE METABOLIC PANEL WITH GFR
AG Ratio: 1.3 (calc) (ref 1.0–2.5)
ALT: 38 U/L — ABNORMAL HIGH (ref 6–29)
AST: 22 U/L (ref 10–35)
Albumin: 4.4 g/dL (ref 3.6–5.1)
Alkaline phosphatase (APISO): 94 U/L (ref 37–153)
BUN: 11 mg/dL (ref 7–25)
CO2: 29 mmol/L (ref 20–32)
Calcium: 9.5 mg/dL (ref 8.6–10.4)
Chloride: 103 mmol/L (ref 98–110)
Creat: 0.86 mg/dL (ref 0.50–1.05)
GFR, Est African American: 88 mL/min/{1.73_m2} (ref >=60)
GFR, Est Non African American: 76 mL/min/{1.73_m2} (ref >=60)
Globulin: 3.3 g/dL (calc) (ref 1.9–3.7)
Glucose, Bld: 89 mg/dL (ref 65–99)
Potassium: 4.5 mmol/L (ref 3.5–5.3)
Sodium: 138 mmol/L (ref 135–146)
Total Bilirubin: 0.6 mg/dL (ref 0.2–1.2)
Total Protein: 7.7 g/dL (ref 6.1–8.1)

## 2021-05-17 LAB — SEDIMENTATION RATE: Sed Rate: 9 mm/h (ref 0–30)

## 2021-05-17 LAB — CBC WITH DIFFERENTIAL/PLATELET
Absolute Monocytes: 441 cells/uL (ref 200–950)
Basophils Absolute: 98 cells/uL (ref 0–200)
Basophils Relative: 1.4 %
Eosinophils Absolute: 595 cells/uL — ABNORMAL HIGH (ref 15–500)
Eosinophils Relative: 8.5 %
HCT: 40.6 % (ref 35.0–45.0)
Hemoglobin: 13.4 g/dL (ref 11.7–15.5)
Lymphs Abs: 2254 cells/uL (ref 850–3900)
MCH: 28.2 pg (ref 27.0–33.0)
MCHC: 33 g/dL (ref 32.0–36.0)
MCV: 85.5 fL (ref 80.0–100.0)
MPV: 12.6 fL — ABNORMAL HIGH (ref 7.5–12.5)
Monocytes Relative: 6.3 %
Neutro Abs: 3612 cells/uL (ref 1500–7800)
Neutrophils Relative %: 51.6 %
Platelets: 203 10*3/uL (ref 140–400)
RBC: 4.75 10*6/uL (ref 3.80–5.10)
RDW: 13.3 % (ref 11.0–15.0)
Total Lymphocyte: 32.2 %
WBC: 7 10*3/uL (ref 3.8–10.8)

## 2021-05-19 DIAGNOSIS — L659 Nonscarring hair loss, unspecified: Secondary | ICD-10-CM | POA: Diagnosis not present

## 2021-05-19 DIAGNOSIS — E559 Vitamin D deficiency, unspecified: Secondary | ICD-10-CM | POA: Diagnosis not present

## 2021-05-19 DIAGNOSIS — E039 Hypothyroidism, unspecified: Secondary | ICD-10-CM | POA: Diagnosis not present

## 2021-05-19 DIAGNOSIS — R5383 Other fatigue: Secondary | ICD-10-CM | POA: Diagnosis not present

## 2021-05-19 NOTE — Progress Notes (Signed)
CBC is normal, ALT is 38 which is stable.  Sed rate is 9.  I discussed results with the patient.  I contacted Dr. Kenna Gilbert office.  She is a previous patient of Dr. Loreta Ave.  They will schedule ultrasound of her liver to evaluate this further.  Please forward labs to Dr. Loreta Ave.

## 2021-05-22 ENCOUNTER — Ambulatory Visit: Payer: BC Managed Care – PPO | Admitting: Rheumatology

## 2021-05-27 DIAGNOSIS — R748 Abnormal levels of other serum enzymes: Secondary | ICD-10-CM | POA: Diagnosis not present

## 2021-05-27 DIAGNOSIS — E669 Obesity, unspecified: Secondary | ICD-10-CM | POA: Diagnosis not present

## 2021-05-27 DIAGNOSIS — K219 Gastro-esophageal reflux disease without esophagitis: Secondary | ICD-10-CM | POA: Diagnosis not present

## 2021-05-28 ENCOUNTER — Other Ambulatory Visit: Payer: Self-pay | Admitting: Gastroenterology

## 2021-05-28 DIAGNOSIS — R7989 Other specified abnormal findings of blood chemistry: Secondary | ICD-10-CM

## 2021-05-29 DIAGNOSIS — E538 Deficiency of other specified B group vitamins: Secondary | ICD-10-CM | POA: Diagnosis not present

## 2021-06-13 ENCOUNTER — Ambulatory Visit
Admission: RE | Admit: 2021-06-13 | Discharge: 2021-06-13 | Disposition: A | Discharge status: Home / Self Care | Payer: BC Managed Care – PPO | Source: Ambulatory Visit | Attending: Gastroenterology | Admitting: Gastroenterology

## 2021-06-13 DIAGNOSIS — R945 Abnormal results of liver function studies: Secondary | ICD-10-CM | POA: Diagnosis not present

## 2021-06-13 DIAGNOSIS — R7989 Other specified abnormal findings of blood chemistry: Secondary | ICD-10-CM

## 2021-06-14 DIAGNOSIS — Z20822 Contact with and (suspected) exposure to covid-19: Secondary | ICD-10-CM | POA: Diagnosis not present

## 2021-06-14 DIAGNOSIS — Z03818 Encounter for observation for suspected exposure to other biological agents ruled out: Secondary | ICD-10-CM | POA: Diagnosis not present

## 2021-07-15 DIAGNOSIS — J301 Allergic rhinitis due to pollen: Secondary | ICD-10-CM | POA: Diagnosis not present

## 2021-07-15 DIAGNOSIS — T783XXA Angioneurotic edema, initial encounter: Secondary | ICD-10-CM | POA: Diagnosis not present

## 2021-07-15 DIAGNOSIS — J3081 Allergic rhinitis due to animal (cat) (dog) hair and dander: Secondary | ICD-10-CM | POA: Diagnosis not present

## 2021-07-15 DIAGNOSIS — J3089 Other allergic rhinitis: Secondary | ICD-10-CM | POA: Diagnosis not present

## 2021-08-05 NOTE — Progress Notes (Signed)
Office Visit Note  [name]             Date of Birth: Jun 13, 1966           MRN: [id number]             PCP: Rodrigo Ran, MD Referring: Rodrigo Ran, MD Visit Date: 08/19/2021 Occupation: @GUAROCC @  Subjective:  Medication management.   History of Present Illness: Wendy [name] is a 55 y.o. female with a history of psoriatic arthritis and psoriasis.  She states she has been doing quite well.  She just returned from [place] and is feeling better.  She has some joint stiffness but no joint pain.  She denies any history of joint swelling.  She denies any recent episodes of plantar fasciitis or Achilles cellulitis.  She has not had any psoriasis lesions.  Activities of Daily Living:  Patient reports morning stiffness for 10-15 minutes.   Patient Reports nocturnal pain.  Difficulty dressing/grooming: Denies Difficulty climbing stairs: Reports Difficulty getting out of chair: Denies Difficulty using hands for taps, buttons, cutlery, and/or writing: Reports  Review of Systems  Constitutional:  Positive for fatigue.  HENT:  Negative for mouth sores, mouth dryness and nose dryness.   Eyes:  Negative for pain, itching and dryness.  Respiratory:  Negative for shortness of breath and difficulty breathing.   Cardiovascular:  Negative for chest pain and palpitations.  Gastrointestinal:  Negative for blood in stool, constipation and diarrhea.  Endocrine: Negative for increased urination.  Genitourinary:  Negative for difficulty urinating.  Musculoskeletal:  Positive for myalgias, morning stiffness, muscle tenderness and myalgias. Negative for joint pain, joint pain and joint swelling.  Skin:  Negative for color change, rash, redness and sensitivity to sunlight.  Allergic/Immunologic: Positive for susceptible to infections.  Neurological:  Negative for dizziness, numbness, headaches, memory loss and weakness.  Hematological:  Positive for bruising/bleeding tendency.   Psychiatric/Behavioral:  Negative for confusion.    PMFS History:  Patient Active Problem List   Diagnosis Date Noted   Nonallopathic lesion of lumbosacral region 11/04/2018   Nonallopathic lesion of sacral region 11/04/2018   Nonallopathic lesion of thoracic region 11/04/2018   Psoriasis 07/05/2017   DJD (degenerative joint disease), cervical 02/03/2017   Spondylosis of lumbar region without myelopathy or radiculopathy 02/03/2017   High risk medication use 10/06/2016   Plantar fasciitis 10/06/2016   Achilles tendinitis of both lower extremities 10/06/2016   Vitamin D deficiency 10/06/2016   Primary osteoarthritis of both knees 10/06/2016   Primary osteoarthritis of both feet 10/06/2016   Chronic low back pain without sciatica 10/06/2016   Hypothyroidism 10/06/2016   Polyp of colon 10/06/2016   B12 deficiency 10/06/2016   Psoriatic arthropathy (HCC) 04/11/2012   Anal fissure 04/11/2012    Past Medical History:  Diagnosis Date   Anal fissure 2004   Tx'd with diltiazem  gel by Dr. 2005   Arthritis    Bronchitis, acute 2008   Seasonal allergies    Thyroid disease    URI 10/12/2007   Qualifier: Diagnosis of  By: 13/10/2007 MD, Alwyn Ren History  Problem Relation Age of Onset   Hypertension Mother    Diabetes Mother    Liver disease Sister        liver transplant    Cirrhosis Sister        non-alcoholic    Past Surgical History:  Procedure Laterality Date   BREAST SURGERY  12/15/2017   breast  reduction    Social History   Social History Narrative   Not on file   Immunization History  Administered Date(s) Administered   Moderna Sars-Covid-2 Vaccination 12/29/2019, 01/24/2020, 12/05/2020   Pneumococcal Polysaccharide-23 10/28/2010   Zoster Recombinat (Shingrix) 11/16/2018, 01/30/2019     Objective: Vital Signs: BP 120/86 (BP Location: Left Arm, Patient Position: Sitting, Cuff Size: Normal)   Pulse 60   Ht 5\' 2"  (1.575 m)   Wt 158 lb 9.6 oz (71.9  kg)   BMI 29.01 kg/m    Physical Exam Vitals and nursing note reviewed.  Constitutional:      Appearance: She is well-developed.  HENT:     Head: Normocephalic and atraumatic.  Eyes:     Conjunctiva/sclera: Conjunctivae normal.  Cardiovascular:     Rate and Rhythm: Normal rate and regular rhythm.     Heart sounds: Normal heart sounds.  Pulmonary:     Effort: Pulmonary effort is normal.     Breath sounds: Normal breath sounds.  Abdominal:     General: Bowel sounds are normal.     Palpations: Abdomen is soft.  Musculoskeletal:     Cervical back: Normal range of motion.  Lymphadenopathy:     Cervical: No cervical adenopathy.  Skin:    General: Skin is warm and dry.     Capillary Refill: Capillary refill takes less than 2 seconds.  Neurological:     Mental Status: She is alert and oriented to person, place, and time.  Psychiatric:        Behavior: Behavior normal.     Musculoskeletal Exam: C-spine, thoracic and lumbar spine were in good range of motion.  She had mild discomfort in her SI joints.  Shoulder joints, elbow joints, wrist joints, MCPs PIPs and DIPs with good range of motion with no synovitis.  Hip joints, knee joints, ankles, MTPs and PIPs with good range of motion with no synovitis.  CDAI Exam: CDAI Score: -- Patient Global: --; Provider Global: -- Swollen: --; Tender: -- Joint Exam 08/19/2021   No joint exam has been documented for this visit   There is currently no information documented on the homunculus. Go to the Rheumatology activity and complete the homunculus joint exam.  Investigation: No additional findings.  Imaging: No results found.  Recent Labs: Lab Results  Component Value Date   WBC 7.0 05/16/2021   HGB 13.4 05/16/2021   PLT 203 05/16/2021   NA 138 05/16/2021   K 4.5 05/16/2021   CL 103 05/16/2021   CO2 29 05/16/2021   GLUCOSE 89 05/16/2021   BUN 11 05/16/2021   CREATININE 0.86 05/16/2021   BILITOT 0.6 05/16/2021   ALKPHOS 77  05/27/2017   AST 22 05/16/2021   ALT 38 (H) 05/16/2021   PROT 7.7 05/16/2021   ALBUMIN 3.9 05/27/2017   CALCIUM 9.5 05/16/2021   GFRAA 88 05/16/2021   QFTBGOLDPLUS NEGATIVE 02/18/2021    Speciality Comments: Prior therapy:  failed Humira, Enbrel, Otezla, intolerance to oral methotrexate and Arava Otrexup x1 dose and then discontinued  Procedures:  No procedures performed Allergies: Pollen extract, Arava [leflunomide], and Methotrexate derivatives   Assessment / Plan:     Visit Diagnoses: Psoriatic arthritis (HCC)-she has no active synovitis and is clinically doing well.  She has some stiffness in her SI joints off and on.  Psoriasis-she had no active psoriasis lesions.  High risk medication use - Cosentyx 300 mg every 28 days.  We will check labs today and then every 3 months  to monitor for drug toxicity.  TB gold was negative on February 18, 2021.  She has been advised to stop Cosentyx if she develops an infection and restart after the infection resolves.  Updated information regarding realization was also placed in the AVS.  Sacroiliac pain-she has some stiffness but not discomfort currently.  Primary osteoarthritis of both knees-she is doing better.  She denies any discomfort in her knee joints.  No warmth swelling or effusion was noted.  Primary osteoarthritis of both feet-she has had problems with plantar fasciitis in the past which is better now.  DDD (degenerative disc disease), cervical-she had good range of motion without discomfort.  DDD (degenerative disc disease), lumbar-she had good range of motion without discomfort.  Elevated LFTs-she was evaluated by Dr. Loreta Ave and was advised dietary modifications.  History of hypothyroidism  Vitamin D deficiency-vitamin D was 73 on February 18, 2021.  She has been taking vitamin D 50,000 units once a week.  She states she forgets to take her medication.  We discussed switching to vitamin D 2000 units daily after her labs if her vitamin  D is normal.  Orders: No orders of the defined types were placed in this encounter.  No orders of the defined types were placed in this encounter.    Follow-Up Instructions: Return in about 5 months (around 01/19/2022) for Psoriatic arthritis.   Pollyann Savoy, MD  Note - This record has been created using Animal nutritionist.  Chart creation errors have been sought, but may not always  have been located. Such creation errors do not reflect on  the standard of medical care.

## 2021-08-08 IMAGING — US US ABDOMEN LIMITED
1 series · 14 of 25 positions shown · non-contrast
Comparison: None.

CLINICAL DATA: Elevated liver function tests.

EXAM:
ULTRASOUND ABDOMEN LIMITED RIGHT UPPER QUADRANT

[Series 1: us abdomen limited · 0.23mm/px · 14 of 42 slices shown]
[im 1/42]
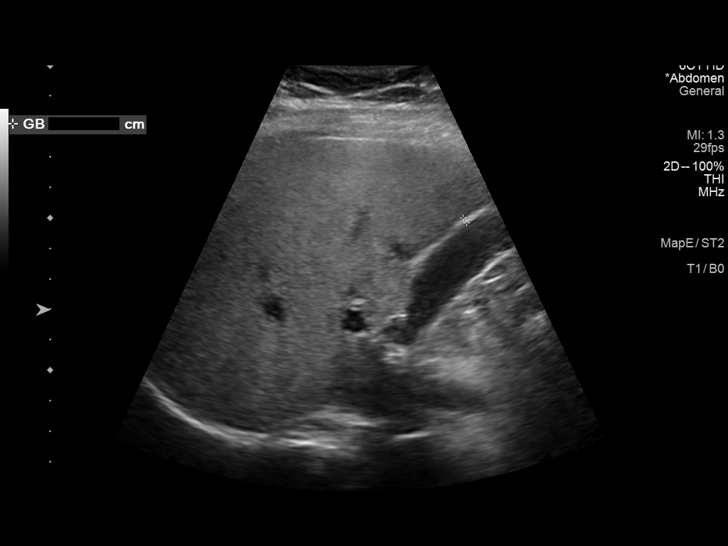
[im 4/42]
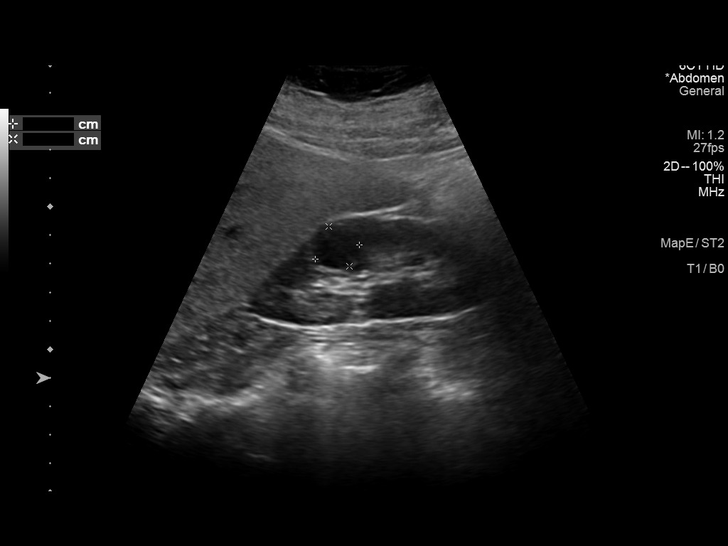
[im 7/42]
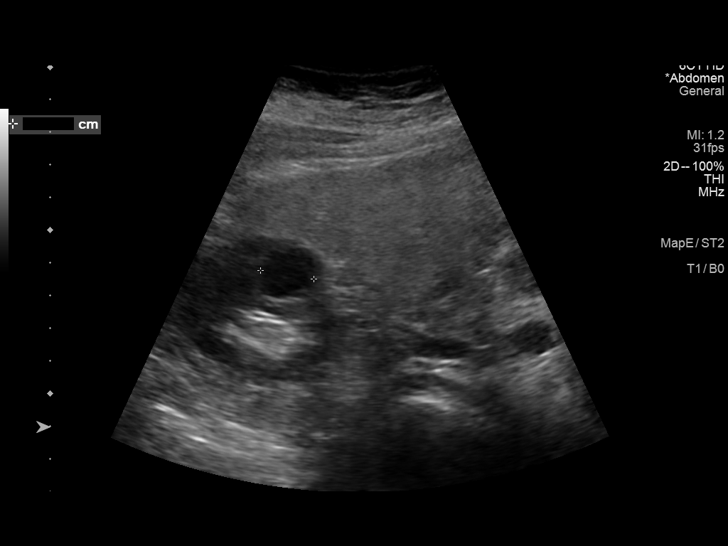
[im 11/42]
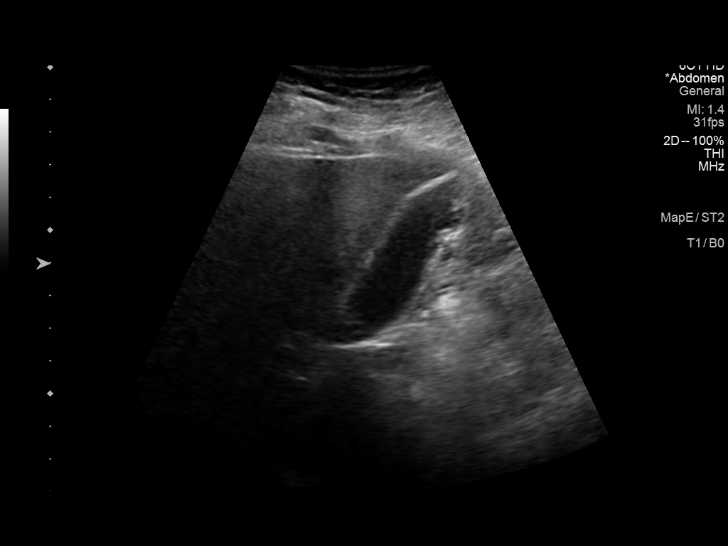
[im 14/42]
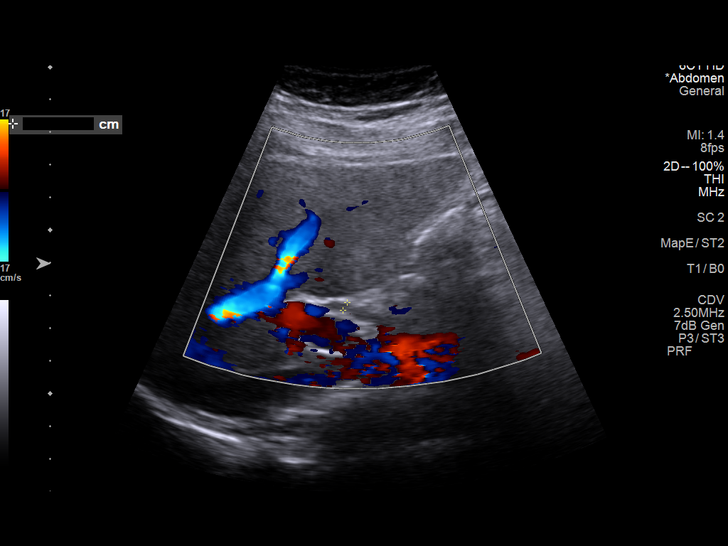
[im 16/42]
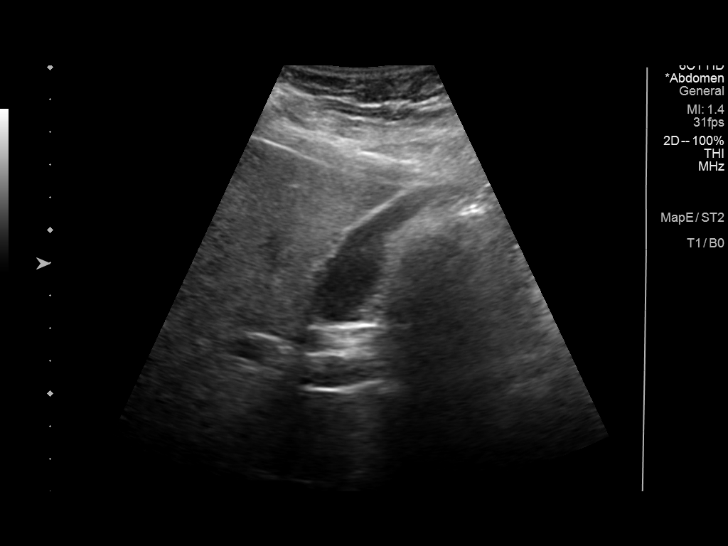
[im 19/42]
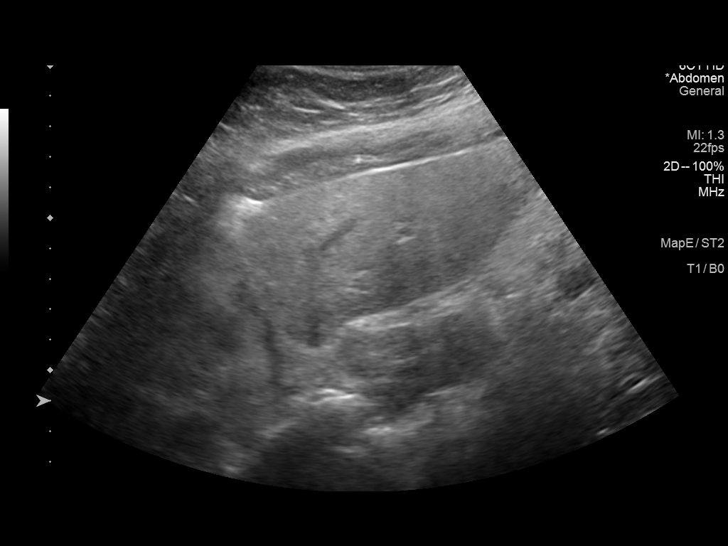
[im 23/42]
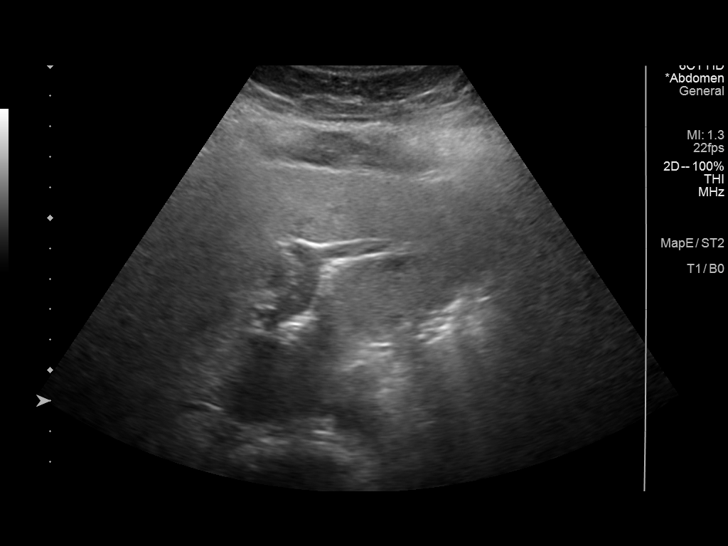
[im 26/42]
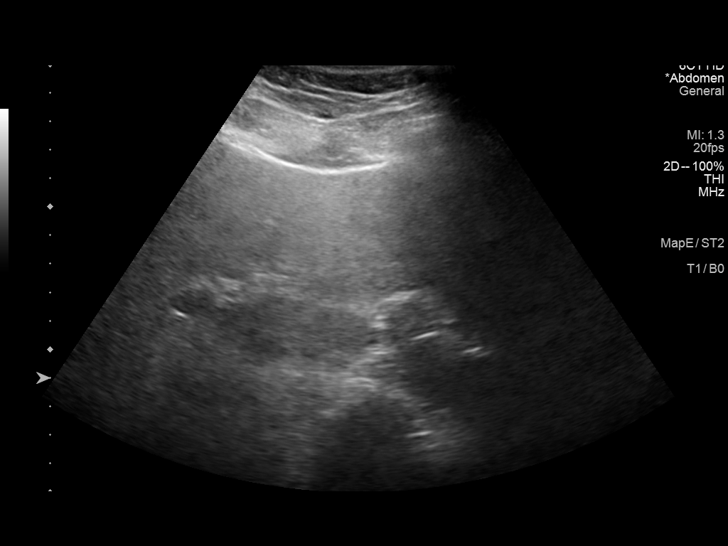
[im 28/42]
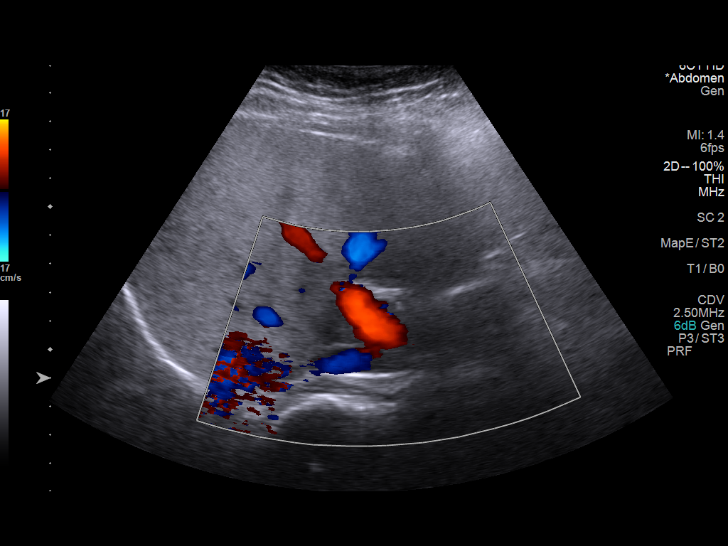
[im 31/42]
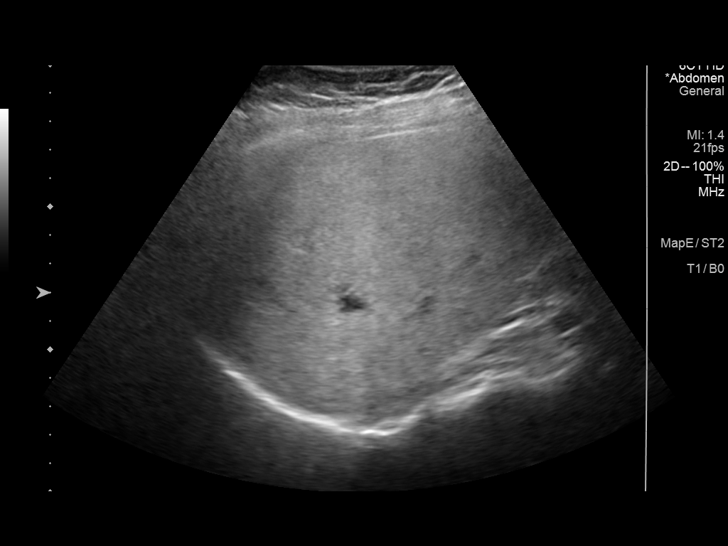
[im 35/42]
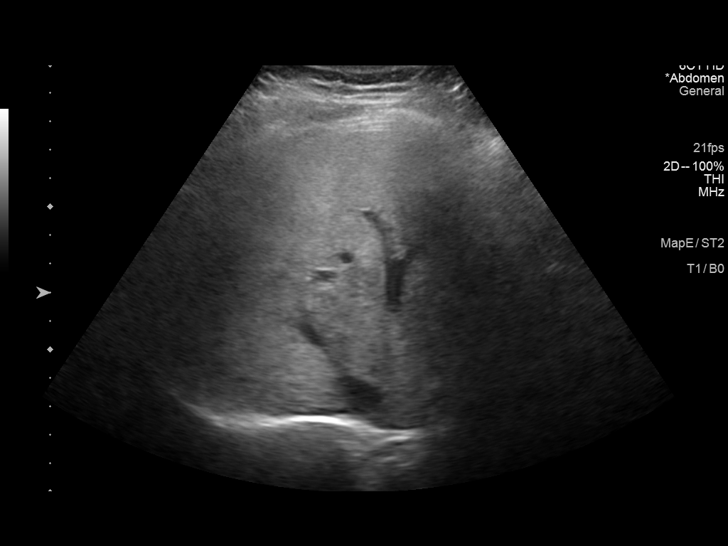
[im 38/42]
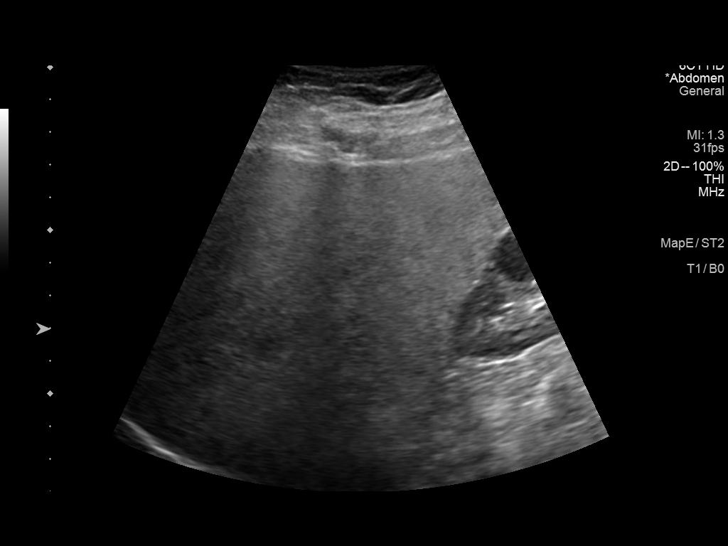
[im 42/42]
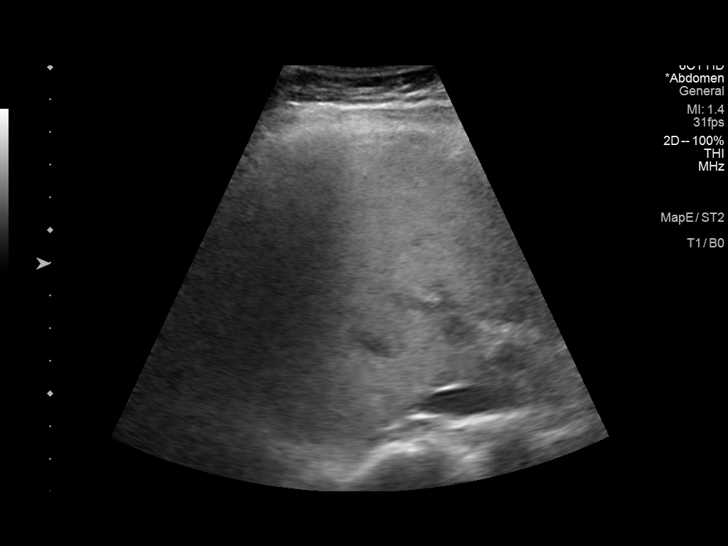

[14 of 25 positions shown; findings below may reference images not displayed]

FINDINGS: Gallbladder:

No gallstones or wall thickening visualized. No sonographic Murphy
sign noted by sonographer.

Common bile duct:

Diameter: 2.7 mm, within normal limits

Liver:

Liver is diffusely echogenic. No discrete lesions are seen. Internal
architecture is obscured. Portal vein is patent on color Doppler
imaging with normal direction of blood flow towards the liver.

Other: Incidental note made of 1.7 cm right renal cyst.
IMPRESSION: 1. Diffuse increased echogenicity of the liver suggesting fatty
infiltration.
2. Normal sonographic appearance of the gallbladder and common bile
duct.

## 2021-08-19 ENCOUNTER — Encounter: Payer: Self-pay | Admitting: Rheumatology

## 2021-08-19 ENCOUNTER — Ambulatory Visit: Payer: BC Managed Care – PPO | Admitting: Rheumatology

## 2021-08-19 ENCOUNTER — Other Ambulatory Visit: Payer: Self-pay

## 2021-08-19 VITALS — BP 120/86 | HR 60 | Ht 62.0 in | Wt 158.6 lb

## 2021-08-19 DIAGNOSIS — E559 Vitamin D deficiency, unspecified: Secondary | ICD-10-CM | POA: Diagnosis not present

## 2021-08-19 DIAGNOSIS — L409 Psoriasis, unspecified: Secondary | ICD-10-CM

## 2021-08-19 DIAGNOSIS — L4050 Arthropathic psoriasis, unspecified: Secondary | ICD-10-CM

## 2021-08-19 DIAGNOSIS — M533 Sacrococcygeal disorders, not elsewhere classified: Secondary | ICD-10-CM | POA: Diagnosis not present

## 2021-08-19 DIAGNOSIS — M170 Bilateral primary osteoarthritis of knee: Secondary | ICD-10-CM

## 2021-08-19 DIAGNOSIS — Z79899 Other long term (current) drug therapy: Secondary | ICD-10-CM

## 2021-08-19 DIAGNOSIS — M5136 Other intervertebral disc degeneration, lumbar region: Secondary | ICD-10-CM

## 2021-08-19 DIAGNOSIS — Z8639 Personal history of other endocrine, nutritional and metabolic disease: Secondary | ICD-10-CM

## 2021-08-19 DIAGNOSIS — M19072 Primary osteoarthritis, left ankle and foot: Secondary | ICD-10-CM

## 2021-08-19 DIAGNOSIS — R7989 Other specified abnormal findings of blood chemistry: Secondary | ICD-10-CM

## 2021-08-19 DIAGNOSIS — M5030 Other cervical disc degeneration, unspecified cervical region: Secondary | ICD-10-CM

## 2021-08-19 DIAGNOSIS — M791 Myalgia, unspecified site: Secondary | ICD-10-CM

## 2021-08-19 DIAGNOSIS — M19071 Primary osteoarthritis, right ankle and foot: Secondary | ICD-10-CM

## 2021-08-19 LAB — CBC WITH DIFFERENTIAL/PLATELET
Absolute Monocytes: 406 cells/uL (ref 200–950)
Basophils Absolute: 70 cells/uL (ref 0–200)
Basophils Relative: 1.2 %
Eosinophils Absolute: 429 cells/uL (ref 15–500)
Eosinophils Relative: 7.4 %
HCT: 41.8 % (ref 35.0–45.0)
Hemoglobin: 13.7 g/dL (ref 11.7–15.5)
Lymphs Abs: 1908 cells/uL (ref 850–3900)
MCH: 28.5 pg (ref 27.0–33.0)
MCHC: 32.8 g/dL (ref 32.0–36.0)
MCV: 87.1 fL (ref 80.0–100.0)
MPV: 12.3 fL (ref 7.5–12.5)
Monocytes Relative: 7 %
Neutro Abs: 2987 cells/uL (ref 1500–7800)
Neutrophils Relative %: 51.5 %
Platelets: 200 10*3/uL (ref 140–400)
RBC: 4.8 10*6/uL (ref 3.80–5.10)
RDW: 13.1 % (ref 11.0–15.0)
Total Lymphocyte: 32.9 %
WBC: 5.8 10*3/uL (ref 3.8–10.8)

## 2021-08-19 LAB — COMPLETE METABOLIC PANEL WITH GFR
AG Ratio: 1.5 (calc) (ref 1.0–2.5)
ALT: 36 U/L — ABNORMAL HIGH (ref 6–29)
AST: 25 U/L (ref 10–35)
Albumin: 4.4 g/dL (ref 3.6–5.1)
Alkaline phosphatase (APISO): 82 U/L (ref 37–153)
BUN: 10 mg/dL (ref 7–25)
CO2: 29 mmol/L (ref 20–32)
Calcium: 9.8 mg/dL (ref 8.6–10.4)
Chloride: 103 mmol/L (ref 98–110)
Creat: 0.84 mg/dL (ref 0.50–1.03)
Globulin: 3 g/dL (calc) (ref 1.9–3.7)
Glucose, Bld: 90 mg/dL (ref 65–99)
Potassium: 4.7 mmol/L (ref 3.5–5.3)
Sodium: 139 mmol/L (ref 135–146)
Total Bilirubin: 0.7 mg/dL (ref 0.2–1.2)
Total Protein: 7.4 g/dL (ref 6.1–8.1)
eGFR: 82 mL/min/{1.73_m2} (ref >=60)

## 2021-08-19 LAB — VITAMIN D 25 HYDROXY (VIT D DEFICIENCY, FRACTURES): Vit D, 25-Hydroxy: 33 ng/mL (ref 30–100)

## 2021-08-19 NOTE — Patient Instructions (Addendum)
Standing Labs We placed an order today for your standing lab work.   Please have your standing labs drawn in December and every 3 months  If possible, please have your labs drawn 2 weeks prior to your appointment so that the provider can discuss your results at your appointment.  Please note that you may see your imaging and lab results in MyChart before we have reviewed them. We may be awaiting multiple results to interpret others before contacting you. Please allow our office up to 72 hours to thoroughly review all of the results before contacting the office for clarification of your results.  We have open lab daily: Monday through Thursday from 1:30-4:30 PM and Friday from 1:30-4:00 PM at the office of Dr. Pollyann Savoy, [facility].   Please be advised, all patients with office appointments requiring lab work will take precedent over walk-in lab work.  If possible, please come for your lab work on Monday and Friday afternoons, as you may experience shorter wait times. The office is located at [address], [address], [place], [place] [address] No appointment is necessary.   Labs are drawn by [facility]. Please bring your co-pay at the time of your lab draw.  You may receive a bill from [facility] for your lab work.  If you wish to have your labs drawn at another location, please call the office 24 hours in advance to send orders.  If you have any questions regarding directions or hours of operation,  please call [phone].   As a reminder, please drink plenty of water prior to coming for your lab work. Thanks!   If you test POSITIVE for COVID19 and have MILD to MODERATE symptoms: First, call your PCP if you would like to receive COVID19 treatment AND Hold your medications during the infection and for at least 1 week after your symptoms have resolved: Injectable medication (Benlysta, Cimzia, Cosentyx, Enbrel, Humira, Orencia, Remicade, Simponi, Stelara, Taltz,  Tremfya) Methotrexate Leflunomide (Arava) Azathioprine Mycophenolate (Cellcept) Osborne Oman, or Rinvoq Otezla If you take Actemra or Kevzara, you DO NOT need to hold these for COVID19 infection.  If you test POSITIVE for COVID19 and have NO symptoms: First, call your PCP if you would like to receive COVID19 treatment AND Hold your medications for at least 10 days after the day that you tested positive Injectable medication (Benlysta, Cimzia, Cosentyx, Enbrel, Humira, Orencia, Remicade, Simponi, Stelara, Taltz, Tremfya) Methotrexate Leflunomide (Arava) Azathioprine Mycophenolate (Cellcept) Osborne Oman, or Rinvoq Otezla If you take Actemra or Kevzara, you DO NOT need to hold these for COVID19 infection.  If you have signs or symptoms of an infection or start antibiotics: First, call your PCP for workup of your infection. Hold your medication through the infection, until you complete your antibiotics, and until symptoms resolve if you take the following: Injectable medication (Actemra, Benlysta, Cimzia, Cosentyx, Enbrel, Humira, Kevzara, Orencia, Remicade, Simponi, Stelara, Taltz, Tremfya) Methotrexate Leflunomide (Arava) Mycophenolate (Cellcept) Harriette Ohara, Olumiant, or Rinvoq  Vaccines You are taking a medication(s) that can suppress your immune system.  The following immunizations are recommended: Flu annually Covid-19  Td/Tdap (tetanus, diphtheria, pertussis) every 10 years Pneumonia (Prevnar 15 then Pneumovax 23 at least 1 year apart.  Alternatively, can take Prevnar 20 without needing additional dose) Shingrix: 2 doses from 4 weeks to 6 months apart  Please check with your PCP to make sure you are up to date.  Heart Disease Prevention   Your inflammatory disease increases your risk of heart disease which includes  heart attack, stroke, atrial fibrillation (irregular heartbeats), high blood pressure, heart failure and atherosclerosis (plaque in the arteries).  It is  important to reduce your risk by:   Keep blood pressure, cholesterol, and blood sugar at healthy levels   Smoking Cessation   Maintain a healthy weight  BMI 20-25   Eat a healthy diet  Plenty of fresh fruit, vegetables, and whole grains  Limit saturated fats, foods high in sodium, and added sugars  DASH and Mediterranean diet   Increase physical activity  Recommend moderate physically activity for 150 minutes per week/ 30 minutes a day for five days a week These can be broken up into three separate ten-minute sessions during the day.   Reduce Stress  Meditation, slow breathing exercises, yoga, coloring books  Dental visits twice a year

## 2021-08-20 DIAGNOSIS — E559 Vitamin D deficiency, unspecified: Secondary | ICD-10-CM | POA: Diagnosis not present

## 2021-08-20 DIAGNOSIS — N951 Menopausal and female climacteric states: Secondary | ICD-10-CM | POA: Diagnosis not present

## 2021-08-20 DIAGNOSIS — R635 Abnormal weight gain: Secondary | ICD-10-CM | POA: Diagnosis not present

## 2021-08-20 NOTE — Progress Notes (Signed)
CBC is normal, CMP shows mild elevation of ALT, which is a stable.  Vitamin D is low normal.  She should take vitamin D 2000 units over-the-counter daily.

## 2021-08-26 DIAGNOSIS — N951 Menopausal and female climacteric states: Secondary | ICD-10-CM | POA: Diagnosis not present

## 2021-08-26 DIAGNOSIS — Z1339 Encounter for screening examination for other mental health and behavioral disorders: Secondary | ICD-10-CM | POA: Diagnosis not present

## 2021-08-26 DIAGNOSIS — E039 Hypothyroidism, unspecified: Secondary | ICD-10-CM | POA: Diagnosis not present

## 2021-08-26 DIAGNOSIS — N952 Postmenopausal atrophic vaginitis: Secondary | ICD-10-CM | POA: Diagnosis not present

## 2021-08-26 DIAGNOSIS — Z1331 Encounter for screening for depression: Secondary | ICD-10-CM | POA: Diagnosis not present

## 2021-08-26 DIAGNOSIS — L4050 Arthropathic psoriasis, unspecified: Secondary | ICD-10-CM | POA: Diagnosis not present

## 2021-09-01 DIAGNOSIS — E039 Hypothyroidism, unspecified: Secondary | ICD-10-CM | POA: Diagnosis not present

## 2021-09-01 DIAGNOSIS — Z6830 Body mass index (BMI) 30.0-30.9, adult: Secondary | ICD-10-CM | POA: Diagnosis not present

## 2021-09-11 DIAGNOSIS — E039 Hypothyroidism, unspecified: Secondary | ICD-10-CM | POA: Diagnosis not present

## 2021-09-11 DIAGNOSIS — E559 Vitamin D deficiency, unspecified: Secondary | ICD-10-CM | POA: Diagnosis not present

## 2021-09-11 DIAGNOSIS — L659 Nonscarring hair loss, unspecified: Secondary | ICD-10-CM | POA: Diagnosis not present

## 2021-09-15 DIAGNOSIS — R5383 Other fatigue: Secondary | ICD-10-CM | POA: Diagnosis not present

## 2021-09-15 DIAGNOSIS — L659 Nonscarring hair loss, unspecified: Secondary | ICD-10-CM | POA: Diagnosis not present

## 2021-09-15 DIAGNOSIS — E039 Hypothyroidism, unspecified: Secondary | ICD-10-CM | POA: Diagnosis not present

## 2021-09-15 DIAGNOSIS — Z6829 Body mass index (BMI) 29.0-29.9, adult: Secondary | ICD-10-CM | POA: Diagnosis not present

## 2021-09-15 DIAGNOSIS — E559 Vitamin D deficiency, unspecified: Secondary | ICD-10-CM | POA: Diagnosis not present

## 2021-10-14 ENCOUNTER — Other Ambulatory Visit: Payer: Self-pay | Admitting: Rheumatology

## 2021-10-14 DIAGNOSIS — L4050 Arthropathic psoriasis, unspecified: Secondary | ICD-10-CM

## 2021-10-14 NOTE — Telephone Encounter (Signed)
Next Visit: 01/20/2022  Last Visit: 08/19/2021  Last Fill: 04/15/2021  RZ:NBVAPOLID arthritis   Current Dose per office note 08/19/2021: Cosentyx 300 mg every 28 days  Labs: 08/19/2021 CBC is normal, CMP shows mild elevation of ALT, which is a stable.   TB Gold: 02/18/2021 Neg    Okay to refill Cosentyx?

## 2021-10-16 DIAGNOSIS — L659 Nonscarring hair loss, unspecified: Secondary | ICD-10-CM | POA: Diagnosis not present

## 2021-10-16 DIAGNOSIS — Z6828 Body mass index (BMI) 28.0-28.9, adult: Secondary | ICD-10-CM | POA: Diagnosis not present

## 2021-10-16 DIAGNOSIS — E559 Vitamin D deficiency, unspecified: Secondary | ICD-10-CM | POA: Diagnosis not present

## 2021-10-16 DIAGNOSIS — E039 Hypothyroidism, unspecified: Secondary | ICD-10-CM | POA: Diagnosis not present

## 2021-10-27 DIAGNOSIS — D509 Iron deficiency anemia, unspecified: Secondary | ICD-10-CM | POA: Diagnosis not present

## 2021-10-27 DIAGNOSIS — E538 Deficiency of other specified B group vitamins: Secondary | ICD-10-CM | POA: Diagnosis not present

## 2021-10-27 DIAGNOSIS — Z23 Encounter for immunization: Secondary | ICD-10-CM | POA: Diagnosis not present

## 2021-10-29 DIAGNOSIS — R232 Flushing: Secondary | ICD-10-CM | POA: Diagnosis not present

## 2021-10-29 DIAGNOSIS — N898 Other specified noninflammatory disorders of vagina: Secondary | ICD-10-CM | POA: Diagnosis not present

## 2021-10-29 DIAGNOSIS — Z6829 Body mass index (BMI) 29.0-29.9, adult: Secondary | ICD-10-CM | POA: Diagnosis not present

## 2021-10-29 DIAGNOSIS — N951 Menopausal and female climacteric states: Secondary | ICD-10-CM | POA: Diagnosis not present

## 2021-11-05 DIAGNOSIS — Z1231 Encounter for screening mammogram for malignant neoplasm of breast: Secondary | ICD-10-CM | POA: Diagnosis not present

## 2021-11-18 DIAGNOSIS — E039 Hypothyroidism, unspecified: Secondary | ICD-10-CM | POA: Diagnosis not present

## 2021-11-18 DIAGNOSIS — N951 Menopausal and female climacteric states: Secondary | ICD-10-CM | POA: Diagnosis not present

## 2021-11-19 DIAGNOSIS — Z6829 Body mass index (BMI) 29.0-29.9, adult: Secondary | ICD-10-CM | POA: Diagnosis not present

## 2021-11-19 DIAGNOSIS — L659 Nonscarring hair loss, unspecified: Secondary | ICD-10-CM | POA: Diagnosis not present

## 2021-11-19 DIAGNOSIS — E039 Hypothyroidism, unspecified: Secondary | ICD-10-CM | POA: Diagnosis not present

## 2021-11-19 DIAGNOSIS — Z713 Dietary counseling and surveillance: Secondary | ICD-10-CM | POA: Diagnosis not present

## 2021-11-20 DIAGNOSIS — H02422 Myogenic ptosis of left eyelid: Secondary | ICD-10-CM | POA: Diagnosis not present

## 2021-11-20 DIAGNOSIS — H04123 Dry eye syndrome of bilateral lacrimal glands: Secondary | ICD-10-CM | POA: Diagnosis not present

## 2021-11-20 DIAGNOSIS — H11153 Pinguecula, bilateral: Secondary | ICD-10-CM | POA: Diagnosis not present

## 2021-11-20 DIAGNOSIS — H524 Presbyopia: Secondary | ICD-10-CM | POA: Diagnosis not present

## 2021-11-25 DIAGNOSIS — Z6829 Body mass index (BMI) 29.0-29.9, adult: Secondary | ICD-10-CM | POA: Diagnosis not present

## 2021-11-25 DIAGNOSIS — R232 Flushing: Secondary | ICD-10-CM | POA: Diagnosis not present

## 2021-11-25 DIAGNOSIS — N951 Menopausal and female climacteric states: Secondary | ICD-10-CM | POA: Diagnosis not present

## 2021-11-25 DIAGNOSIS — N898 Other specified noninflammatory disorders of vagina: Secondary | ICD-10-CM | POA: Diagnosis not present

## 2021-12-02 ENCOUNTER — Encounter: Payer: Self-pay | Admitting: Rheumatology

## 2022-01-06 NOTE — Progress Notes (Deleted)
Office Visit Note  [name]             Date of Birth: 1966/10/24           MRN: [id number]             PCP: Rodrigo Ran, MD Referring: Rodrigo Ran, MD Visit Date: 01/20/2022 Occupation: @GUAROCC @  Subjective:  No chief complaint on file.   History of Present Illness: Wendy [name] is a 56 y.o. female ***   Activities of Daily Living:  Patient reports morning stiffness for *** {minute/hour:19697}.   Patient {ACTIONS;DENIES/REPORTS:21021675::"Denies"} nocturnal pain.  Difficulty dressing/grooming: {ACTIONS;DENIES/REPORTS:21021675::"Denies"} Difficulty climbing stairs: {ACTIONS;DENIES/REPORTS:21021675::"Denies"} Difficulty getting out of chair: {ACTIONS;DENIES/REPORTS:21021675::"Denies"} Difficulty using hands for taps, buttons, cutlery, and/or writing: {ACTIONS;DENIES/REPORTS:21021675::"Denies"}  No Rheumatology ROS completed.   PMFS History:  Patient Active Problem List   Diagnosis Date Noted   Nonallopathic lesion of lumbosacral region 11/04/2018   Nonallopathic lesion of sacral region 11/04/2018   Nonallopathic lesion of thoracic region 11/04/2018   Psoriasis 07/05/2017   DJD (degenerative joint disease), cervical 02/03/2017   Spondylosis of lumbar region without myelopathy or radiculopathy 02/03/2017   High risk medication use 10/06/2016   Plantar fasciitis 10/06/2016   Achilles tendinitis of both lower extremities 10/06/2016   Vitamin D deficiency 10/06/2016   Primary osteoarthritis of both knees 10/06/2016   Primary osteoarthritis of both feet 10/06/2016   Chronic low back pain without sciatica 10/06/2016   Hypothyroidism 10/06/2016   Polyp of colon 10/06/2016   B12 deficiency 10/06/2016   Psoriatic arthropathy (HCC) 04/11/2012   Anal fissure 04/11/2012    Past Medical History:  Diagnosis Date   Anal fissure 2004   Tx'd with diltiazem  gel by Dr. Kendrick Ranch   Arthritis    Bronchitis, acute 2008   Seasonal allergies     Thyroid disease    URI 10/12/2007   Qualifier: Diagnosis of  By: Alwyn Ren MD, Barbee Shropshire History  Problem Relation Age of Onset   Hypertension Mother    Diabetes Mother    Liver disease Sister        liver transplant    Cirrhosis Sister        non-alcoholic    Past Surgical History:  Procedure Laterality Date   BREAST SURGERY  12/15/2017   breast reduction    Social History   Social History Narrative   Not on file   Immunization History  Administered Date(s) Administered   Moderna Sars-Covid-2 Vaccination 12/29/2019, 01/24/2020, 12/05/2020   Pneumococcal Polysaccharide-23 10/28/2010   Zoster Recombinat (Shingrix) 11/16/2018, 01/30/2019     Objective: Vital Signs: There were no vitals taken for this visit.   Physical Exam   Musculoskeletal Exam: ***  CDAI Exam: CDAI Score: -- Patient Global: --; Provider Global: -- Swollen: --; Tender: -- Joint Exam 01/20/2022   No joint exam has been documented for this visit   There is currently no information documented on the homunculus. Go to the Rheumatology activity and complete the homunculus joint exam.  Investigation: No additional findings.  Imaging: No results found.  Recent Labs: Lab Results  Component Value Date   WBC 5.8 08/19/2021   HGB 13.7 08/19/2021   PLT 200 08/19/2021   NA 139 08/19/2021   K 4.7 08/19/2021   CL 103 08/19/2021   CO2 29 08/19/2021   GLUCOSE 90 08/19/2021   BUN 10 08/19/2021   CREATININE 0.84 08/19/2021   BILITOT 0.7 08/19/2021   ALKPHOS 77 05/27/2017  AST 25 08/19/2021   ALT 36 (H) 08/19/2021   PROT 7.4 08/19/2021   ALBUMIN 3.9 05/27/2017   CALCIUM 9.8 08/19/2021   GFRAA 88 05/16/2021   QFTBGOLDPLUS NEGATIVE 02/18/2021    Speciality Comments: Prior therapy:  failed Humira, Enbrel, Otezla, intolerance to oral methotrexate and Arava Otrexup x1 dose and then discontinued  Procedures:  No procedures performed Allergies: Pollen extract, Arava  [leflunomide], and Methotrexate derivatives   Assessment / Plan:     Visit Diagnoses: No diagnosis found.  Orders: No orders of the defined types were placed in this encounter.  No orders of the defined types were placed in this encounter.   Face-to-face time spent with patient was *** minutes. Greater than 50% of time was spent in counseling and coordination of care.  Follow-Up Instructions: No follow-ups on file.   Earnestine Mealing, CMA  Note - This record has been created using Editor, commissioning.  Chart creation errors have been sought, but may not always  have been located. Such creation errors do not reflect on  the standard of medical care.

## 2022-01-20 ENCOUNTER — Ambulatory Visit: Payer: BC Managed Care – PPO | Admitting: Rheumatology

## 2022-01-20 DIAGNOSIS — M170 Bilateral primary osteoarthritis of knee: Secondary | ICD-10-CM

## 2022-01-20 DIAGNOSIS — Z8639 Personal history of other endocrine, nutritional and metabolic disease: Secondary | ICD-10-CM

## 2022-01-20 DIAGNOSIS — M533 Sacrococcygeal disorders, not elsewhere classified: Secondary | ICD-10-CM

## 2022-01-20 DIAGNOSIS — R7989 Other specified abnormal findings of blood chemistry: Secondary | ICD-10-CM

## 2022-01-20 DIAGNOSIS — M5136 Other intervertebral disc degeneration, lumbar region: Secondary | ICD-10-CM

## 2022-01-20 DIAGNOSIS — M5030 Other cervical disc degeneration, unspecified cervical region: Secondary | ICD-10-CM

## 2022-01-20 DIAGNOSIS — M19072 Primary osteoarthritis, left ankle and foot: Secondary | ICD-10-CM

## 2022-01-20 DIAGNOSIS — L409 Psoriasis, unspecified: Secondary | ICD-10-CM

## 2022-01-20 DIAGNOSIS — Z79899 Other long term (current) drug therapy: Secondary | ICD-10-CM

## 2022-01-20 DIAGNOSIS — E559 Vitamin D deficiency, unspecified: Secondary | ICD-10-CM

## 2022-01-20 DIAGNOSIS — L4050 Arthropathic psoriasis, unspecified: Secondary | ICD-10-CM

## 2022-02-09 DIAGNOSIS — N951 Menopausal and female climacteric states: Secondary | ICD-10-CM | POA: Diagnosis not present

## 2022-02-09 DIAGNOSIS — E039 Hypothyroidism, unspecified: Secondary | ICD-10-CM | POA: Diagnosis not present

## 2022-02-11 DIAGNOSIS — R6882 Decreased libido: Secondary | ICD-10-CM | POA: Diagnosis not present

## 2022-02-11 DIAGNOSIS — Z6828 Body mass index (BMI) 28.0-28.9, adult: Secondary | ICD-10-CM | POA: Diagnosis not present

## 2022-02-11 DIAGNOSIS — N898 Other specified noninflammatory disorders of vagina: Secondary | ICD-10-CM | POA: Diagnosis not present

## 2022-02-11 DIAGNOSIS — N951 Menopausal and female climacteric states: Secondary | ICD-10-CM | POA: Diagnosis not present

## 2022-02-18 NOTE — Progress Notes (Signed)
? ?Office Visit Note ? ?[name]             ?Date of Birth: 27-Oct-1966           ?MRN: [id number]             ?PCP: Crist Infante, MD ?Referring: Crist Infante, MD ?Visit Date: 03/04/2022 ?Occupation: @GUAROCC @ ? ?Subjective:  ?Medication management ? ?History of Present Illness: Wendy [name] is a 56 y.o. female history of psoriatic arthritis, osteoarthritis, degenerative disc disease and psoriasis.  She states that she went to [place] in January and was gone for about 2 months.  She could not carry Cosentyx with her.  She was off Cosentyx for about 2 months.  She states she did well while she was in [place].  She also received aryuvedic treatment.  She continues to have some discomfort in her joints which she describes in her wrist joints and her knees.  She returned on March 14 and took her first dose of Cosentyx.  She developed sinus infection a week ago and is currently on antibiotics.  She still has congestion and cough. ? ?Activities of Daily Living:  ?Patient reports morning stiffness for 15 minutes.   ?Patient Denies nocturnal pain.  ?Difficulty dressing/grooming: Denies ?Difficulty climbing stairs: Reports ?Difficulty getting out of chair: Denies ?Difficulty using hands for taps, buttons, cutlery, and/or writing: Reports ? ?Review of Systems  ?Constitutional:  Positive for fatigue.  ?HENT:  Negative for mouth sores, mouth dryness and nose dryness.   ?Eyes:  Positive for dryness. Negative for pain and itching.  ?Respiratory:  Negative for shortness of breath and difficulty breathing.   ?Cardiovascular:  Negative for chest pain and palpitations.  ?Gastrointestinal:  Negative for blood in stool, constipation and diarrhea.  ?Endocrine: Negative for increased urination.  ?Genitourinary:  Negative for difficulty urinating.  ?Musculoskeletal:  Positive for joint pain, joint pain and morning stiffness. Negative for joint swelling, myalgias, muscle tenderness and myalgias.  ?Skin:  Negative for color change,  rash and redness.  ?Allergic/Immunologic: Positive for susceptible to infections.  ?Neurological:  Negative for dizziness, numbness, headaches, memory loss and weakness.  ?Hematological:  Positive for bruising/bleeding tendency.  ?Psychiatric/Behavioral:  Negative for confusion.   ? ?PMFS History:  ?Patient Active Problem List  ? Diagnosis Date Noted  ? Nonallopathic lesion of lumbosacral region 11/04/2018  ? Nonallopathic lesion of sacral region 11/04/2018  ? Nonallopathic lesion of thoracic region 11/04/2018  ? Psoriasis 07/05/2017  ? DJD (degenerative joint disease), cervical 02/03/2017  ? Spondylosis of lumbar region without myelopathy or radiculopathy 02/03/2017  ? High risk medication use 10/06/2016  ? Plantar fasciitis 10/06/2016  ? Achilles tendinitis of both lower extremities 10/06/2016  ? Vitamin D deficiency 10/06/2016  ? Primary osteoarthritis of both knees 10/06/2016  ? Primary osteoarthritis of both feet 10/06/2016  ? Chronic low back pain without sciatica 10/06/2016  ? Hypothyroidism 10/06/2016  ? Polyp of colon 10/06/2016  ? B12 deficiency 10/06/2016  ? Psoriatic arthropathy ([place]) 04/11/2012  ? Anal fissure 04/11/2012  ?  ?Past Medical History:  ?Diagnosis Date  ? Anal fissure 2004  ? Tx'd with diltiazem  gel by Dr. Lennie Hummer  ? Arthritis   ? Bronchitis, acute 2008  ? Seasonal allergies   ? Thyroid disease   ? URI 10/12/2007  ? Qualifier: Diagnosis of  By: Linna Darner MD, Gwyndolyn Saxon    ?  ?Family History  ?Problem Relation Age of Onset  ? Hypertension Mother   ? Diabetes Mother   ?  Liver disease Sister   ?     liver transplant   ? Cirrhosis Sister   ?     non-alcoholic   ? ?Past Surgical History:  ?Procedure Laterality Date  ? BREAST SURGERY  12/15/2017  ? breast reduction   ? ?Social History  ? ?Social History Narrative  ? Not on file  ? ?Immunization History  ?Administered Date(s) Administered  ? Moderna Sars-Covid-2 Vaccination 12/29/2019, 01/24/2020, 12/05/2020  ? Pneumococcal Polysaccharide-23  10/28/2010  ? Zoster Recombinat (Shingrix) 11/16/2018, 01/30/2019  ?  ? ?Objective: ?Vital Signs: BP 120/84 (BP Location: Left Arm, Patient Position: Sitting, Cuff Size: Normal)   Pulse 69   Ht 5\' 2"  (1.575 m)   Wt 157 lb 9.6 oz (71.5 kg)   BMI 28.83 kg/m?   ? ?Physical Exam ?Vitals and nursing note reviewed.  ?Constitutional:   ?   Appearance: She is well-developed.  ?HENT:  ?   Head: Normocephalic and atraumatic.  ?Eyes:  ?   Conjunctiva/sclera: Conjunctivae normal.  ?Cardiovascular:  ?   Rate and Rhythm: Normal rate and regular rhythm.  ?   Heart sounds: Normal heart sounds.  ?Pulmonary:  ?   Effort: Pulmonary effort is normal.  ?   Breath sounds: Normal breath sounds.  ?Abdominal:  ?   General: Bowel sounds are normal.  ?   Palpations: Abdomen is soft.  ?Musculoskeletal:  ?   Cervical back: Normal range of motion.  ?Lymphadenopathy:  ?   Cervical: No cervical adenopathy.  ?Skin: ?   General: Skin is warm and dry.  ?   Capillary Refill: Capillary refill takes less than 2 seconds.  ?Neurological:  ?   Mental Status: She is alert and oriented to person, place, and time.  ?Psychiatric:     ?   Behavior: Behavior normal.  ?  ? ?Musculoskeletal Exam: C-spine thoracic and lumbar spine with good range of motion.  She had no SI joint tenderness.  Shoulder joints, elbow joints, wrist joints, MCPs PIPs and DIPs and good range of motion with no synovitis.  Hip joints, knee joints, ankles, MTPs and PIPs with good range of motion with no synovitis. ? ?CDAI Exam: ?CDAI Score: -- ?Patient Global: --; Provider Global: -- ?Swollen: --; Tender: -- ?Joint Exam 03/04/2022  ? ?No joint exam has been documented for this visit  ? ?There is currently no information documented on the homunculus. Go to the Rheumatology activity and complete the homunculus joint exam. ? ?Investigation: ?No additional findings. ? ?Imaging: ?No results found. ? ?Recent Labs: ?Lab Results  ?Component Value Date  ? WBC 5.8 08/19/2021  ? HGB 13.7 08/19/2021   ? PLT 200 08/19/2021  ? NA 139 08/19/2021  ? K 4.7 08/19/2021  ? CL 103 08/19/2021  ? CO2 29 08/19/2021  ? GLUCOSE 90 08/19/2021  ? BUN 10 08/19/2021  ? CREATININE 0.84 08/19/2021  ? BILITOT 0.7 08/19/2021  ? ALKPHOS 77 05/27/2017  ? AST 25 08/19/2021  ? ALT 36 (H) 08/19/2021  ? PROT 7.4 08/19/2021  ? ALBUMIN 3.9 05/27/2017  ? CALCIUM 9.8 08/19/2021  ? GFRAA 88 05/16/2021  ? QFTBGOLDPLUS NEGATIVE 02/18/2021  ? ? ?Speciality Comments: Prior therapy:  failed Humira, Enbrel, Otezla, intolerance to oral methotrexate and Arava ?Otrexup x1 dose and then discontinued ? ?Procedures:  ?No procedures performed ?Allergies: Pollen extract, Arava [leflunomide], and Methotrexate derivatives  ? ?Assessment / Plan:     ?Visit Diagnoses: Psoriatic arthritis ([place]) -she was off Cosentyx for approximately 2 months from January  till mid-March.  She resume Cosentyx on March 14.  She started experiencing discomfort in her joints and stiffness.  The symptoms are gradually getting better.  She will continue current dose of Cosentyx.   ? ?Psoriasis-she had no active lesions of psoriasis. ? ?High risk medication use - Cosentyx 300 mg every 28 days.  She had a gap of 2 months.- Plan: CBC with Differential/Platelet, COMPLETE METABOLIC PANEL WITH GFR today then every 3 months.  Information on immunization was placed in the AVS.  She was advised to hold Cosyntex in case she develops an infection.  She currently have sinusitis.  She had all the dose of Cosentyx right before developing the infection. ? ?Sacroiliac pain-she had no SI joint tenderness today. ? ?Primary osteoarthritis of both knees-she has off-and-on discomfort in her knee joints.  No warmth swelling or effusion was noted. ? ?Primary osteoarthritis of both feet-doing better. ? ?DDD (degenerative disc disease), cervical-she has good range of motion of the cervical spine. ? ?DDD (degenerative disc disease), lumbar-she had good range of motion without discomfort. ? ?Acute non-recurrent  sinusitis of other sinus-she was diagnosed with sinusitis by her PCP about a week ago.  She is currently on antibiotics.  I advised her to hold Cosyntex in case her symptoms do not improve. ? ?Elevated LFTs - sh

## 2022-02-26 DIAGNOSIS — R0981 Nasal congestion: Secondary | ICD-10-CM | POA: Diagnosis not present

## 2022-02-26 DIAGNOSIS — J019 Acute sinusitis, unspecified: Secondary | ICD-10-CM | POA: Diagnosis not present

## 2022-03-04 ENCOUNTER — Encounter: Payer: Self-pay | Admitting: Rheumatology

## 2022-03-04 ENCOUNTER — Ambulatory Visit: Payer: BC Managed Care – PPO | Admitting: Rheumatology

## 2022-03-04 VITALS — BP 120/84 | HR 69 | Ht 62.0 in | Wt 157.6 lb

## 2022-03-04 DIAGNOSIS — L4050 Arthropathic psoriasis, unspecified: Secondary | ICD-10-CM

## 2022-03-04 DIAGNOSIS — M19071 Primary osteoarthritis, right ankle and foot: Secondary | ICD-10-CM

## 2022-03-04 DIAGNOSIS — Z79899 Other long term (current) drug therapy: Secondary | ICD-10-CM | POA: Diagnosis not present

## 2022-03-04 DIAGNOSIS — Z8639 Personal history of other endocrine, nutritional and metabolic disease: Secondary | ICD-10-CM

## 2022-03-04 DIAGNOSIS — M170 Bilateral primary osteoarthritis of knee: Secondary | ICD-10-CM

## 2022-03-04 DIAGNOSIS — Z0000 Encounter for general adult medical examination without abnormal findings: Secondary | ICD-10-CM

## 2022-03-04 DIAGNOSIS — M19072 Primary osteoarthritis, left ankle and foot: Secondary | ICD-10-CM

## 2022-03-04 DIAGNOSIS — M533 Sacrococcygeal disorders, not elsewhere classified: Secondary | ICD-10-CM | POA: Diagnosis not present

## 2022-03-04 DIAGNOSIS — R7989 Other specified abnormal findings of blood chemistry: Secondary | ICD-10-CM

## 2022-03-04 DIAGNOSIS — J018 Other acute sinusitis: Secondary | ICD-10-CM

## 2022-03-04 DIAGNOSIS — M5136 Other intervertebral disc degeneration, lumbar region: Secondary | ICD-10-CM

## 2022-03-04 DIAGNOSIS — L409 Psoriasis, unspecified: Secondary | ICD-10-CM

## 2022-03-04 DIAGNOSIS — M5030 Other cervical disc degeneration, unspecified cervical region: Secondary | ICD-10-CM

## 2022-03-04 DIAGNOSIS — E559 Vitamin D deficiency, unspecified: Secondary | ICD-10-CM

## 2022-03-04 NOTE — Patient Instructions (Signed)
Standing Labs ?We placed an order today for your standing lab work.  ? ?Please have your standing labs drawn in July and every 3 months ? ?If possible, please have your labs drawn 2 weeks prior to your appointment so that the provider can discuss your results at your appointment. ? ?Please note that you may see your imaging and lab results in MyChart before we have reviewed them. ?We may be awaiting multiple results to interpret others before contacting you. ?Please allow our office up to 72 hours to thoroughly review all of the results before contacting the office for clarification of your results. ? ?We have open lab daily: ?Monday through Thursday from 1:30-4:30 PM and Friday from 1:30-4:00 PM ?at the office of Dr. Emberlyn Burlison, [facility].   ?Please be advised, all patients with office appointments requiring lab work will take precedent over walk-in lab work.  ?If possible, please come for your lab work on Monday and Friday afternoons, as you may experience shorter wait times. ?The office is located at [address], [address], [place],  [address] ?No appointment is necessary.   ?Labs are drawn by [facility]. Please bring your co-pay at the time of your lab draw.  You may receive a bill from [facility] for your lab work. ? ?Please note if you are on Hydroxychloroquine and and an order has been placed for a Hydroxychloroquine level, you will need to have it drawn 4 hours or more after your last dose. ? ?If you wish to have your labs drawn at another location, please call the office 24 hours in advance to send orders. ? ?If you have any questions regarding directions or hours of operation,  ?please call [phone].   ?As a reminder, please drink plenty of water prior to coming for your lab work. Thanks!  ? ?Vaccines ?You are taking a medication(s) that can suppress your immune system.  The following immunizations are recommended: ?Flu annually ?Covid-19  ?Td/Tdap (tetanus, diphtheria, pertussis)  every 10 years ?Pneumonia (Prevnar 15 then Pneumovax 23 at least 1 year apart.  Alternatively, can take Prevnar 20 without needing additional dose) ?Shingrix: 2 doses from 4 weeks to 6 months apart ? ?Please check with your PCP to make sure you are up to date.  ? ?If you have signs or symptoms of an infection or start antibiotics: ?First, call your PCP for workup of your infection. ?Hold your medication through the infection, until you complete your antibiotics, and until symptoms resolve if you take the following: ?Injectable medication (Actemra, Benlysta, Cimzia, Cosentyx, Enbrel, Humira, Kevzara, Orencia, Remicade, Simponi, Stelara, Taltz, Tremfya) ?Methotrexate ?Leflunomide (Arava) ?Mycophenolate (Cellcept) ?Xeljanz, Olumiant, or Rinvoq  ?Heart Disease Prevention  ? ?Your inflammatory disease increases your risk of heart disease which includes heart attack, stroke, atrial fibrillation (irregular heartbeats), high blood pressure, heart failure and atherosclerosis (plaque in the arteries).  It is important to reduce your risk by:  ? ?Keep blood pressure, cholesterol, and blood sugar at healthy levels  ? ?Smoking Cessation  ? ?Maintain a healthy weight  ?BMI 20-25  ? ?Eat a healthy diet  ?Plenty of fresh fruit, vegetables, and whole grains  ?Limit saturated fats, foods high in sodium, and added sugars  ?DASH and Mediterranean diet  ? ?Increase physical activity  ?Recommend moderate physically activity for 150 minutes per week/ 30 minutes a day for five days a week These can be broken up into three separate ten-minute sessions during the day.  ? ?Reduce Stress  ?Meditation, slow   breathing exercises, yoga, coloring books  ?Dental visits twice a year   ?

## 2022-03-05 DIAGNOSIS — E039 Hypothyroidism, unspecified: Secondary | ICD-10-CM | POA: Diagnosis not present

## 2022-03-05 DIAGNOSIS — E538 Deficiency of other specified B group vitamins: Secondary | ICD-10-CM | POA: Diagnosis not present

## 2022-03-05 DIAGNOSIS — E559 Vitamin D deficiency, unspecified: Secondary | ICD-10-CM | POA: Diagnosis not present

## 2022-03-05 LAB — CBC WITH DIFFERENTIAL/PLATELET
Absolute Monocytes: 531 cells/uL (ref 200–950)
Basophils Absolute: 59 cells/uL (ref 0–200)
Basophils Relative: 1 %
Eosinophils Absolute: 389 cells/uL (ref 15–500)
Eosinophils Relative: 6.6 %
HCT: 41.3 % (ref 35.0–45.0)
Hemoglobin: 13.4 g/dL (ref 11.7–15.5)
Lymphs Abs: 2213 cells/uL (ref 850–3900)
MCH: 28.8 pg (ref 27.0–33.0)
MCHC: 32.4 g/dL (ref 32.0–36.0)
MCV: 88.6 fL (ref 80.0–100.0)
MPV: 12.5 fL (ref 7.5–12.5)
Monocytes Relative: 9 %
Neutro Abs: 2708 cells/uL (ref 1500–7800)
Neutrophils Relative %: 45.9 %
Platelets: 207 10*3/uL (ref 140–400)
RBC: 4.66 10*6/uL (ref 3.80–5.10)
RDW: 13 % (ref 11.0–15.0)
Total Lymphocyte: 37.5 %
WBC: 5.9 10*3/uL (ref 3.8–10.8)

## 2022-03-05 LAB — COMPLETE METABOLIC PANEL WITH GFR
AG Ratio: 1.3 (calc) (ref 1.0–2.5)
ALT: 20 U/L (ref 6–29)
AST: 15 U/L (ref 10–35)
Albumin: 3.9 g/dL (ref 3.6–5.1)
Alkaline phosphatase (APISO): 65 U/L (ref 37–153)
BUN: 9 mg/dL (ref 7–25)
CO2: 27 mmol/L (ref 20–32)
Calcium: 8.8 mg/dL (ref 8.6–10.4)
Chloride: 105 mmol/L (ref 98–110)
Creat: 0.73 mg/dL (ref 0.50–1.03)
Globulin: 3 g/dL (calc) (ref 1.9–3.7)
Glucose, Bld: 79 mg/dL (ref 65–99)
Potassium: 4.7 mmol/L (ref 3.5–5.3)
Sodium: 137 mmol/L (ref 135–146)
Total Bilirubin: 0.5 mg/dL (ref 0.2–1.2)
Total Protein: 6.9 g/dL (ref 6.1–8.1)
eGFR: 96 mL/min/{1.73_m2} (ref >=60)

## 2022-03-05 LAB — HEMOGLOBIN A1C
Hgb A1c MFr Bld: 5.6 % of total Hgb (ref <5.7)
Mean Plasma Glucose: 114 mg/dL
eAG (mmol/L): 6.3 mmol/L

## 2022-03-18 DIAGNOSIS — E559 Vitamin D deficiency, unspecified: Secondary | ICD-10-CM | POA: Diagnosis not present

## 2022-03-18 DIAGNOSIS — L659 Nonscarring hair loss, unspecified: Secondary | ICD-10-CM | POA: Diagnosis not present

## 2022-03-18 DIAGNOSIS — E039 Hypothyroidism, unspecified: Secondary | ICD-10-CM | POA: Diagnosis not present

## 2022-03-18 DIAGNOSIS — Z713 Dietary counseling and surveillance: Secondary | ICD-10-CM | POA: Diagnosis not present

## 2022-04-17 DIAGNOSIS — N951 Menopausal and female climacteric states: Secondary | ICD-10-CM | POA: Diagnosis not present

## 2022-04-17 DIAGNOSIS — E039 Hypothyroidism, unspecified: Secondary | ICD-10-CM | POA: Diagnosis not present

## 2022-04-21 ENCOUNTER — Other Ambulatory Visit: Payer: Self-pay | Admitting: Physician Assistant

## 2022-04-21 DIAGNOSIS — Z9225 Personal history of immunosupression therapy: Secondary | ICD-10-CM

## 2022-04-21 DIAGNOSIS — Z111 Encounter for screening for respiratory tuberculosis: Secondary | ICD-10-CM

## 2022-04-21 DIAGNOSIS — L4050 Arthropathic psoriasis, unspecified: Secondary | ICD-10-CM

## 2022-04-21 DIAGNOSIS — Z79899 Other long term (current) drug therapy: Secondary | ICD-10-CM

## 2022-04-21 NOTE — Telephone Encounter (Signed)
Next Visit: 08/05/2022  Last Visit: 03/04/2022  Last Fill: 10/14/2021  SH:FWYOVZCHY arthritis   Current Dose per office note 03/04/2022: Cosentyx 300 mg every 28 days  Labs: 03/04/2022 CBC and CMP WNL.    TB Gold: 02/18/2021 Neg    Left message to advise patient she is due to update her TB Gold.   Okay to refill Cosentyx?

## 2022-05-06 DIAGNOSIS — N951 Menopausal and female climacteric states: Secondary | ICD-10-CM | POA: Diagnosis not present

## 2022-05-06 DIAGNOSIS — E039 Hypothyroidism, unspecified: Secondary | ICD-10-CM | POA: Diagnosis not present

## 2022-05-14 ENCOUNTER — Other Ambulatory Visit: Payer: Self-pay | Admitting: *Deleted

## 2022-05-14 DIAGNOSIS — Z79899 Other long term (current) drug therapy: Secondary | ICD-10-CM

## 2022-05-14 DIAGNOSIS — Z9225 Personal history of immunosupression therapy: Secondary | ICD-10-CM

## 2022-05-14 DIAGNOSIS — Z111 Encounter for screening for respiratory tuberculosis: Secondary | ICD-10-CM

## 2022-05-14 DIAGNOSIS — L4050 Arthropathic psoriasis, unspecified: Secondary | ICD-10-CM

## 2022-05-14 MED ORDER — FOLIC ACID 1 MG PO TABS: 2.0000 mg | ORAL_TABLET | Freq: Every day | ORAL | 3 refills | Status: DC

## 2022-05-14 NOTE — Telephone Encounter (Signed)
Patient request refill on Folic Acid. Patient would like to know if she can take her Cosentyx once a month rather than every 14 days. Please advise.   Next Visit: 08/05/2022  Last Visit: 03/04/2022  Last Fill: 06/20/2019  Dx: Psoriatic arthritis   Okay to refill Folic Acid?

## 2022-05-15 ENCOUNTER — Telehealth: Payer: Self-pay | Admitting: *Deleted

## 2022-05-15 NOTE — Progress Notes (Signed)
CBC and CMP are normal.

## 2022-05-15 NOTE — Telephone Encounter (Signed)
Patient would like to know if she can take her Cosentyx once a month rather than every 14 days. Please advise.

## 2022-05-17 NOTE — Telephone Encounter (Signed)
Yes, both shots can be taken once a month. Total dose Cosentyx 300 mg sq q week.

## 2022-05-18 ENCOUNTER — Other Ambulatory Visit: Payer: Self-pay | Admitting: Rheumatology

## 2022-05-18 DIAGNOSIS — L4050 Arthropathic psoriasis, unspecified: Secondary | ICD-10-CM

## 2022-05-19 NOTE — Telephone Encounter (Signed)
Patient advised per Dr. Corliss Skains, okay to take both shots once a month. Patient expressed understanding.

## 2022-05-19 NOTE — Telephone Encounter (Signed)
Next Visit: 08/05/2022   Last Visit: 03/04/2022   Last Fill: 04/21/2022 (30 day supply)   LX:BWIOMBTDH arthritis    Current Dose per office note 03/04/2022: Cosentyx 300 mg every 28 days   Labs: 05/14/2022 CBC and CMP are normal.   TB Gold: 02/18/2021 Neg  (Update 05/14/2022, results pending)   Okay to refill Cosentyx?

## 2022-05-20 LAB — QUANTIFERON-TB GOLD PLUS
Mitogen-NIL: 9.39 IU/mL
NIL: 0.01 IU/mL
QuantiFERON-TB Gold Plus: NEGATIVE
TB1-NIL: 0 IU/mL
TB2-NIL: 0.01 IU/mL

## 2022-05-20 LAB — CBC WITH DIFFERENTIAL/PLATELET
Absolute Monocytes: 462 cells/uL (ref 200–950)
Basophils Absolute: 48 cells/uL (ref 0–200)
Basophils Relative: 0.7 %
Eosinophils Absolute: 381 cells/uL (ref 15–500)
Eosinophils Relative: 5.6 %
HCT: 41.4 % (ref 35.0–45.0)
Hemoglobin: 13.6 g/dL (ref 11.7–15.5)
Lymphs Abs: 2047 cells/uL (ref 850–3900)
MCH: 28.9 pg (ref 27.0–33.0)
MCHC: 32.9 g/dL (ref 32.0–36.0)
MCV: 87.9 fL (ref 80.0–100.0)
MPV: 12.1 fL (ref 7.5–12.5)
Monocytes Relative: 6.8 %
Neutro Abs: 3862 cells/uL (ref 1500–7800)
Neutrophils Relative %: 56.8 %
Platelets: 195 10*3/uL (ref 140–400)
RBC: 4.71 10*6/uL (ref 3.80–5.10)
RDW: 13.1 % (ref 11.0–15.0)
Total Lymphocyte: 30.1 %
WBC: 6.8 10*3/uL (ref 3.8–10.8)

## 2022-05-20 LAB — COMPLETE METABOLIC PANEL WITH GFR
AG Ratio: 1.3 (calc) (ref 1.0–2.5)
ALT: 14 U/L (ref 6–29)
AST: 16 U/L (ref 10–35)
Albumin: 4.1 g/dL (ref 3.6–5.1)
Alkaline phosphatase (APISO): 72 U/L (ref 37–153)
BUN: 10 mg/dL (ref 7–25)
CO2: 27 mmol/L (ref 20–32)
Calcium: 9.3 mg/dL (ref 8.6–10.4)
Chloride: 103 mmol/L (ref 98–110)
Creat: 0.78 mg/dL (ref 0.50–1.03)
Globulin: 3.2 g/dL (calc) (ref 1.9–3.7)
Glucose, Bld: 81 mg/dL (ref 65–99)
Potassium: 4.1 mmol/L (ref 3.5–5.3)
Sodium: 138 mmol/L (ref 135–146)
Total Bilirubin: 0.5 mg/dL (ref 0.2–1.2)
Total Protein: 7.3 g/dL (ref 6.1–8.1)
eGFR: 89 mL/min/{1.73_m2} (ref >=60)

## 2022-05-20 NOTE — Progress Notes (Signed)
TB Gold is negative.

## 2022-06-30 DIAGNOSIS — E538 Deficiency of other specified B group vitamins: Secondary | ICD-10-CM | POA: Diagnosis not present

## 2022-06-30 DIAGNOSIS — E039 Hypothyroidism, unspecified: Secondary | ICD-10-CM | POA: Diagnosis not present

## 2022-06-30 DIAGNOSIS — R7989 Other specified abnormal findings of blood chemistry: Secondary | ICD-10-CM | POA: Diagnosis not present

## 2022-06-30 DIAGNOSIS — M8589 Other specified disorders of bone density and structure, multiple sites: Secondary | ICD-10-CM | POA: Diagnosis not present

## 2022-06-30 DIAGNOSIS — E559 Vitamin D deficiency, unspecified: Secondary | ICD-10-CM | POA: Diagnosis not present

## 2022-07-01 DIAGNOSIS — R350 Frequency of micturition: Secondary | ICD-10-CM | POA: Diagnosis not present

## 2022-07-01 DIAGNOSIS — N9489 Other specified conditions associated with female genital organs and menstrual cycle: Secondary | ICD-10-CM | POA: Diagnosis not present

## 2022-07-02 DIAGNOSIS — E538 Deficiency of other specified B group vitamins: Secondary | ICD-10-CM | POA: Diagnosis not present

## 2022-07-02 DIAGNOSIS — Z1389 Encounter for screening for other disorder: Secondary | ICD-10-CM | POA: Diagnosis not present

## 2022-07-02 DIAGNOSIS — R82998 Other abnormal findings in urine: Secondary | ICD-10-CM | POA: Diagnosis not present

## 2022-07-02 DIAGNOSIS — Z0000 Encounter for general adult medical examination without abnormal findings: Secondary | ICD-10-CM | POA: Diagnosis not present

## 2022-07-02 DIAGNOSIS — Z1331 Encounter for screening for depression: Secondary | ICD-10-CM | POA: Diagnosis not present

## 2022-07-02 DIAGNOSIS — E039 Hypothyroidism, unspecified: Secondary | ICD-10-CM | POA: Diagnosis not present

## 2022-07-03 ENCOUNTER — Telehealth: Payer: Self-pay | Admitting: *Deleted

## 2022-07-03 MED ORDER — VITAMIN D (ERGOCALCIFEROL) 1.25 MG (50000 UNIT) PO CAPS: 50000.0000 [IU] | ORAL_CAPSULE | ORAL | 0 refills | Status: AC

## 2022-07-03 NOTE — Telephone Encounter (Signed)
Labs received from:[facility]  Drawn on:06/30/2022  Reviewed by:Dr. Pollyann Savoy   Labs drawn:CBC, CMP, Lipid Panel, TSH, Free T4, Vitamin D, Vitamin B12   Results:CBC/CMP WNL  HDL 61  TSH 0.25  Vitamin D 20  Patient is on Cosentyx 300 mg SQ every 4 weeks.   Per Dr. Corliss Skains patient will nee Vitamin D 50,000 units by mouth once weekly  for 3 months then stay on Vitamin D 200 unit by mouth daily. Patient advised and verbalized understanding. Prescription sent to the pharmacy.

## 2022-07-16 DIAGNOSIS — L659 Nonscarring hair loss, unspecified: Secondary | ICD-10-CM | POA: Diagnosis not present

## 2022-07-16 DIAGNOSIS — E039 Hypothyroidism, unspecified: Secondary | ICD-10-CM | POA: Diagnosis not present

## 2022-07-16 DIAGNOSIS — E559 Vitamin D deficiency, unspecified: Secondary | ICD-10-CM | POA: Diagnosis not present

## 2022-07-22 NOTE — Progress Notes (Deleted)
Office Visit Note  [name]             Date of Birth: 19-Jun-1966           MRN: [id number]             PCP: Rodrigo Ran, MD Referring: Rodrigo Ran, MD Visit Date: 08/05/2022 Occupation: @GUAROCC @  Subjective:  No chief complaint on file.   History of Present Illness: [name] is a 56 y.o. female ***   Activities of Daily Living:  Patient reports morning stiffness for *** {minute/hour:19697}.   Patient {ACTIONS;DENIES/REPORTS:21021675::"Denies"} nocturnal pain.  Difficulty dressing/grooming: {ACTIONS;DENIES/REPORTS:21021675::"Denies"} Difficulty climbing stairs: {ACTIONS;DENIES/REPORTS:21021675::"Denies"} Difficulty getting out of chair: {ACTIONS;DENIES/REPORTS:21021675::"Denies"} Difficulty using hands for taps, buttons, cutlery, and/or writing: {ACTIONS;DENIES/REPORTS:21021675::"Denies"}  No Rheumatology ROS completed.   PMFS History:  Patient Active Problem List   Diagnosis Date Noted  . Nonallopathic lesion of lumbosacral region 11/04/2018  . Nonallopathic lesion of sacral region 11/04/2018  . Nonallopathic lesion of thoracic region 11/04/2018  . Psoriasis 07/05/2017  . DJD (degenerative joint disease), cervical 02/03/2017  . Spondylosis of lumbar region without myelopathy or radiculopathy 02/03/2017  . High risk medication use 10/06/2016  . Plantar fasciitis 10/06/2016  . Achilles tendinitis of both lower extremities 10/06/2016  . Vitamin D deficiency 10/06/2016  . Primary osteoarthritis of both knees 10/06/2016  . Primary osteoarthritis of both feet 10/06/2016  . Chronic low back pain without sciatica 10/06/2016  . Hypothyroidism 10/06/2016  . Polyp of colon 10/06/2016  . B12 deficiency 10/06/2016  . 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 Anal fissure 04/11/2012    Past Medical History:  Diagnosis Date  . Anal fissure 2004   Tx'd with diltiazem  gel by Dr. 2005  . Arthritis   . Bronchitis, acute 2008  . Seasonal allergies   .  Thyroid disease   . URI 10/12/2007   Qualifier: Diagnosis of  By: 13/10/2007 MD, Alwyn Ren History  Problem Relation Age of Onset  . Hypertension Mother   . Diabetes Mother   . Liver disease Sister        liver transplant   . Cirrhosis Sister        non-alcoholic    Past Surgical History:  Procedure Laterality Date  . BREAST SURGERY  12/15/2017   breast reduction    Social History   Social History Narrative  . Not on file   Immunization History  Administered Date(s) Administered  . Moderna Sars-Covid-2 Vaccination 12/29/2019, 01/24/2020, 12/05/2020  . Pneumococcal Polysaccharide-23 10/28/2010  . Zoster Recombinat (Shingrix) 11/16/2018, 01/30/2019     Objective: Vital Signs: There were no vitals taken for this visit.   Physical Exam   Musculoskeletal Exam: ***  CDAI Exam: CDAI Score: -- Patient Global: --; Provider Global: -- Swollen: --; Tender: -- Joint Exam 08/05/2022   No joint exam has been documented for this visit   There is currently no information documented on the homunculus. Go to the Rheumatology activity and complete the homunculus joint exam.  Investigation: No additional findings.  Imaging: No results found.  Recent Labs: Lab Results  Component Value Date   WBC 6.8 05/14/2022   HGB 13.6 05/14/2022   PLT 195 05/14/2022   NA 138 05/14/2022   K 4.1 05/14/2022   CL 103 05/14/2022   CO2 27 05/14/2022   GLUCOSE 81 05/14/2022   BUN 10 05/14/2022   CREATININE 0.78 05/14/2022   BILITOT 0.5 05/14/2022   ALKPHOS 77 05/27/2017  AST 16 05/14/2022   ALT 14 05/14/2022   PROT 7.3 05/14/2022   ALBUMIN 3.9 05/27/2017   CALCIUM 9.3 05/14/2022   GFRAA 88 05/16/2021   QFTBGOLDPLUS NEGATIVE 05/14/2022    Speciality Comments: Prior therapy:  failed Humira, Enbrel, Otezla, intolerance to oral methotrexate and Arava Otrexup x1 dose and then discontinued  Procedures:  No procedures performed Allergies: Pollen extract, Arava [leflunomide],  and Methotrexate derivatives   Assessment / Plan:     Visit Diagnoses: No diagnosis found.  Orders: No orders of the defined types were placed in this encounter.  No orders of the defined types were placed in this encounter.   Face-to-face time spent with patient was *** minutes. Greater than 50% of time was spent in counseling and coordination of care.  Follow-Up Instructions: No follow-ups on file.   Ellen Henri, CMA  Note - This record has been created using Animal nutritionist.  Chart creation errors have been sought, but may not always  have been located. Such creation errors do not reflect on  the standard of medical care.

## 2022-07-23 DIAGNOSIS — R7301 Impaired fasting glucose: Secondary | ICD-10-CM | POA: Diagnosis not present

## 2022-07-23 DIAGNOSIS — L659 Nonscarring hair loss, unspecified: Secondary | ICD-10-CM | POA: Diagnosis not present

## 2022-07-23 DIAGNOSIS — E559 Vitamin D deficiency, unspecified: Secondary | ICD-10-CM | POA: Diagnosis not present

## 2022-07-23 DIAGNOSIS — E039 Hypothyroidism, unspecified: Secondary | ICD-10-CM | POA: Diagnosis not present

## 2022-08-05 ENCOUNTER — Ambulatory Visit: Payer: BC Managed Care – PPO | Admitting: Rheumatology

## 2022-08-05 DIAGNOSIS — M170 Bilateral primary osteoarthritis of knee: Secondary | ICD-10-CM

## 2022-08-05 DIAGNOSIS — Z8639 Personal history of other endocrine, nutritional and metabolic disease: Secondary | ICD-10-CM

## 2022-08-05 DIAGNOSIS — M5030 Other cervical disc degeneration, unspecified cervical region: Secondary | ICD-10-CM

## 2022-08-05 DIAGNOSIS — M533 Sacrococcygeal disorders, not elsewhere classified: Secondary | ICD-10-CM

## 2022-08-05 DIAGNOSIS — L409 Psoriasis, unspecified: Secondary | ICD-10-CM

## 2022-08-05 DIAGNOSIS — M19072 Primary osteoarthritis, left ankle and foot: Secondary | ICD-10-CM

## 2022-08-05 DIAGNOSIS — L4050 Arthropathic psoriasis, unspecified: Secondary | ICD-10-CM

## 2022-08-05 DIAGNOSIS — R7989 Other specified abnormal findings of blood chemistry: Secondary | ICD-10-CM

## 2022-08-05 DIAGNOSIS — Z79899 Other long term (current) drug therapy: Secondary | ICD-10-CM

## 2022-08-05 DIAGNOSIS — J018 Other acute sinusitis: Secondary | ICD-10-CM

## 2022-08-05 DIAGNOSIS — M5136 Other intervertebral disc degeneration, lumbar region: Secondary | ICD-10-CM

## 2022-08-05 DIAGNOSIS — E559 Vitamin D deficiency, unspecified: Secondary | ICD-10-CM

## 2022-09-01 ENCOUNTER — Other Ambulatory Visit: Payer: Self-pay | Admitting: Rheumatology

## 2022-09-01 DIAGNOSIS — L4050 Arthropathic psoriasis, unspecified: Secondary | ICD-10-CM

## 2022-09-01 NOTE — Telephone Encounter (Signed)
Next Visit: Due September 2023   Last Visit: 03/04/2022   Last Fill: 05/19/2022   VQ:[id number] arthritis    Current Dose per office note 03/04/2022: Cosentyx 300 mg every 28 days   Labs: 06/30/2022 WNL   TB Gold: 05/14/2022 Neg   Left message to advise patient she is due for a follow up visit.    Okay to refill Cosentyx?

## 2022-09-09 NOTE — Progress Notes (Signed)
Office Visit Note  [name]             Date of Birth: [birth year]           MRN: [id number]             PCP: Crist Infante, MD Referring: Crist Infante, MD Visit Date: 09/10/2022 Occupation: @GUAROCC @  Subjective:  Joint stiffness   History of Present Illness: Wendy [name] is a 56 y.o. female with history of psoriatic arthritis, osteoarthritis, and DDD.  She remains on cosentyx 300 mg sq injections once every 4 weeks.  She is tolerating Cosentyx without any side effects or injection site reactions.  She had a slight delay with her last dose due to traveling to [place].  She resumed Cosentyx last week.  Patient reports that she is having some increased joint stiffness and arthralgias which she attributes to weather changes.  She denies any joint swelling currently.  She denies any active Achilles tendinitis or plantar fasciitis.  She has some SI joint tenderness and discomfort bilaterally.  Her symptoms have not been severe enough to need over-the-counter products for pain relief.  She denies any active psoriasis at this time.  She has a prescription for clobetasol which she can use as needed.  She denies any recent or recurrent infections.  She denies any new medical conditions. She is planning on receiving the annual flu shot.     Activities of Daily Living:  Patient reports morning stiffness for a few minutes.   Patient Reports nocturnal pain.  Difficulty dressing/grooming: Denies Difficulty climbing stairs: Denies Difficulty getting out of chair: Denies Difficulty using hands for taps, buttons, cutlery, and/or writing: Reports  Review of Systems  Constitutional:  Positive for fatigue.  HENT:  Negative for mouth sores and mouth dryness.   Eyes:  Positive for dryness.  Respiratory:  Negative for shortness of breath.   Cardiovascular:  Negative for chest pain and palpitations.  Gastrointestinal:  Negative for blood in stool, constipation and diarrhea.  Endocrine: Positive  for increased urination.  Genitourinary:  Positive for involuntary urination.  Musculoskeletal:  Positive for joint pain, joint pain, myalgias, muscle weakness, morning stiffness, muscle tenderness and myalgias. Negative for gait problem and joint swelling.  Skin:  Positive for hair loss. Negative for color change, rash and sensitivity to sunlight.  Allergic/Immunologic: Positive for susceptible to infections.  Neurological:  Negative for dizziness and headaches.  Hematological:  Negative for swollen glands.  Psychiatric/Behavioral:  Positive for sleep disturbance. Negative for depressed mood. The patient is not nervous/anxious.     PMFS History:  Patient Active Problem List   Diagnosis Date Noted   Nonallopathic lesion of lumbosacral region 11/04/2018   Nonallopathic lesion of sacral region 11/04/2018   Nonallopathic lesion of thoracic region 11/04/2018   Psoriasis 07/05/2017   DJD (degenerative joint disease), cervical 02/03/2017   Spondylosis of lumbar region without myelopathy or radiculopathy 02/03/2017   High risk medication use 10/06/2016   Plantar fasciitis 10/06/2016   Achilles tendinitis of both lower extremities 10/06/2016   Vitamin D deficiency 10/06/2016   Primary osteoarthritis of both knees 10/06/2016   Primary osteoarthritis of both feet 10/06/2016   Chronic low back pain without sciatica 10/06/2016   Hypothyroidism 10/06/2016   Polyp of colon 10/06/2016   B12 deficiency 10/06/2016   Psoriatic arthropathy ([place]) 04/11/2012   Anal fissure 04/11/2012    Past Medical History:  Diagnosis Date   Anal fissure 2004   Tx'd with diltiazem  gel by Dr. Lennie Hummer   Arthritis    Bronchitis, acute 2008   Psoriatic arthritis ([place])    Seasonal allergies    Thyroid disease    URI 10/12/2007   Qualifier: Diagnosis of  By: Linna Darner MD, Virl Diamond History  Problem Relation Age of Onset   Hypertension Mother    Diabetes Mother    Liver disease Sister        liver  transplant    Cirrhosis Sister        non-alcoholic    Other Sister        fatty liver   Past Surgical History:  Procedure Laterality Date   BREAST SURGERY  12/15/2017   breast reduction    Social History   Social History Narrative   Not on file   Immunization History  Administered Date(s) Administered   Moderna Sars-Covid-2 Vaccination 12/29/2019, 01/24/2020, 12/05/2020   Pneumococcal Polysaccharide-23 10/28/2010   Zoster Recombinat (Shingrix) 11/16/2018, 01/30/2019     Objective: Vital Signs: BP 118/81 (BP Location: Left Arm, Patient Position: Sitting, Cuff Size: Normal)   Pulse 65   Resp 14   Ht 5\' 2"  (1.575 m)   Wt 151 lb 12.8 oz (68.9 kg)   BMI 27.76 kg/m    Physical Exam Vitals and nursing note reviewed.  Constitutional:      Appearance: She is well-developed.  HENT:     Head: Normocephalic and atraumatic.  Eyes:     Conjunctiva/sclera: Conjunctivae normal.  Cardiovascular:     Rate and Rhythm: Normal rate and regular rhythm.     Heart sounds: Normal heart sounds.  Pulmonary:     Effort: Pulmonary effort is normal.     Breath sounds: Normal breath sounds.  Abdominal:     General: Bowel sounds are normal.     Palpations: Abdomen is soft.  Musculoskeletal:     Cervical back: Normal range of motion.  Skin:    General: Skin is warm and dry.     Capillary Refill: Capillary refill takes less than 2 seconds.  Neurological:     Mental Status: She is alert and oriented to person, place, and time.  Psychiatric:        Behavior: Behavior normal.      Musculoskeletal Exam: C-spine, thoracic spine, lumbar spine have good range of motion.  No midline spinal tenderness.  She has mild SI joint tenderness bilaterally.  Shoulder joints, elbow joints, wrist joints, MCPs, PIPs, DIPs have good range of motion with no synovitis.  She was able to make a complete fist bilaterally.  Hip joints have good range of motion with no groin pain.  Knee joints have good range of  motion with no warmth or effusion.  Ankle joints have good range of motion with no tenderness or joint swelling.  No evidence of Achilles tendinitis or plantar fasciitis.  Some stiffness of both ankle joints noted.  No tenderness or synovitis over MTP joints.  CDAI Exam: CDAI Score: -- Patient Global: --; Provider Global: -- Swollen: --; Tender: -- Joint Exam 09/10/2022   No joint exam has been documented for this visit   There is currently no information documented on the homunculus. Go to the Rheumatology activity and complete the homunculus joint exam.  Investigation: No additional findings.  Imaging: No results found.  Recent Labs: Lab Results  Component Value Date   WBC 6.8 05/14/2022   HGB 13.6 05/14/2022   PLT 195 05/14/2022   NA 138  05/14/2022   K 4.1 05/14/2022   CL 103 05/14/2022   CO2 27 05/14/2022   GLUCOSE 81 05/14/2022   BUN 10 05/14/2022   CREATININE 0.78 05/14/2022   BILITOT 0.5 05/14/2022   ALKPHOS 77 05/27/2017   AST 16 05/14/2022   ALT 14 05/14/2022   PROT 7.3 05/14/2022   ALBUMIN 3.9 05/27/2017   CALCIUM 9.3 05/14/2022   GFRAA 88 05/16/2021   QFTBGOLDPLUS NEGATIVE 05/14/2022    Speciality Comments: Prior therapy:  failed Humira, Enbrel, Otezla, intolerance to oral methotrexate and Arava Otrexup x1 dose and then discontinued  Procedures:  No procedures performed Allergies: Pollen extract, Arava [leflunomide], and Methotrexate derivatives   Assessment / Plan:     Visit Diagnoses: Psoriatic arthritis ([place]): She has no synovitis or dactylitis on examination today.  She has not had any signs or symptoms of a psoriatic arthritis flare.  She is currently experiencing some increased arthralgias and joint stiffness which she attributes to weather changes.  No evidence of active Achilles tendinitis or planter fasciitis.  She has mild SI joint tenderness bilaterally.  No active patches of psoriasis currently.  She remains active weight lifting as well as  exercising on a regular basis.  She was recently in [place] and was able to hike without difficulty.  She remains on Cosentyx 300 mg subcu as injections every 4 weeks.  She had a slight delay with her last Cosentyx dose due to recently traveling to [place].  She has been tolerating Cosentyx without any side effects or injection site reactions.  She has not had any recent or recurrent infections.   No medication changes will be made at this time.  She has not needed to take any over-the-counter products for pain relief.  She was advised to notify us if she develops signs or symptoms of a flare.  She will follow-up in the office in 5 months or sooner if needed.  Psoriasis: She has no active psoriasis at this time.  She has a prescription for clobetasol external solution which she can use as needed.  She does not need any refills at this time.  High risk medication use -Cosentyx 300 mg sq injections every 4 weeks. TB gold negative 05/14/22 and will continue to be monitored yearly.  CBC and CMP updated on 06/30/22.  Results were reviewed today in the office.  Her next lab work will be due in November and every 3 months.  Future orders for CBC and CMP were placed today.  Standing orders for CBC and CMP remain in place. She has not had any recent or recurrent infections.  Discussed the importance of holding cosentyx if she develops signs or symptoms of an infection and to resume once the infection has completely cleared. No side effects or injection site reactions with Cosentyx. No new medical conditions. Plan: CBC with Differential/Platelet, COMPLETE METABOLIC PANEL WITH GFR  Sacroiliac pain: She has mild SI joint tenderness upon palpation bilaterally.  Primary osteoarthritis of both knees: She has good range of motion of both knee joints on examination today.  No warmth or effusion noted.  Primary osteoarthritis of both feet: Both ankle joints have good range of motion with no tenderness or synovitis.   No  evidence of active Achilles tendinitis or plantar fasciitis at this time.  She is wearing proper fitting shoes.  DDD (degenerative disc disease), cervical: C-spine has good range of motion with no discomfort at this time.  DDD (degenerative disc disease), lumbar: No midline spinal tenderness at  this time.  No symptoms of radiculopathy.  She takes tizanidine 4 mg at bedtime as needed for muscle spasms.  Elevated LFTs: LFTs were within normal limits on 06/30/2022.  History of hypothyroidism: She remains on Synthroid as prescribed.  Vitamin D deficiency -Vitamin D was 20 on 06/30/2022.  She was given a prescription for vitamin D 50,000 units once weekly for 3 months.  Future order for vitamin D was placed today to be drawn with her upcoming lab work in November.  Plan: VITAMIN D 25 Hydroxy (Vit-D Deficiency, Fractures)  Orders: Orders Placed This Encounter  Procedures   CBC with Differential/Platelet   COMPLETE METABOLIC PANEL WITH GFR   VITAMIN D 25 Hydroxy (Vit-D Deficiency, Fractures)   No orders of the defined types were placed in this encounter.   Follow-Up Instructions: Return in about 5 months (around 02/09/2023) for Psoriatic arthritis, Osteoarthritis, DDD.   Ofilia Neas, PA-C  Note - This record has been created using Dragon software.  Chart creation errors have been sought, but may not always  have been located. Such creation errors do not reflect on  the standard of medical care.

## 2022-09-10 ENCOUNTER — Other Ambulatory Visit: Payer: Self-pay

## 2022-09-10 ENCOUNTER — Encounter: Payer: Self-pay | Admitting: Physician Assistant

## 2022-09-10 ENCOUNTER — Ambulatory Visit: Payer: BC Managed Care – PPO | Attending: Physician Assistant | Admitting: Physician Assistant

## 2022-09-10 VITALS — BP 118/81 | HR 65 | Resp 14 | Ht 62.0 in | Wt 151.8 lb

## 2022-09-10 DIAGNOSIS — R7989 Other specified abnormal findings of blood chemistry: Secondary | ICD-10-CM

## 2022-09-10 DIAGNOSIS — L409 Psoriasis, unspecified: Secondary | ICD-10-CM

## 2022-09-10 DIAGNOSIS — M5030 Other cervical disc degeneration, unspecified cervical region: Secondary | ICD-10-CM

## 2022-09-10 DIAGNOSIS — L4050 Arthropathic psoriasis, unspecified: Secondary | ICD-10-CM | POA: Diagnosis not present

## 2022-09-10 DIAGNOSIS — Z79899 Other long term (current) drug therapy: Secondary | ICD-10-CM | POA: Diagnosis not present

## 2022-09-10 DIAGNOSIS — E559 Vitamin D deficiency, unspecified: Secondary | ICD-10-CM

## 2022-09-10 DIAGNOSIS — M5136 Other intervertebral disc degeneration, lumbar region: Secondary | ICD-10-CM

## 2022-09-10 DIAGNOSIS — M19071 Primary osteoarthritis, right ankle and foot: Secondary | ICD-10-CM

## 2022-09-10 DIAGNOSIS — M533 Sacrococcygeal disorders, not elsewhere classified: Secondary | ICD-10-CM | POA: Diagnosis not present

## 2022-09-10 DIAGNOSIS — M19072 Primary osteoarthritis, left ankle and foot: Secondary | ICD-10-CM

## 2022-09-10 DIAGNOSIS — Z8639 Personal history of other endocrine, nutritional and metabolic disease: Secondary | ICD-10-CM

## 2022-09-10 DIAGNOSIS — M170 Bilateral primary osteoarthritis of knee: Secondary | ICD-10-CM

## 2022-09-10 MED ORDER — COSENTYX SENSOREADY (300 MG) 150 MG/ML ~~LOC~~ SOAJ: SUBCUTANEOUS | 0 refills | Status: DC

## 2022-09-10 NOTE — Progress Notes (Unsigned)
Please review and sign pended cosentyx refill that was requested today. Thanks!

## 2022-09-14 DIAGNOSIS — E039 Hypothyroidism, unspecified: Secondary | ICD-10-CM | POA: Diagnosis not present

## 2022-09-14 DIAGNOSIS — R7301 Impaired fasting glucose: Secondary | ICD-10-CM | POA: Diagnosis not present

## 2022-11-17 ENCOUNTER — Other Ambulatory Visit: Payer: Self-pay | Admitting: *Deleted

## 2022-11-17 DIAGNOSIS — Z79899 Other long term (current) drug therapy: Secondary | ICD-10-CM

## 2022-11-18 LAB — COMPLETE METABOLIC PANEL WITH GFR
AG Ratio: 1.4 (calc) (ref 1.0–2.5)
ALT: 28 U/L (ref 6–29)
AST: 21 U/L (ref 10–35)
Albumin: 4.2 g/dL (ref 3.6–5.1)
Alkaline phosphatase (APISO): 65 U/L (ref 37–153)
BUN: 13 mg/dL (ref 7–25)
CO2: 28 mmol/L (ref 20–32)
Calcium: 9.5 mg/dL (ref 8.6–10.4)
Chloride: 103 mmol/L (ref 98–110)
Creat: 0.82 mg/dL (ref 0.50–1.03)
Globulin: 3 g/dL (calc) (ref 1.9–3.7)
Glucose, Bld: 81 mg/dL (ref 65–99)
Potassium: 4.6 mmol/L (ref 3.5–5.3)
Sodium: 139 mmol/L (ref 135–146)
Total Bilirubin: 0.5 mg/dL (ref 0.2–1.2)
Total Protein: 7.2 g/dL (ref 6.1–8.1)
eGFR: 83 mL/min/{1.73_m2} (ref >=60)

## 2022-11-18 LAB — CBC WITH DIFFERENTIAL/PLATELET
Absolute Monocytes: 351 cells/uL (ref 200–950)
Basophils Absolute: 41 cells/uL (ref 0–200)
Basophils Relative: 0.9 %
Eosinophils Absolute: 261 cells/uL (ref 15–500)
Eosinophils Relative: 5.8 %
HCT: 38.2 % (ref 35.0–45.0)
Hemoglobin: 12.6 g/dL (ref 11.7–15.5)
Lymphs Abs: 2277 cells/uL (ref 850–3900)
MCH: 28.3 pg (ref 27.0–33.0)
MCHC: 33 g/dL (ref 32.0–36.0)
MCV: 85.8 fL (ref 80.0–100.0)
MPV: 12.3 fL (ref 7.5–12.5)
Monocytes Relative: 7.8 %
Neutro Abs: 1571 cells/uL (ref 1500–7800)
Neutrophils Relative %: 34.9 %
Platelets: 183 10*3/uL (ref 140–400)
RBC: 4.45 10*6/uL (ref 3.80–5.10)
RDW: 13.1 % (ref 11.0–15.0)
Total Lymphocyte: 50.6 %
WBC: 4.5 10*3/uL (ref 3.8–10.8)

## 2022-11-18 NOTE — Progress Notes (Signed)
CBC and CMP are normal.

## 2022-11-19 DIAGNOSIS — E559 Vitamin D deficiency, unspecified: Secondary | ICD-10-CM | POA: Diagnosis not present

## 2022-11-19 DIAGNOSIS — E039 Hypothyroidism, unspecified: Secondary | ICD-10-CM | POA: Diagnosis not present

## 2022-11-19 DIAGNOSIS — E538 Deficiency of other specified B group vitamins: Secondary | ICD-10-CM | POA: Diagnosis not present

## 2022-12-02 ENCOUNTER — Other Ambulatory Visit: Payer: Self-pay | Admitting: Physician Assistant

## 2022-12-02 DIAGNOSIS — L4050 Arthropathic psoriasis, unspecified: Secondary | ICD-10-CM

## 2022-12-02 NOTE — Telephone Encounter (Signed)
Next Visit: 02/11/2023  Last Visit: 09/10/2022  Last Fill: 09/10/2022  ZS:[id number] arthritis   Current Dose per office note 09/10/2022: Cosentyx 300 mg sq injections every 4 weeks.   Labs: 11/17/2022 CBC and CMP are normal   TB Gold: 05/14/2022 Neg    Okay to refill Cosentyx?

## 2022-12-04 NOTE — Progress Notes (Unsigned)
Office Visit Note  [name]             Date of Birth: 1966-04-29           MRN: [id number]             PCP: Crist Infante, MD Referring: Crist Infante, MD Visit Date: 12/07/2022 Occupation: @GUAROCC @  Subjective:  No chief complaint on file.   History of Present Illness: Wendy [name] is a 57 y.o. female ***     Activities of Daily Living:  Patient reports morning stiffness for *** {minute/hour:19697}.   Patient {ACTIONS;DENIES/REPORTS:21021675::"Denies"} nocturnal pain.  Difficulty dressing/grooming: {ACTIONS;DENIES/REPORTS:21021675::"Denies"} Difficulty climbing stairs: {ACTIONS;DENIES/REPORTS:21021675::"Denies"} Difficulty getting out of chair: {ACTIONS;DENIES/REPORTS:21021675::"Denies"} Difficulty using hands for taps, buttons, cutlery, and/or writing: {ACTIONS;DENIES/REPORTS:21021675::"Denies"}  No Rheumatology ROS completed.   PMFS History:  Patient Active Problem List   Diagnosis Date Noted   Nonallopathic lesion of lumbosacral region 11/04/2018   Nonallopathic lesion of sacral region 11/04/2018   Nonallopathic lesion of thoracic region 11/04/2018   Psoriasis 07/05/2017   DJD (degenerative joint disease), cervical 02/03/2017   Spondylosis of lumbar region without myelopathy or radiculopathy 02/03/2017   High risk medication use 10/06/2016   Plantar fasciitis 10/06/2016   Achilles tendinitis of both lower extremities 10/06/2016   Vitamin D deficiency 10/06/2016   Primary osteoarthritis of both knees 10/06/2016   Primary osteoarthritis of both feet 10/06/2016   Chronic low back pain without sciatica 10/06/2016   Hypothyroidism 10/06/2016   Polyp of colon 10/06/2016   B12 deficiency 10/06/2016   Psoriatic arthropathy ([place]) 04/11/2012   Anal fissure 04/11/2012    Past Medical History:  Diagnosis Date   Anal fissure 2004   Tx'd with diltiazem  gel by Dr. Lennie Hummer   Arthritis    Bronchitis, acute 2008   Psoriatic arthritis ([place])    Seasonal  allergies    Thyroid disease    URI 10/12/2007   Qualifier: Diagnosis of  By: Linna Darner MD, Virl Diamond History  Problem Relation Age of Onset   Hypertension Mother    Diabetes Mother    Liver disease Sister        liver transplant    Cirrhosis Sister        non-alcoholic    Other Sister        fatty liver   Past Surgical History:  Procedure Laterality Date   BREAST SURGERY  12/15/2017   breast reduction    Social History   Social History Narrative   Not on file   Immunization History  Administered Date(s) Administered   Moderna Sars-Covid-2 Vaccination 12/29/2019, 01/24/2020, 12/05/2020   Pneumococcal Polysaccharide-23 10/28/2010   Zoster Recombinat (Shingrix) 11/16/2018, 01/30/2019     Objective: Vital Signs: There were no vitals taken for this visit.   Physical Exam   Musculoskeletal Exam: ***  CDAI Exam: CDAI Score: -- Patient Global: --; Provider Global: -- Swollen: --; Tender: -- Joint Exam 12/07/2022   No joint exam has been documented for this visit   There is currently no information documented on the homunculus. Go to the Rheumatology activity and complete the homunculus joint exam.  Investigation: No additional findings.  Imaging: No results found.  Recent Labs: Lab Results  Component Value Date   WBC 4.5 11/17/2022   HGB 12.6 11/17/2022   PLT 183 11/17/2022   NA 139 11/17/2022   K 4.6 11/17/2022   CL 103 11/17/2022   CO2 28 11/17/2022   GLUCOSE 81  11/17/2022   BUN 13 11/17/2022   CREATININE 0.82 11/17/2022   BILITOT 0.5 11/17/2022   ALKPHOS 77 05/27/2017   AST 21 11/17/2022   ALT 28 11/17/2022   PROT 7.2 11/17/2022   ALBUMIN 3.9 05/27/2017   CALCIUM 9.5 11/17/2022   GFRAA 88 05/16/2021   QFTBGOLDPLUS NEGATIVE 05/14/2022    Speciality Comments: Prior therapy:  failed Humira, Enbrel, Otezla, intolerance to oral methotrexate and Arava Otrexup x1 dose and then discontinued  Procedures:  No procedures  performed Allergies: Pollen extract, Arava [leflunomide], and Methotrexate derivatives   Assessment / Plan:     Visit Diagnoses: No diagnosis found.  Orders: No orders of the defined types were placed in this encounter.  No orders of the defined types were placed in this encounter.   Face-to-face time spent with patient was *** minutes. Greater than 50% of time was spent in counseling and coordination of care.  Follow-Up Instructions: No follow-ups on file.   Earnestine Mealing, CMA  Note - This record has been created using Editor, commissioning.  Chart creation errors have been sought, but may not always  have been located. Such creation errors do not reflect on  the standard of medical care.

## 2022-12-07 ENCOUNTER — Ambulatory Visit: Payer: BC Managed Care – PPO | Attending: Rheumatology | Admitting: Rheumatology

## 2022-12-07 ENCOUNTER — Encounter: Payer: Self-pay | Admitting: Rheumatology

## 2022-12-07 VITALS — BP 133/85 | HR 60 | Resp 15 | Ht 62.0 in | Wt 156.6 lb

## 2022-12-07 DIAGNOSIS — M170 Bilateral primary osteoarthritis of knee: Secondary | ICD-10-CM

## 2022-12-07 DIAGNOSIS — M5136 Other intervertebral disc degeneration, lumbar region: Secondary | ICD-10-CM

## 2022-12-07 DIAGNOSIS — L409 Psoriasis, unspecified: Secondary | ICD-10-CM | POA: Diagnosis not present

## 2022-12-07 DIAGNOSIS — Z79899 Other long term (current) drug therapy: Secondary | ICD-10-CM | POA: Diagnosis not present

## 2022-12-07 DIAGNOSIS — Z8639 Personal history of other endocrine, nutritional and metabolic disease: Secondary | ICD-10-CM

## 2022-12-07 DIAGNOSIS — E559 Vitamin D deficiency, unspecified: Secondary | ICD-10-CM | POA: Diagnosis not present

## 2022-12-07 DIAGNOSIS — M19071 Primary osteoarthritis, right ankle and foot: Secondary | ICD-10-CM

## 2022-12-07 DIAGNOSIS — M533 Sacrococcygeal disorders, not elsewhere classified: Secondary | ICD-10-CM | POA: Diagnosis not present

## 2022-12-07 DIAGNOSIS — L4050 Arthropathic psoriasis, unspecified: Secondary | ICD-10-CM | POA: Diagnosis not present

## 2022-12-07 DIAGNOSIS — M19072 Primary osteoarthritis, left ankle and foot: Secondary | ICD-10-CM

## 2022-12-07 DIAGNOSIS — R7989 Other specified abnormal findings of blood chemistry: Secondary | ICD-10-CM

## 2022-12-07 DIAGNOSIS — M5030 Other cervical disc degeneration, unspecified cervical region: Secondary | ICD-10-CM

## 2022-12-07 MED ORDER — LIDOCAINE HCL 1 % IJ SOLN
1.0000 mL | INTRAMUSCULAR | Status: AC | PRN
  Administered 2022-12-07: 1 mL

## 2022-12-07 MED ORDER — TRIAMCINOLONE ACETONIDE 40 MG/ML IJ SUSP
40.0000 mg | INTRAMUSCULAR | Status: AC | PRN
  Administered 2022-12-07: 40 mg via INTRA_ARTICULAR

## 2022-12-07 NOTE — Patient Instructions (Signed)
Standing Labs We placed an order today for your standing lab work.   Please have your standing labs drawn in March and every 3 months  Please have your labs drawn 2 weeks prior to your appointment so that the provider can discuss your lab results at your appointment.  Please note that you may see your imaging and lab results in MyChart before we have reviewed them. We will contact you once all results are reviewed. Please allow our office up to 72 hours to thoroughly review all of the results before contacting the office for clarification of your results.  Lab hours are:   Monday through Thursday from 8:00 am -12:30 pm and 1:00 pm-5:00 pm and Friday from 8:00 am-12:00 pm.  Please be advised, all patients with office appointments requiring lab work will take precedent over walk-in lab work.   Labs are drawn by [facility]. Please bring your co-pay at the time of your lab draw.  You may receive a bill from [facility] for your lab work.  Please note if you are on Hydroxychloroquine and and an order has been placed for a Hydroxychloroquine level, you will need to have it drawn 4 hours or more after your last dose.  If you wish to have your labs drawn at another location, please call the office 24 hours in advance so we can fax the orders.  The office is located at [address], [address], [place], [place] [address] No appointment is necessary.    If you have any questions regarding directions or hours of operation,  please call [phone].   As a reminder, please drink plenty of water prior to coming for your lab work. Thanks!   Vaccines You are taking a medication(s) that can suppress your immune system.  The following immunizations are recommended: Flu annually Covid-19  RSV Td/Tdap (tetanus, diphtheria, pertussis) every 10 years Pneumonia (Prevnar 15 then Pneumovax 23 at least 1 year apart.  Alternatively, can take Prevnar 20 without needing additional dose) Shingrix: 2 doses from 4  weeks to 6 months apart  Please check with your PCP to make sure you are up to date.   If you have signs or symptoms of an infection or start antibiotics: First, call your PCP for workup of your infection. Hold your medication through the infection, until you complete your antibiotics, and until symptoms resolve if you take the following: Injectable medication (Actemra, Benlysta, Cimzia, Cosentyx, Enbrel, Humira, Kevzara, Orencia, Remicade, Simponi, Stelara, Taltz, Tremfya) Methotrexate Leflunomide (Arava) Mycophenolate (Cellcept) Xeljanz, Olumiant, or Rinvoq  

## 2022-12-08 LAB — SEDIMENTATION RATE: Sed Rate: 6 mm/h (ref 0–30)

## 2022-12-08 LAB — VITAMIN D 25 HYDROXY (VIT D DEFICIENCY, FRACTURES): Vit D, 25-Hydroxy: 47 ng/mL (ref 30–100)

## 2022-12-08 NOTE — Progress Notes (Signed)
Sed rate is normal which does not indicate inflammation.  Vitamin D is normal and in desirable range.

## 2022-12-09 DIAGNOSIS — Z1231 Encounter for screening mammogram for malignant neoplasm of breast: Secondary | ICD-10-CM | POA: Diagnosis not present

## 2023-01-18 DIAGNOSIS — E039 Hypothyroidism, unspecified: Secondary | ICD-10-CM | POA: Diagnosis not present

## 2023-01-27 DIAGNOSIS — E559 Vitamin D deficiency, unspecified: Secondary | ICD-10-CM | POA: Diagnosis not present

## 2023-01-27 DIAGNOSIS — E039 Hypothyroidism, unspecified: Secondary | ICD-10-CM | POA: Diagnosis not present

## 2023-01-27 DIAGNOSIS — R5383 Other fatigue: Secondary | ICD-10-CM | POA: Diagnosis not present

## 2023-01-28 NOTE — Progress Notes (Deleted)
Office Visit Note  [name]             Date of Birth: 21-Feb-1966           MRN: [id number]             PCP: Crist Infante, MD Referring: Crist Infante, MD Visit Date: 02/11/2023 Occupation: '@GUAROCC'$ @  Subjective:  No chief complaint on file.   History of Present Illness: Wendy [name] is a 57 y.o. female ***     Activities of Daily Living:  Patient reports morning stiffness for *** {minute/hour:19697}.   Patient {ACTIONS;DENIES/REPORTS:21021675::"Denies"} nocturnal pain.  Difficulty dressing/grooming: {ACTIONS;DENIES/REPORTS:21021675::"Denies"} Difficulty climbing stairs: {ACTIONS;DENIES/REPORTS:21021675::"Denies"} Difficulty getting out of chair: {ACTIONS;DENIES/REPORTS:21021675::"Denies"} Difficulty using hands for taps, buttons, cutlery, and/or writing: {ACTIONS;DENIES/REPORTS:21021675::"Denies"}  No Rheumatology ROS completed.   PMFS History:  Patient Active Problem List   Diagnosis Date Noted   Nonallopathic lesion of lumbosacral region 11/04/2018   Nonallopathic lesion of sacral region 11/04/2018   Nonallopathic lesion of thoracic region 11/04/2018   Psoriasis 07/05/2017   DJD (degenerative joint disease), cervical 02/03/2017   Spondylosis of lumbar region without myelopathy or radiculopathy 02/03/2017   High risk medication use 10/06/2016   Plantar fasciitis 10/06/2016   Achilles tendinitis of both lower extremities 10/06/2016   Vitamin D deficiency 10/06/2016   Primary osteoarthritis of both knees 10/06/2016   Primary osteoarthritis of both feet 10/06/2016   Chronic low back pain without sciatica 10/06/2016   Hypothyroidism 10/06/2016   Polyp of colon 10/06/2016   B12 deficiency 10/06/2016   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 Lennie Hummer   Arthritis    Bronchitis, acute 2008   Psoriatic arthritis ([place])    Seasonal  allergies    Thyroid disease    URI 10/12/2007   Qualifier: Diagnosis of  By: Linna Darner MD, Virl Diamond History  Problem Relation Age of Onset   Hypertension Mother    Diabetes Mother    Liver disease Sister        liver transplant    Cirrhosis Sister        non-alcoholic    Other Sister        fatty liver   Past Surgical History:  Procedure Laterality Date   BREAST SURGERY  12/15/2017   breast reduction    Social History   Social History Narrative   Not on file   Immunization History  Administered Date(s) Administered   Moderna Sars-Covid-2 Vaccination 12/29/2019, 01/24/2020, 12/05/2020   Pneumococcal Polysaccharide-23 10/28/2010   Zoster Recombinat (Shingrix) 11/16/2018, 01/30/2019     Objective: Vital Signs: There were no vitals taken for this visit.   Physical Exam   Musculoskeletal Exam: ***  CDAI Exam: CDAI Score: -- Patient Global: --; Provider Global: -- Swollen: --; Tender: -- Joint Exam 02/11/2023   No joint exam has been documented for this visit   There is currently no information documented on the homunculus. Go to the Rheumatology activity and complete the homunculus joint exam.  Investigation: No additional findings.  Imaging: No results found.  Recent Labs: Lab Results  Component Value Date   WBC 4.5 11/17/2022   HGB 12.6 11/17/2022   PLT 183 11/17/2022   NA 139 11/17/2022   K 4.6 11/17/2022   CL 103 11/17/2022   CO2 28 11/17/2022   GLUCOSE 81  11/17/2022   BUN 13 11/17/2022   CREATININE 0.82 11/17/2022   BILITOT 0.5 11/17/2022   ALKPHOS 77 05/27/2017   AST 21 11/17/2022   ALT 28 11/17/2022   PROT 7.2 11/17/2022   ALBUMIN 3.9 05/27/2017   CALCIUM 9.5 11/17/2022   GFRAA 88 05/16/2021   QFTBGOLDPLUS NEGATIVE 05/14/2022   November 17, 2022 CBC and CMP normal, May 14, 2022 TB Gold negative, January 2024 vitamin D 47, sed rate 6  Speciality Comments: Prior therapy:  failed Humira, Enbrel, Otezla, intolerance to oral  methotrexate and Arava Otrexup x1 dose and then discontinued  Procedures:  No procedures performed Allergies: Pollen extract, Arava [leflunomide], and Methotrexate derivatives   Assessment / Plan:     Visit Diagnoses: Psoriatic arthritis ([place]) - h/o inflammatory arthritis, [place] fasciitis, Achilles tendinitis, sacroiliitis:  Psoriasis - Uses topical clobetasol as needed.  High risk medication use - on Cosentyx 300 mg sq injections every 4 weeks.  Labs from November 17, 2022 CBC and CMP were normal.  TB Gold was negative on May 14, 2022  Sacroiliac pain - Bilateral SI joint injection on December 07, 2022.  Primary osteoarthritis of both knees  Primary osteoarthritis of both feet  DDD (degenerative disc disease), cervical  DDD (degenerative disc disease), lumbar - She takes tizanidine 4 mg p.o. nightly as needed  Vitamin D deficiency  History of hypothyroidism  Myofascial pain  Orders: No orders of the defined types were placed in this encounter.  No orders of the defined types were placed in this encounter.   Face-to-face time spent with patient was *** minutes. Greater than 50% of time was spent in counseling and coordination of care.  Follow-Up Instructions: No follow-ups on file.   Bo Merino, MD  Note - This record has been created using Editor, commissioning.  Chart creation errors have been sought, but may not always  have been located. Such creation errors do not reflect on  the standard of medical care.

## 2023-02-03 DIAGNOSIS — D509 Iron deficiency anemia, unspecified: Secondary | ICD-10-CM | POA: Diagnosis not present

## 2023-02-03 DIAGNOSIS — E039 Hypothyroidism, unspecified: Secondary | ICD-10-CM | POA: Diagnosis not present

## 2023-02-03 DIAGNOSIS — E538 Deficiency of other specified B group vitamins: Secondary | ICD-10-CM | POA: Diagnosis not present

## 2023-02-03 DIAGNOSIS — G4733 Obstructive sleep apnea (adult) (pediatric): Secondary | ICD-10-CM | POA: Diagnosis not present

## 2023-02-03 DIAGNOSIS — E559 Vitamin D deficiency, unspecified: Secondary | ICD-10-CM | POA: Diagnosis not present

## 2023-02-11 ENCOUNTER — Ambulatory Visit: Payer: BC Managed Care – PPO | Admitting: Rheumatology

## 2023-02-11 DIAGNOSIS — L409 Psoriasis, unspecified: Secondary | ICD-10-CM

## 2023-02-11 DIAGNOSIS — E559 Vitamin D deficiency, unspecified: Secondary | ICD-10-CM

## 2023-02-11 DIAGNOSIS — M533 Sacrococcygeal disorders, not elsewhere classified: Secondary | ICD-10-CM

## 2023-02-11 DIAGNOSIS — M7918 Myalgia, other site: Secondary | ICD-10-CM

## 2023-02-11 DIAGNOSIS — M19071 Primary osteoarthritis, right ankle and foot: Secondary | ICD-10-CM

## 2023-02-11 DIAGNOSIS — Z8639 Personal history of other endocrine, nutritional and metabolic disease: Secondary | ICD-10-CM

## 2023-02-11 DIAGNOSIS — M170 Bilateral primary osteoarthritis of knee: Secondary | ICD-10-CM

## 2023-02-11 DIAGNOSIS — M47816 Spondylosis without myelopathy or radiculopathy, lumbar region: Secondary | ICD-10-CM

## 2023-02-11 DIAGNOSIS — L4050 Arthropathic psoriasis, unspecified: Secondary | ICD-10-CM

## 2023-02-11 DIAGNOSIS — Z79899 Other long term (current) drug therapy: Secondary | ICD-10-CM

## 2023-02-11 DIAGNOSIS — M5030 Other cervical disc degeneration, unspecified cervical region: Secondary | ICD-10-CM

## 2023-03-01 DIAGNOSIS — T783XXD Angioneurotic edema, subsequent encounter: Secondary | ICD-10-CM | POA: Diagnosis not present

## 2023-03-01 DIAGNOSIS — J3089 Other allergic rhinitis: Secondary | ICD-10-CM | POA: Diagnosis not present

## 2023-03-01 DIAGNOSIS — J301 Allergic rhinitis due to pollen: Secondary | ICD-10-CM | POA: Diagnosis not present

## 2023-03-01 DIAGNOSIS — J3081 Allergic rhinitis due to animal (cat) (dog) hair and dander: Secondary | ICD-10-CM | POA: Diagnosis not present

## 2023-03-02 DIAGNOSIS — E559 Vitamin D deficiency, unspecified: Secondary | ICD-10-CM | POA: Diagnosis not present

## 2023-03-02 DIAGNOSIS — E039 Hypothyroidism, unspecified: Secondary | ICD-10-CM | POA: Diagnosis not present

## 2023-03-09 ENCOUNTER — Other Ambulatory Visit: Payer: Self-pay | Admitting: *Deleted

## 2023-03-09 ENCOUNTER — Encounter: Payer: Self-pay | Admitting: *Deleted

## 2023-03-09 ENCOUNTER — Other Ambulatory Visit: Payer: Self-pay | Admitting: Physician Assistant

## 2023-03-09 DIAGNOSIS — Z79899 Other long term (current) drug therapy: Secondary | ICD-10-CM

## 2023-03-09 DIAGNOSIS — L4050 Arthropathic psoriasis, unspecified: Secondary | ICD-10-CM

## 2023-03-09 NOTE — Progress Notes (Signed)
CBC within normal limits.

## 2023-03-09 NOTE — Telephone Encounter (Signed)
Last Fill: 12/02/2022  Labs: 11/17/2022 CBC and CMP are normal.   TB Gold: 05/14/2022 Neg    Next Visit: 05/19/2023  Last Visit: 12/07/2022  ME:BRAXENMMH arthritis   Current Dose per office note 12/07/2022: Cosentyx 300 mg sq injections every 4 weeks   Message sent to patient via my chart to advise she is due to update labs.   Okay to refill Cosentyx?

## 2023-03-10 LAB — CBC WITH DIFFERENTIAL/PLATELET
Absolute Monocytes: 443 cells/uL (ref 200–950)
Basophils Absolute: 83 cells/uL (ref 0–200)
Basophils Relative: 1.4 %
Eosinophils Absolute: 342 cells/uL (ref 15–500)
Eosinophils Relative: 5.8 %
HCT: 42.4 % (ref 35.0–45.0)
Hemoglobin: 13.8 g/dL (ref 11.7–15.5)
Lymphs Abs: 2260 cells/uL (ref 850–3900)
MCH: 28.8 pg (ref 27.0–33.0)
MCHC: 32.5 g/dL (ref 32.0–36.0)
MCV: 88.3 fL (ref 80.0–100.0)
MPV: 12 fL (ref 7.5–12.5)
Monocytes Relative: 7.5 %
Neutro Abs: 2773 cells/uL (ref 1500–7800)
Neutrophils Relative %: 47 %
Platelets: 199 10*3/uL (ref 140–400)
RBC: 4.8 10*6/uL (ref 3.80–5.10)
RDW: 12.5 % (ref 11.0–15.0)
Total Lymphocyte: 38.3 %
WBC: 5.9 10*3/uL (ref 3.8–10.8)

## 2023-03-10 LAB — COMPLETE METABOLIC PANEL WITH GFR
AG Ratio: 1.4 (calc) (ref 1.0–2.5)
ALT: 19 U/L (ref 6–29)
AST: 18 U/L (ref 10–35)
Albumin: 4.4 g/dL (ref 3.6–5.1)
Alkaline phosphatase (APISO): 64 U/L (ref 37–153)
BUN: 11 mg/dL (ref 7–25)
CO2: 28 mmol/L (ref 20–32)
Calcium: 9.8 mg/dL (ref 8.6–10.4)
Chloride: 102 mmol/L (ref 98–110)
Creat: 0.82 mg/dL (ref 0.50–1.03)
Globulin: 3.1 g/dL (calc) (ref 1.9–3.7)
Glucose, Bld: 89 mg/dL (ref 65–99)
Potassium: 5 mmol/L (ref 3.5–5.3)
Sodium: 139 mmol/L (ref 135–146)
Total Bilirubin: 0.6 mg/dL (ref 0.2–1.2)
Total Protein: 7.5 g/dL (ref 6.1–8.1)
eGFR: 83 mL/min/{1.73_m2} (ref >=60)

## 2023-03-10 NOTE — Progress Notes (Signed)
CMP WNL

## 2023-04-05 ENCOUNTER — Other Ambulatory Visit: Payer: Self-pay | Admitting: Physician Assistant

## 2023-04-05 DIAGNOSIS — L4050 Arthropathic psoriasis, unspecified: Secondary | ICD-10-CM

## 2023-04-05 NOTE — Telephone Encounter (Signed)
Last Fill: 03/09/2023  Labs: 03/09/2023 CBC within normal limits. CMP WNL.  TB Gold: 05/14/2022  TB Gold is negative.   Next Visit: 05/19/2023  Last Visit: 12/07/2022  DX: Psoriatic arthritis   Current Dose per office note 12/07/2022: Cosentyx 300 mg sq injections every 4 weeks   Okay to refill Cosentyx?

## 2023-04-13 DIAGNOSIS — R151 Fecal smearing: Secondary | ICD-10-CM | POA: Diagnosis not present

## 2023-04-13 DIAGNOSIS — N3946 Mixed incontinence: Secondary | ICD-10-CM | POA: Diagnosis not present

## 2023-04-13 DIAGNOSIS — N3281 Overactive bladder: Secondary | ICD-10-CM | POA: Diagnosis not present

## 2023-04-13 DIAGNOSIS — N952 Postmenopausal atrophic vaginitis: Secondary | ICD-10-CM | POA: Diagnosis not present

## 2023-04-27 ENCOUNTER — Other Ambulatory Visit: Payer: Self-pay | Admitting: Physician Assistant

## 2023-04-27 DIAGNOSIS — L4050 Arthropathic psoriasis, unspecified: Secondary | ICD-10-CM

## 2023-04-27 NOTE — Telephone Encounter (Signed)
Last Fill: 04/05/2023  Labs: 03/09/2023  CMP WNL, CBC within normal limits   TB Gold: 6/15/2023TB Gold is negative.     Next Visit: 05/19/2023   Last Visit: 12/07/2022   ZO:[id number] arthritis   Current Dose per office note 12/07/2022 : Cosentyx 300 mg sq injections every 4 weeks   Okay to refill Cosentyx?

## 2023-05-05 NOTE — Progress Notes (Deleted)
Office Visit Note  [name]             Date of Birth: [birth year]           MRN: [id number]             PCP: Rodrigo Ran, MD Referring: Rodrigo Ran, MD Visit Date: 05/19/2023 Occupation: @GUAROCC @  Subjective:  No chief complaint on file.   History of Present Illness: Wendy [name] is a 57 y.o. female ***     Activities of Daily Living:  Patient reports morning stiffness for *** {minute/hour:19697}.   Patient {ACTIONS;DENIES/REPORTS:21021675::"Denies"} nocturnal pain.  Difficulty dressing/grooming: {ACTIONS;DENIES/REPORTS:21021675::"Denies"} Difficulty climbing stairs: {ACTIONS;DENIES/REPORTS:21021675::"Denies"} Difficulty getting out of chair: {ACTIONS;DENIES/REPORTS:21021675::"Denies"} Difficulty using hands for taps, buttons, cutlery, and/or writing: {ACTIONS;DENIES/REPORTS:21021675::"Denies"}  No Rheumatology ROS completed.   PMFS History:  Patient Active Problem List   Diagnosis Date Noted   Nonallopathic lesion of lumbosacral region 11/04/2018   Nonallopathic lesion of sacral region 11/04/2018   Nonallopathic lesion of thoracic region 11/04/2018   Psoriasis 07/05/2017   DJD (degenerative joint disease), cervical 02/03/2017   Spondylosis of lumbar region without myelopathy or radiculopathy 02/03/2017   High risk medication use 10/06/2016   Plantar fasciitis 10/06/2016   Achilles tendinitis of both lower extremities 10/06/2016   Vitamin D deficiency 10/06/2016   Primary osteoarthritis of both knees 10/06/2016   Primary osteoarthritis of both feet 10/06/2016   Chronic low back pain without sciatica 10/06/2016   Hypothyroidism 10/06/2016   Polyp of colon 10/06/2016   B12 deficiency 10/06/2016   Psoriatic arthropathy ([facility]) 04/11/2012   Anal fissure 04/11/2012    Past Medical History:  Diagnosis Date   Anal fissure 2004   Tx'd with diltiazem  gel by Dr. Kendrick Ranch   Arthritis    Bronchitis, acute 2008   Psoriatic arthritis ([facility])    Seasonal  allergies    Thyroid disease    URI 10/12/2007   Qualifier: Diagnosis of  By: Alwyn Ren MD, Barbee Shropshire History  Problem Relation Age of Onset   Hypertension Mother    Diabetes Mother    Liver disease Sister        liver transplant    Cirrhosis Sister        non-alcoholic    Other Sister        fatty liver   Past Surgical History:  Procedure Laterality Date   BREAST SURGERY  12/15/2017   breast reduction    Social History   Social History Narrative   Not on file   Immunization History  Administered Date(s) Administered   Moderna Sars-Covid-2 Vaccination 12/29/2019, 01/24/2020, 12/05/2020   Pneumococcal Polysaccharide-23 10/28/2010   Zoster Recombinat (Shingrix) 11/16/2018, 01/30/2019     Objective: Vital Signs: There were no vitals taken for this visit.   Physical Exam   Musculoskeletal Exam: ***  CDAI Exam: CDAI Score: -- Patient Global: --; Provider Global: -- Swollen: --; Tender: -- Joint Exam 05/19/2023   No joint exam has been documented for this visit   There is currently no information documented on the homunculus. Go to the Rheumatology activity and complete the homunculus joint exam.  Investigation: No additional findings.  Imaging: No results found.  Recent Labs: Lab Results  Component Value Date   WBC 5.9 03/09/2023   HGB 13.8 03/09/2023   PLT 199 03/09/2023   NA 139 03/09/2023   K 5.0 03/09/2023   CL 102 03/09/2023   CO2 28 03/09/2023   GLUCOSE 89  03/09/2023   BUN 11 03/09/2023   CREATININE 0.82 03/09/2023   BILITOT 0.6 03/09/2023   ALKPHOS 77 05/27/2017   AST 18 03/09/2023   ALT 19 03/09/2023   PROT 7.5 03/09/2023   ALBUMIN 3.9 05/27/2017   CALCIUM 9.8 03/09/2023   GFRAA 88 05/16/2021   QFTBGOLDPLUS NEGATIVE 05/14/2022    Speciality Comments: Prior therapy:  failed Humira, Enbrel, Otezla, intolerance to oral methotrexate and Arava Otrexup x1 dose and then discontinued  Procedures:  No procedures  performed Allergies: Pollen extract, Arava [leflunomide], and Methotrexate derivatives   Assessment / Plan:     Visit Diagnoses: Psoriatic arthritis ([facility])  Psoriasis  High risk medication use  Sacroiliac pain  Primary osteoarthritis of both knees  Primary osteoarthritis of both feet  DDD (degenerative disc disease), cervical  DDD (degenerative disc disease), lumbar  History of hypothyroidism  Vitamin D deficiency  Orders: No orders of the defined types were placed in this encounter.  No orders of the defined types were placed in this encounter.   Face-to-face time spent with patient was *** minutes. Greater than 50% of time was spent in counseling and coordination of care.  Follow-Up Instructions: No follow-ups on file.   Gearldine Bienenstock, PA-C  Note - This record has been created using Dragon software.  Chart creation errors have been sought, but may not always  have been located. Such creation errors do not reflect on  the standard of medical care.

## 2023-05-12 ENCOUNTER — Other Ambulatory Visit (HOSPITAL_BASED_OUTPATIENT_CLINIC_OR_DEPARTMENT_OTHER): Payer: Self-pay

## 2023-05-12 DIAGNOSIS — K760 Fatty (change of) liver, not elsewhere classified: Secondary | ICD-10-CM | POA: Diagnosis not present

## 2023-05-12 MED ORDER — WEGOVY 0.5 MG/0.5ML ~~LOC~~ SOAJ
0.5000 mg | SUBCUTANEOUS | 0 refills | Status: DC
  Filled 2023-05-12 – 2023-06-21 (×3): qty 2, 28d supply, fill #0

## 2023-05-18 NOTE — Progress Notes (Deleted)
Office Visit Note  [name]             Date of Birth: 25-May-1966           MRN: [id number]             PCP: Rodrigo Ran, MD Referring: Rodrigo Ran, MD Visit Date: 05/25/2023 Occupation: @GUAROCC @  Subjective:  No chief complaint on file.   History of Present Illness: Wendy [name] is a 57 y.o. female ***     Activities of Daily Living:  Patient reports morning stiffness for *** {minute/hour:19697}.   Patient {ACTIONS;DENIES/REPORTS:21021675::"Denies"} nocturnal pain.  Difficulty dressing/grooming: {ACTIONS;DENIES/REPORTS:21021675::"Denies"} Difficulty climbing stairs: {ACTIONS;DENIES/REPORTS:21021675::"Denies"} Difficulty getting out of chair: {ACTIONS;DENIES/REPORTS:21021675::"Denies"} Difficulty using hands for taps, buttons, cutlery, and/or writing: {ACTIONS;DENIES/REPORTS:21021675::"Denies"}  No Rheumatology ROS completed.   PMFS History:  Patient Active Problem List   Diagnosis Date Noted   Nonallopathic lesion of lumbosacral region 11/04/2018   Nonallopathic lesion of sacral region 11/04/2018   Nonallopathic lesion of thoracic region 11/04/2018   Psoriasis 07/05/2017   DJD (degenerative joint disease), cervical 02/03/2017   Spondylosis of lumbar region without myelopathy or radiculopathy 02/03/2017   High risk medication use 10/06/2016   Plantar fasciitis 10/06/2016   Achilles tendinitis of both lower extremities 10/06/2016   Vitamin D deficiency 10/06/2016   Primary osteoarthritis of both knees 10/06/2016   Primary osteoarthritis of both feet 10/06/2016   Chronic low back pain without sciatica 10/06/2016   Hypothyroidism 10/06/2016   Polyp of colon 10/06/2016   B12 deficiency 10/06/2016   Psoriatic arthropathy ([facility]) 04/11/2012   Anal fissure 04/11/2012    Past Medical History:  Diagnosis Date   Anal fissure 2004   Tx'd with diltiazem  gel by Dr. Kendrick Ranch   Arthritis    Bronchitis, acute 2008   Psoriatic arthritis ([facility])    Seasonal  allergies    Thyroid disease    URI 10/12/2007   Qualifier: Diagnosis of  By: Alwyn Ren MD, Barbee Shropshire History  Problem Relation Age of Onset   Hypertension Mother    Diabetes Mother    Liver disease Sister        liver transplant    Cirrhosis Sister        non-alcoholic    Other Sister        fatty liver   Past Surgical History:  Procedure Laterality Date   BREAST SURGERY  12/15/2017   breast reduction    Social History   Social History Narrative   Not on file   Immunization History  Administered Date(s) Administered   Moderna Sars-Covid-2 Vaccination 12/29/2019, 01/24/2020, 12/05/2020   Pneumococcal Polysaccharide-23 10/28/2010   Zoster Recombinat (Shingrix) 11/16/2018, 01/30/2019     Objective: Vital Signs: There were no vitals taken for this visit.   Physical Exam   Musculoskeletal Exam: ***  CDAI Exam: CDAI Score: -- Patient Global: --; Provider Global: -- Swollen: --; Tender: -- Joint Exam 05/25/2023   No joint exam has been documented for this visit   There is currently no information documented on the homunculus. Go to the Rheumatology activity and complete the homunculus joint exam.  Investigation: No additional findings.  Imaging: No results found.  Recent Labs: Lab Results  Component Value Date   WBC 5.9 03/09/2023   HGB 13.8 03/09/2023   PLT 199 03/09/2023   NA 139 03/09/2023   K 5.0 03/09/2023   CL 102 03/09/2023   CO2 28 03/09/2023   GLUCOSE 89  03/09/2023   BUN 11 03/09/2023   CREATININE 0.82 03/09/2023   BILITOT 0.6 03/09/2023   ALKPHOS 77 05/27/2017   AST 18 03/09/2023   ALT 19 03/09/2023   PROT 7.5 03/09/2023   ALBUMIN 3.9 05/27/2017   CALCIUM 9.8 03/09/2023   GFRAA 88 05/16/2021   QFTBGOLDPLUS NEGATIVE 05/14/2022    Speciality Comments: Prior therapy:  failed Humira, Enbrel, Otezla, intolerance to oral methotrexate and Arava Otrexup x1 dose and then discontinued  Procedures:  No procedures  performed Allergies: Pollen extract, Arava [leflunomide], and Methotrexate derivatives   Assessment / Plan:     Visit Diagnoses: No diagnosis found.  Orders: No orders of the defined types were placed in this encounter.  No orders of the defined types were placed in this encounter.   Face-to-face time spent with patient was *** minutes. Greater than 50% of time was spent in counseling and coordination of care.  Follow-Up Instructions: No follow-ups on file.   Ellen Henri, CMA  Note - This record has been created using Animal nutritionist.  Chart creation errors have been sought, but may not always  have been located. Such creation errors do not reflect on  the standard of medical care.

## 2023-05-19 ENCOUNTER — Ambulatory Visit: Payer: BC Managed Care – PPO | Admitting: Rheumatology

## 2023-05-19 ENCOUNTER — Ambulatory Visit: Payer: BC Managed Care – PPO | Admitting: Physician Assistant

## 2023-05-19 DIAGNOSIS — L409 Psoriasis, unspecified: Secondary | ICD-10-CM

## 2023-05-19 DIAGNOSIS — Z8639 Personal history of other endocrine, nutritional and metabolic disease: Secondary | ICD-10-CM

## 2023-05-19 DIAGNOSIS — E559 Vitamin D deficiency, unspecified: Secondary | ICD-10-CM

## 2023-05-19 DIAGNOSIS — M5136 Other intervertebral disc degeneration, lumbar region: Secondary | ICD-10-CM

## 2023-05-19 DIAGNOSIS — M19071 Primary osteoarthritis, right ankle and foot: Secondary | ICD-10-CM

## 2023-05-19 DIAGNOSIS — M533 Sacrococcygeal disorders, not elsewhere classified: Secondary | ICD-10-CM

## 2023-05-19 DIAGNOSIS — Z79899 Other long term (current) drug therapy: Secondary | ICD-10-CM

## 2023-05-19 DIAGNOSIS — L4050 Arthropathic psoriasis, unspecified: Secondary | ICD-10-CM

## 2023-05-19 DIAGNOSIS — M170 Bilateral primary osteoarthritis of knee: Secondary | ICD-10-CM

## 2023-05-19 DIAGNOSIS — M5030 Other cervical disc degeneration, unspecified cervical region: Secondary | ICD-10-CM

## 2023-05-24 ENCOUNTER — Other Ambulatory Visit (HOSPITAL_BASED_OUTPATIENT_CLINIC_OR_DEPARTMENT_OTHER): Payer: Self-pay

## 2023-05-25 ENCOUNTER — Other Ambulatory Visit (HOSPITAL_BASED_OUTPATIENT_CLINIC_OR_DEPARTMENT_OTHER): Payer: Self-pay

## 2023-05-25 ENCOUNTER — Ambulatory Visit: Payer: BC Managed Care – PPO | Admitting: Rheumatology

## 2023-05-25 DIAGNOSIS — M5136 Other intervertebral disc degeneration, lumbar region: Secondary | ICD-10-CM

## 2023-05-25 DIAGNOSIS — E559 Vitamin D deficiency, unspecified: Secondary | ICD-10-CM

## 2023-05-25 DIAGNOSIS — M533 Sacrococcygeal disorders, not elsewhere classified: Secondary | ICD-10-CM

## 2023-05-25 DIAGNOSIS — Z8639 Personal history of other endocrine, nutritional and metabolic disease: Secondary | ICD-10-CM

## 2023-05-25 DIAGNOSIS — M19072 Primary osteoarthritis, left ankle and foot: Secondary | ICD-10-CM

## 2023-05-25 DIAGNOSIS — L4050 Arthropathic psoriasis, unspecified: Secondary | ICD-10-CM

## 2023-05-25 DIAGNOSIS — M170 Bilateral primary osteoarthritis of knee: Secondary | ICD-10-CM

## 2023-05-25 DIAGNOSIS — Z79899 Other long term (current) drug therapy: Secondary | ICD-10-CM

## 2023-05-25 DIAGNOSIS — M5030 Other cervical disc degeneration, unspecified cervical region: Secondary | ICD-10-CM

## 2023-05-25 DIAGNOSIS — L409 Psoriasis, unspecified: Secondary | ICD-10-CM

## 2023-05-27 DIAGNOSIS — N3946 Mixed incontinence: Secondary | ICD-10-CM | POA: Diagnosis not present

## 2023-06-21 ENCOUNTER — Other Ambulatory Visit (HOSPITAL_BASED_OUTPATIENT_CLINIC_OR_DEPARTMENT_OTHER): Payer: Self-pay

## 2023-06-23 ENCOUNTER — Other Ambulatory Visit (HOSPITAL_BASED_OUTPATIENT_CLINIC_OR_DEPARTMENT_OTHER): Payer: Self-pay

## 2023-06-24 DIAGNOSIS — N952 Postmenopausal atrophic vaginitis: Secondary | ICD-10-CM | POA: Diagnosis not present

## 2023-06-24 DIAGNOSIS — Z124 Encounter for screening for malignant neoplasm of cervix: Secondary | ICD-10-CM | POA: Diagnosis not present

## 2023-06-24 DIAGNOSIS — F5101 Primary insomnia: Secondary | ICD-10-CM | POA: Diagnosis not present

## 2023-06-24 DIAGNOSIS — Z01411 Encounter for gynecological examination (general) (routine) with abnormal findings: Secondary | ICD-10-CM | POA: Diagnosis not present

## 2023-06-24 DIAGNOSIS — F418 Other specified anxiety disorders: Secondary | ICD-10-CM | POA: Diagnosis not present

## 2023-07-02 DIAGNOSIS — E039 Hypothyroidism, unspecified: Secondary | ICD-10-CM | POA: Diagnosis not present

## 2023-07-02 DIAGNOSIS — D509 Iron deficiency anemia, unspecified: Secondary | ICD-10-CM | POA: Diagnosis not present

## 2023-07-02 DIAGNOSIS — E559 Vitamin D deficiency, unspecified: Secondary | ICD-10-CM | POA: Diagnosis not present

## 2023-07-02 DIAGNOSIS — E538 Deficiency of other specified B group vitamins: Secondary | ICD-10-CM | POA: Diagnosis not present

## 2023-07-07 DIAGNOSIS — Z1212 Encounter for screening for malignant neoplasm of rectum: Secondary | ICD-10-CM | POA: Diagnosis not present

## 2023-07-07 DIAGNOSIS — D229 Melanocytic nevi, unspecified: Secondary | ICD-10-CM | POA: Diagnosis not present

## 2023-07-07 DIAGNOSIS — R82998 Other abnormal findings in urine: Secondary | ICD-10-CM | POA: Diagnosis not present

## 2023-07-07 DIAGNOSIS — Z0000 Encounter for general adult medical examination without abnormal findings: Secondary | ICD-10-CM | POA: Diagnosis not present

## 2023-07-12 NOTE — Progress Notes (Unsigned)
Office Visit Note  [name]             Date of Birth: 08-01-1966           MRN: [id number]             PCP: Rodrigo Ran, MD Referring: Rodrigo Ran, MD Visit Date: 07/13/2023 Occupation: @GUAROCC @  Subjective:  Patient management  History of Present Illness: Wendy [name] is a 57 y.o. female with psoriatic arthritis, psoriasis and osteoarthritis.  She states she has been under a lot of stress.  She has been having headaches and dizziness.  She recently started taking Wellbutrin.  She has any joint swelling.  She continues to have some stiffness in her cervical region.  She denies any psoriasis.  She has not had any recent episodes of plantar fasciitis, Achilles tendinitis or uveitis.    Activities of Daily Living:  Patient reports morning stiffness for 30-60 minutes.   Patient Denies nocturnal pain.  Difficulty dressing/grooming: Denies Difficulty climbing stairs: Denies Difficulty getting out of chair: Denies Difficulty using hands for taps, buttons, cutlery, and/or writing: Denies  Review of Systems  Constitutional:  Positive for fatigue.  HENT:  Positive for mouth dryness. Negative for mouth sores.   Eyes:  Negative for dryness.  Respiratory:  Negative for shortness of breath.   Cardiovascular:  Negative for chest pain and palpitations.  Gastrointestinal:  Negative for blood in stool, constipation and diarrhea.  Endocrine: Positive for increased urination.  Genitourinary:  Positive for involuntary urination.  Musculoskeletal:  Positive for morning stiffness. Negative for joint pain, gait problem, joint pain, joint swelling, myalgias, muscle weakness, muscle tenderness and myalgias.  Skin:  Positive for hair loss. Negative for color change, rash and sensitivity to sunlight.  Allergic/Immunologic: Positive for susceptible to infections.  Neurological:  Positive for dizziness and headaches.  Hematological:  Negative for swollen glands.  Psychiatric/Behavioral:   Positive for depressed mood and sleep disturbance. The patient is nervous/anxious.     PMFS History:  Patient Active Problem List   Diagnosis Date Noted   Nonallopathic lesion of lumbosacral region 11/04/2018   Nonallopathic lesion of sacral region 11/04/2018   Nonallopathic lesion of thoracic region 11/04/2018   Psoriasis 07/05/2017   DJD (degenerative joint disease), cervical 02/03/2017   Spondylosis of lumbar region without myelopathy or radiculopathy 02/03/2017   High risk medication use 10/06/2016   Plantar fasciitis 10/06/2016   Achilles tendinitis of both lower extremities 10/06/2016   Vitamin D deficiency 10/06/2016   Primary osteoarthritis of both knees 10/06/2016   Primary osteoarthritis of both feet 10/06/2016   Chronic low back pain without sciatica 10/06/2016   Hypothyroidism 10/06/2016   Polyp of colon 10/06/2016   B12 deficiency 10/06/2016   Psoriatic arthropathy (HCC) 04/11/2012   Anal fissure 04/11/2012    Past Medical History:  Diagnosis Date   Anal fissure 2004   Tx'd with diltiazem  gel by Dr. Kendrick Ranch   Arthritis    Bronchitis, acute 2008   Psoriatic arthritis (HCC)    Seasonal allergies    Thyroid disease    URI 10/12/2007   Qualifier: Diagnosis of  By: Alwyn Ren MD, Barbee Shropshire History  Problem Relation Age of Onset   Hypertension Mother    Diabetes Mother    Liver disease Sister        liver transplant    Cirrhosis Sister        non-alcoholic    Other Sister  fatty liver   Past Surgical History:  Procedure Laterality Date   BREAST SURGERY  12/15/2017   breast reduction    Social History   Social History Narrative   Not on file   Immunization History  Administered Date(s) Administered   Moderna Sars-Covid-2 Vaccination 12/29/2019, 01/24/2020, 12/05/2020   Pneumococcal Polysaccharide-23 10/28/2010   Zoster Recombinant(Shingrix) 11/16/2018, 01/30/2019     Objective: Vital Signs: BP (!) 144/100 (BP Location: Left Arm,  Patient Position: Sitting, Cuff Size: Normal)   Pulse 84   Resp 15   Ht 5\' 2"  (1.575 m)   Wt 150 lb 9.6 oz (68.3 kg)   BMI 27.55 kg/m    Physical Exam Vitals and nursing note reviewed.  Constitutional:      Appearance: She is well-developed.  HENT:     Head: Normocephalic and atraumatic.  Eyes:     Conjunctiva/sclera: Conjunctivae normal.  Cardiovascular:     Rate and Rhythm: Normal rate and regular rhythm.     Heart sounds: Normal heart sounds.  Pulmonary:     Effort: Pulmonary effort is normal.     Breath sounds: Normal breath sounds.  Abdominal:     General: Bowel sounds are normal.     Palpations: Abdomen is soft.  Musculoskeletal:     Cervical back: Normal range of motion.  Lymphadenopathy:     Cervical: No cervical adenopathy.  Skin:    General: Skin is warm and dry.     Capillary Refill: Capillary refill takes less than 2 seconds.  Neurological:     Mental Status: She is alert and oriented to person, place, and time.  Psychiatric:        Behavior: Behavior normal.      Musculoskeletal Exam: Cervical, thoracic and lumbar spine were in good range of motion.  She had no SI joint tenderness.  Shoulder joints, elbow joints, wrist joints, MCPs PIPs and DIPs with good range of motion with no synovitis.  Hip joints and knee joints in good range of motion.  She had no tenderness over ankles or MTPs.  No synovitis was noted.  CDAI Exam: CDAI Score: -- Patient Global: --; Provider Global: -- Swollen: --; Tender: -- Joint Exam 07/13/2023   No joint exam has been documented for this visit   There is currently no information documented on the homunculus. Go to the Rheumatology activity and complete the homunculus joint exam.  Investigation: No additional findings.  Imaging: No results found.  Recent Labs: Lab Results  Component Value Date   WBC 5.9 03/09/2023   HGB 13.8 03/09/2023   PLT 199 03/09/2023   NA 139 03/09/2023   K 5.0 03/09/2023   CL 102 03/09/2023    CO2 28 03/09/2023   GLUCOSE 89 03/09/2023   BUN 11 03/09/2023   CREATININE 0.82 03/09/2023   BILITOT 0.6 03/09/2023   ALKPHOS 77 05/27/2017   AST 18 03/09/2023   ALT 19 03/09/2023   PROT 7.5 03/09/2023   ALBUMIN 3.9 05/27/2017   CALCIUM 9.8 03/09/2023   GFRAA 88 05/16/2021   QFTBGOLDPLUS NEGATIVE 05/14/2022    Speciality Comments: Prior therapy:  failed Humira, Enbrel, Otezla, intolerance to oral methotrexate and Arava Otrexup x1 dose and then discontinued  Procedures:  No procedures performed Allergies: Pollen extract, Arava [leflunomide], and Methotrexate derivatives   Assessment / Plan:     Visit Diagnoses: Psoriatic arthritis (HCC) -patient denies having a flare of psoriatic arthritis.  No synovitis was noted on the examination.  She has been taking  Cosentyx on a regular basis without any interruption.  She denies any episodes of sacroiliitis, plantar fasciitis, Achilles tendinitis or uveitis.  Prescription refill for Cosentyx was given.  Plan: Secukinumab, 300 MG Dose, (COSENTYX SENSOREADY, 300 MG,) 150 MG/ML SOAJ  Psoriasis-no active psoriasis was noted.  High risk medication use - Cosentyx 300 mg sq injections every 4 weeks. -Labs obtained on March 09, 2023 were within normal limits.  She was advised to get labs every 3 months.  TB Gold was negative on May 14, 2022.  Will recheck TB Gold today.  Plan: CBC with Differential/Platelet, COMPLETE METABOLIC PANEL WITH GFR, QuantiFERON-TB Gold Plus.  Information about immunization was placed in the AVS.  She was advised to hold Cosyntex if she develops an infection resume after the infection resolves.  Sacroiliac pain -she has intermittent SI joint discomfort.  X-rays from 2021 showed mild SI joint to sclerosis and osteoarthritic changes.  Primary osteoarthritis of both knees-patient had no warmth swelling or effusion on the examination.  She had no tenderness on the examination today.  Primary osteoarthritis of both feet-patient  had no tenderness on the examination no synovitis was noted.  DDD (degenerative disc disease), cervical-patient continues to have stiffness and discomfort in her cervical spine.  She had bilateral trapezius spasm.  Was advised to use topical lidocaine patches.  DDD (degenerative disc disease), lumbar -she has been having lower back pain and discomfort.  She uses tizanidine 4 mg at bedtime as needed for muscle spasms.  Vitamin D deficiency-she is on vitamin D supplement.  History of hypothyroidism  Elevated blood pressure reading-blood pressure was elevated today at 138/102.  Repeat blood pressure was 144/100.  Patient states she has been a lot of stress and started Wellbutrin.  I advised her to get a blood pressure monitor and monitor blood pressure at home closely.  If her blood pressure stays elevated she will contact her PCP.  Stress-patient states that she is under a lot of stress.  She is having symptoms of anxiety and depression.  She was recently started on Wellbutrin.  Orders: Orders Placed This Encounter  Procedures   CBC with Differential/Platelet   COMPLETE METABOLIC PANEL WITH GFR   QuantiFERON-TB Gold Plus   Meds ordered this encounter  Medications   Secukinumab, 300 MG Dose, (COSENTYX SENSOREADY, 300 MG,) 150 MG/ML SOAJ    Sig: INJECT 2 PENS UNDER THE SKIN EVERY 4 WEEKS    Dispense:  6 mL    Refill:  0     Follow-Up Instructions: Return for Psoriatic arthritis, Osteoarthritis.   Pollyann Savoy, MD  Note - This record has been created using Animal nutritionist.  Chart creation errors have been sought, but may not always  have been located. Such creation errors do not reflect on  the standard of medical care.

## 2023-07-12 NOTE — Progress Notes (Deleted)
Office Visit Note  [name]             Date of Birth: [birth year]           MRN: [id number]             PCP: Rodrigo Ran, MD Referring: Rodrigo Ran, MD Visit Date: 07/21/2023 Occupation: @GUAROCC @  Subjective:  No chief complaint on file.   History of Present Illness: Wendy [name] is a 57 y.o. female ***     Activities of Daily Living:  Patient reports morning stiffness for *** {minute/hour:19697}.   Patient {ACTIONS;DENIES/REPORTS:21021675::"Denies"} nocturnal pain.  Difficulty dressing/grooming: {ACTIONS;DENIES/REPORTS:21021675::"Denies"} Difficulty climbing stairs: {ACTIONS;DENIES/REPORTS:21021675::"Denies"} Difficulty getting out of chair: {ACTIONS;DENIES/REPORTS:21021675::"Denies"} Difficulty using hands for taps, buttons, cutlery, and/or writing: {ACTIONS;DENIES/REPORTS:21021675::"Denies"}  No Rheumatology ROS completed.   PMFS History:  Patient Active Problem List   Diagnosis Date Noted   Nonallopathic lesion of lumbosacral region 11/04/2018   Nonallopathic lesion of sacral region 11/04/2018   Nonallopathic lesion of thoracic region 11/04/2018   Psoriasis 07/05/2017   DJD (degenerative joint disease), cervical 02/03/2017   Spondylosis of lumbar region without myelopathy or radiculopathy 02/03/2017   High risk medication use 10/06/2016   Plantar fasciitis 10/06/2016   Achilles tendinitis of both lower extremities 10/06/2016   Vitamin D deficiency 10/06/2016   Primary osteoarthritis of both knees 10/06/2016   Primary osteoarthritis of both feet 10/06/2016   Chronic low back pain without sciatica 10/06/2016   Hypothyroidism 10/06/2016   Polyp of colon 10/06/2016   B12 deficiency 10/06/2016   Psoriatic arthropathy (HCC) 04/11/2012   Anal fissure 04/11/2012    Past Medical History:  Diagnosis Date   Anal fissure 2004   Tx'd with diltiazem  gel by Dr. Kendrick Ranch   Arthritis    Bronchitis, acute 2008   Psoriatic arthritis (HCC)    Seasonal  allergies    Thyroid disease    URI 10/12/2007   Qualifier: Diagnosis of  By: Alwyn Ren MD, Barbee Shropshire History  Problem Relation Age of Onset   Hypertension Mother    Diabetes Mother    Liver disease Sister        liver transplant    Cirrhosis Sister        non-alcoholic    Other Sister        fatty liver   Past Surgical History:  Procedure Laterality Date   BREAST SURGERY  12/15/2017   breast reduction    Social History   Social History Narrative   Not on file   Immunization History  Administered Date(s) Administered   Moderna Sars-Covid-2 Vaccination 12/29/2019, 01/24/2020, 12/05/2020   Pneumococcal Polysaccharide-23 10/28/2010   Zoster Recombinant(Shingrix) 11/16/2018, 01/30/2019     Objective: Vital Signs: There were no vitals taken for this visit.   Physical Exam   Musculoskeletal Exam: ***  CDAI Exam: CDAI Score: -- Patient Global: --; Provider Global: -- Swollen: --; Tender: -- Joint Exam 07/21/2023   No joint exam has been documented for this visit   There is currently no information documented on the homunculus. Go to the Rheumatology activity and complete the homunculus joint exam.  Investigation: No additional findings.  Imaging: No results found.  Recent Labs: Lab Results  Component Value Date   WBC 5.9 03/09/2023   HGB 13.8 03/09/2023   PLT 199 03/09/2023   NA 139 03/09/2023   K 5.0 03/09/2023   CL 102 03/09/2023   CO2 28 03/09/2023   GLUCOSE 89 03/09/2023  BUN 11 03/09/2023   CREATININE 0.82 03/09/2023   BILITOT 0.6 03/09/2023   ALKPHOS 77 05/27/2017   AST 18 03/09/2023   ALT 19 03/09/2023   PROT 7.5 03/09/2023   ALBUMIN 3.9 05/27/2017   CALCIUM 9.8 03/09/2023   GFRAA 88 05/16/2021   QFTBGOLDPLUS NEGATIVE 05/14/2022    Speciality Comments: Prior therapy:  failed Humira, Enbrel, Otezla, intolerance to oral methotrexate and Arava Otrexup x1 dose and then discontinued  Procedures:  No procedures  performed Allergies: Pollen extract, Arava [leflunomide], and Methotrexate derivatives   Assessment / Plan:     Visit Diagnoses: No diagnosis found.  Orders: No orders of the defined types were placed in this encounter.  No orders of the defined types were placed in this encounter.   Face-to-face time spent with patient was *** minutes. Greater than 50% of time was spent in counseling and coordination of care.  Follow-Up Instructions: No follow-ups on file.   Ellen Henri, CMA  Note - This record has been created using Animal nutritionist.  Chart creation errors have been sought, but may not always  have been located. Such creation errors do not reflect on  the standard of medical care.

## 2023-07-13 ENCOUNTER — Ambulatory Visit: Payer: BC Managed Care – PPO | Attending: Rheumatology | Admitting: Rheumatology

## 2023-07-13 ENCOUNTER — Encounter: Payer: Self-pay | Admitting: Rheumatology

## 2023-07-13 VITALS — BP 144/100 | HR 84 | Resp 15 | Ht 62.0 in | Wt 150.6 lb

## 2023-07-13 DIAGNOSIS — L409 Psoriasis, unspecified: Secondary | ICD-10-CM | POA: Diagnosis not present

## 2023-07-13 DIAGNOSIS — E559 Vitamin D deficiency, unspecified: Secondary | ICD-10-CM

## 2023-07-13 DIAGNOSIS — L4050 Arthropathic psoriasis, unspecified: Secondary | ICD-10-CM | POA: Diagnosis not present

## 2023-07-13 DIAGNOSIS — M533 Sacrococcygeal disorders, not elsewhere classified: Secondary | ICD-10-CM

## 2023-07-13 DIAGNOSIS — Z79899 Other long term (current) drug therapy: Secondary | ICD-10-CM | POA: Diagnosis not present

## 2023-07-13 DIAGNOSIS — M19071 Primary osteoarthritis, right ankle and foot: Secondary | ICD-10-CM

## 2023-07-13 DIAGNOSIS — Z8639 Personal history of other endocrine, nutritional and metabolic disease: Secondary | ICD-10-CM

## 2023-07-13 DIAGNOSIS — M5136 Other intervertebral disc degeneration, lumbar region: Secondary | ICD-10-CM

## 2023-07-13 DIAGNOSIS — M19072 Primary osteoarthritis, left ankle and foot: Secondary | ICD-10-CM

## 2023-07-13 DIAGNOSIS — F439 Reaction to severe stress, unspecified: Secondary | ICD-10-CM

## 2023-07-13 DIAGNOSIS — R030 Elevated blood-pressure reading, without diagnosis of hypertension: Secondary | ICD-10-CM

## 2023-07-13 DIAGNOSIS — M170 Bilateral primary osteoarthritis of knee: Secondary | ICD-10-CM

## 2023-07-13 DIAGNOSIS — M5030 Other cervical disc degeneration, unspecified cervical region: Secondary | ICD-10-CM

## 2023-07-13 LAB — CBC WITH DIFFERENTIAL/PLATELET
Absolute Monocytes: 429 cells/uL (ref 200–950)
Basophils Absolute: 61 cells/uL (ref 0–200)
Basophils Relative: 1.1 %
Eosinophils Absolute: 132 cells/uL (ref 15–500)
Eosinophils Relative: 2.4 %
HCT: 42.7 % (ref 35.0–45.0)
Hemoglobin: 13.9 g/dL (ref 11.7–15.5)
Lymphs Abs: 1496 cells/uL (ref 850–3900)
MCH: 28.8 pg (ref 27.0–33.0)
MCHC: 32.6 g/dL (ref 32.0–36.0)
MCV: 88.4 fL (ref 80.0–100.0)
MPV: 12.2 fL (ref 7.5–12.5)
Monocytes Relative: 7.8 %
Neutro Abs: 3383 cells/uL (ref 1500–7800)
Neutrophils Relative %: 61.5 %
Platelets: 201 10*3/uL (ref 140–400)
RBC: 4.83 10*6/uL (ref 3.80–5.10)
RDW: 12.4 % (ref 11.0–15.0)
Total Lymphocyte: 27.2 %
WBC: 5.5 10*3/uL (ref 3.8–10.8)

## 2023-07-13 MED ORDER — COSENTYX SENSOREADY (300 MG) 150 MG/ML ~~LOC~~ SOAJ
SUBCUTANEOUS | 0 refills | Status: DC
Start: 2023-07-13 — End: 2023-11-30

## 2023-07-13 NOTE — Patient Instructions (Signed)
Standing Labs We placed an order today for your standing lab work.   Please have your standing labs drawn in November and every 3 months  Please have your labs drawn 2 weeks prior to your appointment so that the provider can discuss your lab results at your appointment, if possible.  Please note that you may see your imaging and lab results in MyChart before we have reviewed them. We will contact you once all results are reviewed. Please allow our office up to 72 hours to thoroughly review all of the results before contacting the office for clarification of your results.  WALK-IN LAB HOURS  Monday through Thursday from 8:00 am -12:30 pm and 1:00 pm-5:00 pm and Friday from 8:00 am-12:00 pm.  Patients with office visits requiring labs will be seen before walk-in labs.  You may encounter longer than normal wait times. Please allow additional time. Wait times may be shorter on  Monday and Thursday afternoons.  We do not book appointments for walk-in labs. We appreciate your patience and understanding with our staff.   Labs are drawn by [facility]. Please bring your co-pay at the time of your lab draw.  You may receive a bill from [facility] for your lab work.  Please note if you are on Hydroxychloroquine and and an order has been placed for a Hydroxychloroquine level,  you will need to have it drawn 4 hours or more after your last dose.  If you wish to have your labs drawn at another location, please call the office 24 hours in advance so we can fax the orders.  The office is located at [address], [place], [place] [address]   If you have any questions regarding directions or hours of operation,  please call [phone].   As a reminder, please drink plenty of water prior to coming for your lab work. Thanks!   Vaccines You are taking a medication(s) that can suppress your immune system.  The following immunizations are recommended: Flu annually Covid-19  Recipe Td/Tdap (tetanus,  diphtheria, pertussis) every 10 years Pneumonia (Prevnar 15 then Pneumovax 23 at least 1 year apart.  Alternatively, can take Prevnar 20 without needing additional dose) Shingrix: 2 doses from 4 weeks to 6 months apart  Please check with your PCP to make sure you are up to date. If you have signs or symptoms of an infection or start antibiotics: First, call your PCP for workup of your infection. Hold your medication through the infection, until you complete your antibiotics, and until symptoms resolve if you take the following: Injectable medication (Actemra, Benlysta, Cimzia, Cosentyx, Enbrel, Humira, Kevzara, Orencia, Remicade, Simponi, Stelara, Taltz, Tremfya) Methotrexate Leflunomide (Arava) Mycophenolate (Cellcept) Harriette Ohara, Olumiant, or Rinvoq

## 2023-07-15 ENCOUNTER — Other Ambulatory Visit (HOSPITAL_BASED_OUTPATIENT_CLINIC_OR_DEPARTMENT_OTHER): Payer: Self-pay

## 2023-07-15 DIAGNOSIS — R7309 Other abnormal glucose: Secondary | ICD-10-CM | POA: Diagnosis not present

## 2023-07-15 DIAGNOSIS — E039 Hypothyroidism, unspecified: Secondary | ICD-10-CM | POA: Diagnosis not present

## 2023-07-15 DIAGNOSIS — E559 Vitamin D deficiency, unspecified: Secondary | ICD-10-CM | POA: Diagnosis not present

## 2023-07-16 ENCOUNTER — Other Ambulatory Visit (HOSPITAL_BASED_OUTPATIENT_CLINIC_OR_DEPARTMENT_OTHER): Payer: Self-pay

## 2023-07-16 MED ORDER — WEGOVY 1 MG/0.5ML ~~LOC~~ SOAJ
1.0000 mg | SUBCUTANEOUS | 1 refills | Status: DC
  Filled 2023-07-16: qty 2, 28d supply, fill #0
  Filled 2023-10-26: qty 2, 28d supply, fill #1

## 2023-07-16 MED ORDER — WEGOVY 0.5 MG/0.5ML ~~LOC~~ SOAJ
0.5000 mg | SUBCUTANEOUS | 0 refills | Status: DC
  Filled 2023-10-05: qty 2, 28d supply, fill #0

## 2023-07-16 MED ORDER — WEGOVY 0.5 MG/0.5ML ~~LOC~~ SOAJ
0.5000 mg | SUBCUTANEOUS | 0 refills | Status: DC
  Filled 2023-07-16: qty 2, 28d supply, fill #0

## 2023-07-16 NOTE — Progress Notes (Signed)
CBC and CMP normal.  TB Gold negative.

## 2023-07-21 ENCOUNTER — Ambulatory Visit: Payer: BC Managed Care – PPO | Admitting: Rheumatology

## 2023-07-21 ENCOUNTER — Other Ambulatory Visit (HOSPITAL_BASED_OUTPATIENT_CLINIC_OR_DEPARTMENT_OTHER): Payer: Self-pay

## 2023-07-21 DIAGNOSIS — E039 Hypothyroidism, unspecified: Secondary | ICD-10-CM | POA: Diagnosis not present

## 2023-07-21 DIAGNOSIS — E559 Vitamin D deficiency, unspecified: Secondary | ICD-10-CM | POA: Diagnosis not present

## 2023-09-22 DIAGNOSIS — E538 Deficiency of other specified B group vitamins: Secondary | ICD-10-CM | POA: Diagnosis not present

## 2023-09-22 DIAGNOSIS — R112 Nausea with vomiting, unspecified: Secondary | ICD-10-CM | POA: Diagnosis not present

## 2023-09-30 ENCOUNTER — Other Ambulatory Visit (HOSPITAL_BASED_OUTPATIENT_CLINIC_OR_DEPARTMENT_OTHER): Payer: Self-pay

## 2023-10-04 DIAGNOSIS — R519 Headache, unspecified: Secondary | ICD-10-CM | POA: Diagnosis not present

## 2023-10-04 DIAGNOSIS — N3592 Unspecified urethral stricture, female: Secondary | ICD-10-CM | POA: Diagnosis not present

## 2023-10-04 DIAGNOSIS — N3946 Mixed incontinence: Secondary | ICD-10-CM | POA: Diagnosis not present

## 2023-10-04 DIAGNOSIS — N393 Stress incontinence (female) (male): Secondary | ICD-10-CM | POA: Diagnosis not present

## 2023-10-04 DIAGNOSIS — Z79890 Hormone replacement therapy (postmenopausal): Secondary | ICD-10-CM | POA: Diagnosis not present

## 2023-10-04 DIAGNOSIS — Z79899 Other long term (current) drug therapy: Secondary | ICD-10-CM | POA: Diagnosis not present

## 2023-10-05 ENCOUNTER — Other Ambulatory Visit (HOSPITAL_BASED_OUTPATIENT_CLINIC_OR_DEPARTMENT_OTHER): Payer: Self-pay

## 2023-10-26 ENCOUNTER — Other Ambulatory Visit (HOSPITAL_BASED_OUTPATIENT_CLINIC_OR_DEPARTMENT_OTHER): Payer: Self-pay

## 2023-10-27 DIAGNOSIS — E559 Vitamin D deficiency, unspecified: Secondary | ICD-10-CM | POA: Diagnosis not present

## 2023-10-27 DIAGNOSIS — E039 Hypothyroidism, unspecified: Secondary | ICD-10-CM | POA: Diagnosis not present

## 2023-11-18 ENCOUNTER — Other Ambulatory Visit (HOSPITAL_BASED_OUTPATIENT_CLINIC_OR_DEPARTMENT_OTHER): Payer: Self-pay

## 2023-11-18 DIAGNOSIS — N393 Stress incontinence (female) (male): Secondary | ICD-10-CM | POA: Diagnosis not present

## 2023-11-18 MED ORDER — WEGOVY 1.7 MG/0.75ML ~~LOC~~ SOAJ
1.7000 mg | SUBCUTANEOUS | 1 refills | Status: DC
  Filled 2023-11-19: qty 3, 28d supply, fill #0
  Filled 2023-12-24: qty 3, 28d supply, fill #1

## 2023-11-19 ENCOUNTER — Other Ambulatory Visit (HOSPITAL_BASED_OUTPATIENT_CLINIC_OR_DEPARTMENT_OTHER): Payer: Self-pay

## 2023-11-29 ENCOUNTER — Other Ambulatory Visit: Payer: Self-pay | Admitting: Rheumatology

## 2023-11-29 DIAGNOSIS — L4050 Arthropathic psoriasis, unspecified: Secondary | ICD-10-CM

## 2023-11-30 NOTE — Telephone Encounter (Signed)
 Last Fill: 07/13/2023  Labs: 07/13/2023 CBC and CMP normal.     TB Gold: 07/13/2023 Neg    Next Visit: 12/15/2023  Last Visit: 07/13/2023  IK:Ednmpjupr arthritis   Current Dose per office note 07/13/2023: Cosentyx  300 mg sq injections every 4 weeks.   Patient to update her labs at upcoming appointment on 12/15/2023  Okay to refill Cosentyx ?

## 2023-12-01 NOTE — Progress Notes (Deleted)
 Office Visit Note  [name]             Date of Birth: November 04, 1966           MRN: [id number]             PCP: Aldo Hun, MD Referring: Aldo Hun, MD Visit Date: 12/15/2023 Occupation: @GUAROCC @  Subjective:  No chief complaint on file.   History of Present Illness: Wendy [name] is a 58 y.o. female ***     Activities of Daily Living:  Patient reports morning stiffness for *** {minute/hour:19697}.   Patient {ACTIONS;DENIES/REPORTS:21021675::"Denies"} nocturnal pain.  Difficulty dressing/grooming: {ACTIONS;DENIES/REPORTS:21021675::"Denies"} Difficulty climbing stairs: {ACTIONS;DENIES/REPORTS:21021675::"Denies"} Difficulty getting out of chair: {ACTIONS;DENIES/REPORTS:21021675::"Denies"} Difficulty using hands for taps, buttons, cutlery, and/or writing: {ACTIONS;DENIES/REPORTS:21021675::"Denies"}  No Rheumatology ROS completed.   PMFS History:  Patient Active Problem List   Diagnosis Date Noted   Nonallopathic lesion of lumbosacral region 11/04/2018   Nonallopathic lesion of sacral region 11/04/2018   Nonallopathic lesion of thoracic region 11/04/2018   Psoriasis 07/05/2017   DJD (degenerative joint disease), cervical 02/03/2017   Spondylosis of lumbar region without myelopathy or radiculopathy 02/03/2017   High risk medication use 10/06/2016   Plantar fasciitis 10/06/2016   Achilles tendinitis of both lower extremities 10/06/2016   Vitamin D  deficiency 10/06/2016   Primary osteoarthritis of both knees 10/06/2016   Primary osteoarthritis of both feet 10/06/2016   Chronic low back pain without sciatica 10/06/2016   Hypothyroidism 10/06/2016   Polyp of colon 10/06/2016   B12 deficiency 10/06/2016   Psoriatic arthropathy (HCC) 04/11/2012   Anal fissure 04/11/2012    Past Medical History:  Diagnosis Date   Anal fissure 2004   Tx'd with diltiazem  gel by Dr. Dellie Fergusson   Arthritis    Bronchitis, acute 2008   Psoriatic arthritis (HCC)    Seasonal  allergies    Thyroid  disease    URI 10/12/2007   Qualifier: Diagnosis of  By: Donnice Gale MD, Corinthia Dickinson History  Problem Relation Age of Onset   Hypertension Mother    Diabetes Mother    Liver disease Sister        liver transplant    Cirrhosis Sister        non-alcoholic    Other Sister        fatty liver   Past Surgical History:  Procedure Laterality Date   BREAST SURGERY  12/15/2017   breast reduction    Social History   Social History Narrative   Not on file   Immunization History  Administered Date(s) Administered   Moderna Sars-Covid-2 Vaccination 12/29/2019, 01/24/2020, 12/05/2020   Pneumococcal Polysaccharide-23 10/28/2010   Zoster Recombinant(Shingrix) 11/16/2018, 01/30/2019     Objective: Vital Signs: There were no vitals taken for this visit.   Physical Exam   Musculoskeletal Exam: ***  CDAI Exam: CDAI Score: -- Patient Global: --; Provider Global: -- Swollen: --; Tender: -- Joint Exam 12/15/2023   No joint exam has been documented for this visit   There is currently no information documented on the homunculus. Go to the Rheumatology activity and complete the homunculus joint exam.  Investigation: No additional findings.  Imaging: No results found.  Recent Labs: Lab Results  Component Value Date   WBC 5.5 07/13/2023   HGB 13.9 07/13/2023   PLT 201 07/13/2023   NA 139 07/13/2023   K 4.0 07/13/2023   CL 101 07/13/2023   CO2 30 07/13/2023   GLUCOSE 88 07/13/2023  BUN 10 07/13/2023   CREATININE 0.82 07/13/2023   BILITOT 0.6 07/13/2023   ALKPHOS 77 05/27/2017   AST 17 07/13/2023   ALT 16 07/13/2023   PROT 8.0 07/13/2023   ALBUMIN 3.9 05/27/2017   CALCIUM 10.0 07/13/2023   GFRAA 88 05/16/2021   QFTBGOLDPLUS NEGATIVE 07/13/2023    Speciality Comments: Prior therapy:  failed Humira, Enbrel, Otezla, intolerance to oral methotrexate  and Arava Otrexup  x1 dose and then discontinued  Procedures:  No procedures  performed Allergies: Pollen extract, Arava [leflunomide], and Methotrexate  derivatives   Assessment / Plan:     Visit Diagnoses: Psoriatic arthritis (HCC)  Psoriasis  High risk medication use  Sacroiliac pain  Primary osteoarthritis of both knees  Primary osteoarthritis of both feet  DDD (degenerative disc disease), cervical  Degeneration of intervertebral disc of lumbar region without discogenic back pain or lower extremity pain  Vitamin D  deficiency  History of hypothyroidism  Orders: No orders of the defined types were placed in this encounter.  No orders of the defined types were placed in this encounter.   Face-to-face time spent with patient was *** minutes. Greater than 50% of time was spent in counseling and coordination of care.  Follow-Up Instructions: No follow-ups on file.   Romayne Clubs, PA-C  Note - This record has been created using Dragon software.  Chart creation errors have been sought, but may not always  have been located. Such creation errors do not reflect on  the standard of medical care.

## 2023-12-15 ENCOUNTER — Ambulatory Visit: Payer: BC Managed Care – PPO | Admitting: Rheumatology

## 2023-12-15 DIAGNOSIS — M5030 Other cervical disc degeneration, unspecified cervical region: Secondary | ICD-10-CM

## 2023-12-15 DIAGNOSIS — L4050 Arthropathic psoriasis, unspecified: Secondary | ICD-10-CM

## 2023-12-15 DIAGNOSIS — Z79899 Other long term (current) drug therapy: Secondary | ICD-10-CM

## 2023-12-15 DIAGNOSIS — E559 Vitamin D deficiency, unspecified: Secondary | ICD-10-CM

## 2023-12-15 DIAGNOSIS — M533 Sacrococcygeal disorders, not elsewhere classified: Secondary | ICD-10-CM

## 2023-12-15 DIAGNOSIS — L409 Psoriasis, unspecified: Secondary | ICD-10-CM

## 2023-12-15 DIAGNOSIS — M51369 Other intervertebral disc degeneration, lumbar region without mention of lumbar back pain or lower extremity pain: Secondary | ICD-10-CM

## 2023-12-15 DIAGNOSIS — M19071 Primary osteoarthritis, right ankle and foot: Secondary | ICD-10-CM

## 2023-12-15 DIAGNOSIS — Z8639 Personal history of other endocrine, nutritional and metabolic disease: Secondary | ICD-10-CM

## 2023-12-15 DIAGNOSIS — M170 Bilateral primary osteoarthritis of knee: Secondary | ICD-10-CM

## 2023-12-15 NOTE — Progress Notes (Signed)
Office Visit Note  [name]             Date of Birth: [birth year]           MRN: [id number]             PCP: Rodrigo Ran, MD Referring: Rodrigo Ran, MD Visit Date: 12/16/2023 Occupation: @GUAROCC @  Subjective:  Medication management  History of Present Illness: DEEANN KATONA is a 58 y.o. female with psoriatic arthritis psoriasis and osteoarthritis.  Patient states her stress level has been better now.  She has been on semaglutide for the last few months and had significant weight loss.  She is experiencing dry mouth and dry eyes since she has been on semaglutide.  She is also has been experiencing hair loss.  She has not had a flare of psoriasis or psoriatic arthritis.  She states her knee joint and lower back discomfort is improved with the weight loss.  She denies any history of uveitis, plantar fasciitis, Achilles tendinitis or inflammatory arthritis.  There is no history of psoriasis flare.  She has been taking Cosentyx 300 mg every 4 weeks on a regular basis without any interruption.  She has been experiencing increased fatigue.    Activities of Daily Living:  Patient reports morning stiffness for 15-20 minute.   Patient Denies nocturnal pain.  Difficulty dressing/grooming: Denies Difficulty climbing stairs: Denies Difficulty getting out of chair: Denies Difficulty using hands for taps, buttons, cutlery, and/or writing: Reports  Review of Systems  Constitutional:  Positive for fatigue.  HENT:  Positive for mouth dryness. Negative for mouth sores.   Eyes:  Positive for dryness.  Respiratory:  Negative for shortness of breath.   Cardiovascular:  Negative for chest pain and palpitations.  Gastrointestinal:  Negative for blood in stool, constipation and diarrhea.  Endocrine: Positive for increased urination.  Genitourinary:  Positive for involuntary urination.  Musculoskeletal:  Positive for joint pain, joint pain, myalgias, morning stiffness and myalgias. Negative  for gait problem, joint swelling, muscle weakness and muscle tenderness.  Skin:  Positive for hair loss. Negative for color change, rash and sensitivity to sunlight.  Allergic/Immunologic: Positive for susceptible to infections.  Neurological:  Negative for dizziness and headaches.  Hematological:  Negative for swollen glands.  Psychiatric/Behavioral:  Positive for depressed mood and sleep disturbance. The patient is nervous/anxious.     PMFS History:  Patient Active Problem List   Diagnosis Date Noted   Nonallopathic lesion of lumbosacral region 11/04/2018   Nonallopathic lesion of sacral region 11/04/2018   Nonallopathic lesion of thoracic region 11/04/2018   Psoriasis 07/05/2017   DJD (degenerative joint disease), cervical 02/03/2017   Spondylosis of lumbar region without myelopathy or radiculopathy 02/03/2017   High risk medication use 10/06/2016   Plantar fasciitis 10/06/2016   Achilles tendinitis of both lower extremities 10/06/2016   Vitamin D deficiency 10/06/2016   Primary osteoarthritis of both knees 10/06/2016   Primary osteoarthritis of both feet 10/06/2016   Chronic low back pain without sciatica 10/06/2016   Hypothyroidism 10/06/2016   Polyp of colon 10/06/2016   B12 deficiency 10/06/2016   Psoriatic arthropathy (HCC) 04/11/2012   Anal fissure 04/11/2012    Past Medical History:  Diagnosis Date   Anal fissure 2004   Tx'd with diltiazem  gel by Dr. Kendrick Ranch   Arthritis    Bronchitis, acute 2008   Psoriatic arthritis (HCC)    Seasonal allergies    Thyroid disease    URI 10/12/2007  Qualifier: Diagnosis of  By: Alwyn Ren MD, Barbee [place] History  Problem Relation Age of Onset   Hypertension Mother    Diabetes Mother    Liver disease Sister        liver transplant    Cirrhosis Sister        non-alcoholic    Other Sister        fatty liver   Past Surgical History:  Procedure Laterality Date   BREAST SURGERY  12/15/2017   breast reduction     Social History   Social History Narrative   Not on file   Immunization History  Administered Date(s) Administered   Moderna Sars-Covid-2 Vaccination 12/29/2019, 01/24/2020, 12/05/2020   Pneumococcal Polysaccharide-23 10/28/2010   Zoster Recombinant(Shingrix) 11/16/2018, 01/30/2019     Objective: Vital Signs: BP 113/76 (BP Location: Left Arm, Patient Position: Sitting, Cuff Size: Normal)   Pulse 73   Resp 14   Ht 5\' 2"  (1.575 m)   Wt 135 lb (61.2 kg)   BMI 24.69 kg/m    Physical Exam Vitals and nursing note reviewed.  Constitutional:      Appearance: She is well-developed.  HENT:     Head: Normocephalic and atraumatic.  Eyes:     Conjunctiva/sclera: Conjunctivae normal.  Cardiovascular:     Rate and Rhythm: Normal rate and regular rhythm.     Heart sounds: Normal heart sounds.  Pulmonary:     Effort: Pulmonary effort is normal.     Breath sounds: Normal breath sounds.  Abdominal:     General: Bowel sounds are normal.     Palpations: Abdomen is soft.  Musculoskeletal:     Cervical back: Normal range of motion.  Lymphadenopathy:     Cervical: No cervical adenopathy.  Skin:    General: Skin is warm and dry.     Capillary Refill: Capillary refill takes less than 2 seconds.  Neurological:     Mental Status: She is alert and oriented to person, place, and time.  Psychiatric:        Behavior: Behavior normal.      Musculoskeletal Exam: Cervical, thoracic and lumbar spine 1 good range of motion.  Shoulders, elbows, wrist joints, MCPs PIPs and DIPs with good range of motion with no synovitis.  Hip joints, knee joints, ankles, MTPs and PIPs were in good range of motion with no synovitis.  No plantar fasciitis or Achilles tendinitis was noted.  CDAI Exam: CDAI Score: -- Patient Global: --; Provider Global: -- Swollen: --; Tender: -- Joint Exam 12/16/2023   No joint exam has been documented for this visit   There is currently no information documented on the  homunculus. Go to the Rheumatology activity and complete the homunculus joint exam.  Investigation: No additional findings.  Imaging: No results found.  Recent Labs: Lab Results  Component Value Date   WBC 5.5 07/13/2023   HGB 13.9 07/13/2023   PLT 201 07/13/2023   NA 139 07/13/2023   K 4.0 07/13/2023   CL 101 07/13/2023   CO2 30 07/13/2023   GLUCOSE 88 07/13/2023   BUN 10 07/13/2023   CREATININE 0.82 07/13/2023   BILITOT 0.6 07/13/2023   ALKPHOS 77 05/27/2017   AST 17 07/13/2023   ALT 16 07/13/2023   PROT 8.0 07/13/2023   ALBUMIN 3.9 05/27/2017   CALCIUM 10.0 07/13/2023   GFRAA 88 05/16/2021   QFTBGOLDPLUS NEGATIVE 07/13/2023    Speciality Comments: Prior therapy:  failed Humira, Enbrel, Otezla, intolerance to  oral methotrexate and Arava Otrexup x1 dose and then discontinued  Procedures:  No procedures performed Allergies: Pollen extract, Arava [leflunomide], and Methotrexate derivatives   Assessment / Plan:     Visit Diagnoses: Psoriatic arthritis (HCC)-she has not had a flare of psoriatic arthritis since the last visit.  She continues to have some joint stiffness which is manageable.  No synovitis was noted on the examination.  She is denies history of Achilles tendinitis, plantar fasciitis or dactylitis.  Psoriasis-she has no active psoriasis lesions.  High risk medication use - Cosentyx 300 mg sq injections every 4 weeks -patient denies any interruption in the treatment.  Labs obtained on July 13, 2023 CBC and CMP were normal.  She had elevated LFTs in the past which has been normal for the last 2 years.  TB Gold was negative.  Will check labs today.  Plan: CBC with Differential/Platelet, COMPLETE METABOLIC PANEL WITH GFR.  She was advised to get labs every 3 months.  Information on immunization was placed in the AVS.  She was advised to hold Cosyntex if she develops an infection resume after the infection resolves.  Sacroiliac pain -she had no tenderness on the  examination today.  Patient states she gets a stiffness intermittently.  X-rays from 2021 showed mild SI joint to sclerosis and osteoarthritic changes.  Primary osteoarthritis of both knees-she has noticed improvement in her knee joint pain since she has had intentional weight loss.  Primary osteoarthritis of both feet-she had no discomfort in her feet today.  DDD (degenerative disc disease), cervical-she had good range of motion of the cervical spine without discomfort.  Lumbar spondylosis -she has intermittent discomfort in her lower back.  She had good mobility without any point tenderness.  She takes tizanidine 4 mg p.o. nightly as needed muscle spasms.  Vitamin D deficiency -she has history of vitamin D deficiency.  She has been experiencing increased fatigue and hair loss.  Plan: VITAMIN D 25 Hydroxy (Vit-D Deficiency, Fractures)  History of hypothyroidism-she is on Synthroid.  Stress -she is on Wellbutrin for anxiety and depression  Dyslipidemia - Plan: Lipid panel  Other fatigue -she has been experiencing increased fatigue.  Plan: TSH, Vitamin B12, Iron, TIBC and Ferritin Panel  Weight loss-patient had intentional weight loss on semaglutide.  Orders: Orders Placed This Encounter  Procedures   CBC with Differential/Platelet   COMPLETE METABOLIC PANEL WITH GFR   Lipid panel   TSH   VITAMIN D 25 Hydroxy (Vit-D Deficiency, Fractures)   Vitamin B12   Iron, TIBC and Ferritin Panel   No orders of the defined types were placed in this encounter.    Follow-Up Instructions: Return in about 5 months (around 05/15/2024) for Psoriatic arthritis.   Pollyann Savoy, MD  Note - This record has been created using Animal nutritionist.  Chart creation errors have been sought, but may not always  have been located. Such creation errors do not reflect on  the standard of medical care.

## 2023-12-16 ENCOUNTER — Ambulatory Visit: Payer: BC Managed Care – PPO | Attending: Rheumatology | Admitting: Rheumatology

## 2023-12-16 ENCOUNTER — Encounter: Payer: Self-pay | Admitting: Rheumatology

## 2023-12-16 VITALS — BP 113/76 | HR 73 | Resp 14 | Ht 62.0 in | Wt 135.0 lb

## 2023-12-16 DIAGNOSIS — M170 Bilateral primary osteoarthritis of knee: Secondary | ICD-10-CM

## 2023-12-16 DIAGNOSIS — L659 Nonscarring hair loss, unspecified: Secondary | ICD-10-CM

## 2023-12-16 DIAGNOSIS — E559 Vitamin D deficiency, unspecified: Secondary | ICD-10-CM

## 2023-12-16 DIAGNOSIS — R5383 Other fatigue: Secondary | ICD-10-CM | POA: Diagnosis not present

## 2023-12-16 DIAGNOSIS — M19072 Primary osteoarthritis, left ankle and foot: Secondary | ICD-10-CM

## 2023-12-16 DIAGNOSIS — Z79899 Other long term (current) drug therapy: Secondary | ICD-10-CM

## 2023-12-16 DIAGNOSIS — R634 Abnormal weight loss: Secondary | ICD-10-CM

## 2023-12-16 DIAGNOSIS — Z13220 Encounter for screening for lipoid disorders: Secondary | ICD-10-CM | POA: Diagnosis not present

## 2023-12-16 DIAGNOSIS — F439 Reaction to severe stress, unspecified: Secondary | ICD-10-CM

## 2023-12-16 DIAGNOSIS — M533 Sacrococcygeal disorders, not elsewhere classified: Secondary | ICD-10-CM | POA: Diagnosis not present

## 2023-12-16 DIAGNOSIS — L4050 Arthropathic psoriasis, unspecified: Secondary | ICD-10-CM | POA: Diagnosis not present

## 2023-12-16 DIAGNOSIS — L409 Psoriasis, unspecified: Secondary | ICD-10-CM | POA: Diagnosis not present

## 2023-12-16 DIAGNOSIS — M19071 Primary osteoarthritis, right ankle and foot: Secondary | ICD-10-CM

## 2023-12-16 DIAGNOSIS — Z8639 Personal history of other endocrine, nutritional and metabolic disease: Secondary | ICD-10-CM

## 2023-12-16 DIAGNOSIS — M5030 Other cervical disc degeneration, unspecified cervical region: Secondary | ICD-10-CM

## 2023-12-16 DIAGNOSIS — E785 Hyperlipidemia, unspecified: Secondary | ICD-10-CM | POA: Diagnosis not present

## 2023-12-16 DIAGNOSIS — M47816 Spondylosis without myelopathy or radiculopathy, lumbar region: Secondary | ICD-10-CM

## 2023-12-16 NOTE — Patient Instructions (Signed)
Standing Labs We placed an order today for your standing lab work.   Please have your standing labs drawn in April and every 3 months  Please have your labs drawn 2 weeks prior to your appointment so that the provider can discuss your lab results at your appointment, if possible.  Please note that you may see your imaging and lab results in [facility] before we have reviewed them. We will contact you once all results are reviewed. Please allow our office up to 72 hours to thoroughly review all of the results before contacting the office for clarification of your results.  WALK-IN LAB HOURS  Monday through Thursday from 8:00 am -12:30 pm and 1:00 pm-5:00 pm and Friday from 8:00 am-12:00 pm.  Patients with office visits requiring labs will be seen before walk-in labs.  You may encounter longer than normal wait times. Please allow additional time. Wait times may be shorter on  Monday and Thursday afternoons.  We do not book appointments for walk-in labs. We appreciate your patience and understanding with our staff.   Labs are drawn by [facility]. Please bring your co-pay at the time of your lab draw.  You may receive a bill from [facility] for your lab work.  Please note if you are on Hydroxychloroquine and and an order has been placed for a Hydroxychloroquine level,  you will need to have it drawn 4 hours or more after your last dose.  If you wish to have your labs drawn at another location, please call the office 24 hours in advance so we can fax the orders.  The office is located at [address] [place], [place], [place] [address]   If you have any questions regarding directions or hours of operation,  please call [phone].   As a reminder, please drink plenty of water prior to coming for your lab work. Thanks!   Vaccines You are taking a medication(s) that can suppress your immune system.  The following immunizations are recommended: Flu annually Covid-19  RSV Td/Tdap (tetanus,  diphtheria, pertussis) every 10 years Pneumonia (Prevnar 15 then Pneumovax 23 at least 1 year apart.  Alternatively, can take Prevnar 20 without needing additional dose) Shingrix: 2 doses from 4 weeks to 6 months apart  Please check with your PCP to make sure you are up to date.   If you have signs or symptoms of an infection or start antibiotics: First, call your PCP for workup of your infection. Hold your medication through the infection, until you complete your antibiotics, and until symptoms resolve if you take the following: Injectable medication (Actemra, Benlysta, Cimzia, Cosentyx, Enbrel, Humira, Kevzara, Orencia, Remicade, Simponi, Stelara, Taltz, Tremfya) Methotrexate Leflunomide (Arava) Mycophenolate (Cellcept) Morrie Sheldon, Olumiant, or Rinvoq

## 2023-12-17 LAB — LIPID PANEL
Cholesterol: 185 mg/dL (ref <200)
HDL: 73 mg/dL (ref >=50)
LDL Cholesterol (Calc): 102 mg/dL — ABNORMAL HIGH
Non-HDL Cholesterol (Calc): 112 mg/dL (ref <130)
Total CHOL/HDL Ratio: 2.5 (calc) (ref <5.0)
Triglycerides: 35 mg/dL (ref <150)

## 2023-12-17 LAB — CBC WITH DIFFERENTIAL/PLATELET
Absolute Lymphocytes: 1685 {cells}/uL (ref 850–3900)
Absolute Monocytes: 315 {cells}/uL (ref 200–950)
Basophils Absolute: 50 {cells}/uL (ref 0–200)
Basophils Relative: 1 %
Eosinophils Absolute: 160 {cells}/uL (ref 15–500)
Eosinophils Relative: 3.2 %
HCT: 40.7 % (ref 35.0–45.0)
Hemoglobin: 13.3 g/dL (ref 11.7–15.5)
MCH: 28.7 pg (ref 27.0–33.0)
MCHC: 32.7 g/dL (ref 32.0–36.0)
MCV: 87.7 fL (ref 80.0–100.0)
MPV: 12.2 fL (ref 7.5–12.5)
Monocytes Relative: 6.3 %
Neutro Abs: 2790 {cells}/uL (ref 1500–7800)
Neutrophils Relative %: 55.8 %
Platelets: 207 10*3/uL (ref 140–400)
RBC: 4.64 10*6/uL (ref 3.80–5.10)
RDW: 12.7 % (ref 11.0–15.0)
Total Lymphocyte: 33.7 %
WBC: 5 10*3/uL (ref 3.8–10.8)

## 2023-12-17 LAB — COMPLETE METABOLIC PANEL WITH GFR
AG Ratio: 1.4 (calc) (ref 1.0–2.5)
ALT: 12 U/L (ref 6–29)
AST: 15 U/L (ref 10–35)
Albumin: 4.3 g/dL (ref 3.6–5.1)
Alkaline phosphatase (APISO): 71 U/L (ref 37–153)
BUN: 10 mg/dL (ref 7–25)
CO2: 28 mmol/L (ref 20–32)
Calcium: 9.6 mg/dL (ref 8.6–10.4)
Chloride: 102 mmol/L (ref 98–110)
Creat: 0.74 mg/dL (ref 0.50–1.03)
Globulin: 3.1 g/dL (ref 1.9–3.7)
Glucose, Bld: 86 mg/dL (ref 65–99)
Potassium: 4.6 mmol/L (ref 3.5–5.3)
Sodium: 137 mmol/L (ref 135–146)
Total Bilirubin: 0.7 mg/dL (ref 0.2–1.2)
Total Protein: 7.4 g/dL (ref 6.1–8.1)
eGFR: 94 mL/min/{1.73_m2} (ref >=60)

## 2023-12-17 LAB — IRON,TIBC AND FERRITIN PANEL
%SAT: 21 % (ref 16–45)
Ferritin: 43 ng/mL (ref 16–232)
Iron: 72 ug/dL (ref 45–160)
TIBC: 335 ug/dL (ref 250–450)

## 2023-12-17 LAB — VITAMIN B12: Vitamin B-12: 1549 pg/mL — ABNORMAL HIGH (ref 200–1100)

## 2023-12-17 LAB — VITAMIN D 25 HYDROXY (VIT D DEFICIENCY, FRACTURES): Vit D, 25-Hydroxy: 45 ng/mL (ref 30–100)

## 2023-12-17 LAB — TSH: TSH: 0.88 m[IU]/L (ref 0.40–4.50)

## 2023-12-17 NOTE — Progress Notes (Signed)
CBC and CMP are normal, vitamin B-12 is high but no need to adjust the dose, vitamin D is in the desirable range, iron panel is normal, TSH is normal, LDL is mildly elevated at 161 the desirable range is less than 100.

## 2023-12-22 ENCOUNTER — Other Ambulatory Visit: Payer: Self-pay | Admitting: Physician Assistant

## 2023-12-22 DIAGNOSIS — L4050 Arthropathic psoriasis, unspecified: Secondary | ICD-10-CM

## 2023-12-22 NOTE — Telephone Encounter (Signed)
Last Fill: 11/30/2023 (30 day supply)  Labs: 12/16/2023 CBC and CMP are normal,   TB Gold: 07/13/2023 Neg    Next Visit: 05/02/2024  Last Visit: 12/16/2023  [id number] arthritis   Current Dose per office note 12/16/2023: Cosentyx 300 mg sq injections every 4 weeks   Okay to refill Cosentyx?

## 2023-12-24 ENCOUNTER — Other Ambulatory Visit (HOSPITAL_BASED_OUTPATIENT_CLINIC_OR_DEPARTMENT_OTHER): Payer: Self-pay

## 2023-12-27 ENCOUNTER — Other Ambulatory Visit (HOSPITAL_BASED_OUTPATIENT_CLINIC_OR_DEPARTMENT_OTHER): Payer: Self-pay

## 2024-01-28 DIAGNOSIS — L4050 Arthropathic psoriasis, unspecified: Secondary | ICD-10-CM | POA: Diagnosis not present

## 2024-01-28 DIAGNOSIS — E669 Obesity, unspecified: Secondary | ICD-10-CM | POA: Diagnosis not present

## 2024-01-28 DIAGNOSIS — E559 Vitamin D deficiency, unspecified: Secondary | ICD-10-CM | POA: Diagnosis not present

## 2024-01-28 DIAGNOSIS — E039 Hypothyroidism, unspecified: Secondary | ICD-10-CM | POA: Diagnosis not present

## 2024-02-01 ENCOUNTER — Other Ambulatory Visit (HOSPITAL_BASED_OUTPATIENT_CLINIC_OR_DEPARTMENT_OTHER): Payer: Self-pay

## 2024-02-01 DIAGNOSIS — K760 Fatty (change of) liver, not elsewhere classified: Secondary | ICD-10-CM | POA: Diagnosis not present

## 2024-02-01 DIAGNOSIS — E538 Deficiency of other specified B group vitamins: Secondary | ICD-10-CM | POA: Diagnosis not present

## 2024-02-01 MED ORDER — WEGOVY 1.7 MG/0.75ML ~~LOC~~ SOAJ
1.7000 mg | SUBCUTANEOUS | 2 refills | Status: AC
Start: 2024-02-01 — End: ?
  Filled 2024-02-01: qty 3, 28d supply, fill #0
  Filled 2024-03-01: qty 3, 28d supply, fill #1
  Filled 2024-04-04: qty 3, 28d supply, fill #2

## 2024-02-29 ENCOUNTER — Telehealth: Payer: Self-pay | Admitting: Pharmacist

## 2024-02-29 DIAGNOSIS — Z79899 Other long term (current) drug therapy: Secondary | ICD-10-CM

## 2024-02-29 DIAGNOSIS — L4050 Arthropathic psoriasis, unspecified: Secondary | ICD-10-CM

## 2024-02-29 DIAGNOSIS — L409 Psoriasis, unspecified: Secondary | ICD-10-CM

## 2024-02-29 MED ORDER — COSENTYX SENSOREADY (300 MG) 150 MG/ML ~~LOC~~ SOAJ
SUBCUTANEOUS | 0 refills | Status: DC
  Filled 2024-03-10: qty 2, 28d supply, fill #0

## 2024-02-29 NOTE — Telephone Encounter (Signed)
 Received fax from [facility] that patient must now fill her Cosentyx with [facility]. ATC patient to determine next dose due date. Unable to reach and unable to leave VM. MyChart message sent to patient for details  Chesley Mires, PharmD, MPH, BCPS, CPP Clinical Pharmacist (Rheumatology and Pulmonology)

## 2024-03-01 ENCOUNTER — Other Ambulatory Visit (HOSPITAL_BASED_OUTPATIENT_CLINIC_OR_DEPARTMENT_OTHER): Payer: Self-pay

## 2024-03-03 ENCOUNTER — Other Ambulatory Visit (HOSPITAL_COMMUNITY): Payer: Self-pay

## 2024-03-03 NOTE — Telephone Encounter (Signed)
 Test claim revealed that pt's copay is $1,558.99, we will need to see if she has a copay card on hand.  ATC pt, she did not answer. Will try again later.

## 2024-03-09 ENCOUNTER — Other Ambulatory Visit: Payer: Self-pay

## 2024-03-10 ENCOUNTER — Other Ambulatory Visit: Payer: Self-pay

## 2024-03-10 ENCOUNTER — Other Ambulatory Visit (HOSPITAL_COMMUNITY): Payer: Self-pay

## 2024-03-10 NOTE — Progress Notes (Signed)
 Patient on Cosentyx 300mg  subcut every 4 weeks for PsA/PsO. Transferring to [facility] due to insurance restriction.  Chesley Mires, PharmD, MPH, BCPS, CPP Clinical Pharmacist (Rheumatology and Pulmonology)

## 2024-03-10 NOTE — Progress Notes (Signed)
 Specialty Pharmacy Initial Fill Coordination Note  Wendy [name] is a 58 y.o. female contacted today regarding initial fill of specialty medication(s) Secukinumab (Cosentyx Sensoready (300 MG))   Patient requested Delivery   Delivery date: 03/14/24   Verified address: [address]   [place] [address]   Medication will be filled on 03/13/24.   Patient is aware of $0 copayment. When processing copay card there is a phone number that will need to be called in order to have the payment authorized.

## 2024-03-13 ENCOUNTER — Other Ambulatory Visit: Payer: Self-pay

## 2024-04-04 ENCOUNTER — Other Ambulatory Visit (HOSPITAL_BASED_OUTPATIENT_CLINIC_OR_DEPARTMENT_OTHER): Payer: Self-pay

## 2024-04-07 ENCOUNTER — Other Ambulatory Visit (HOSPITAL_BASED_OUTPATIENT_CLINIC_OR_DEPARTMENT_OTHER): Payer: Self-pay

## 2024-04-07 MED ORDER — WEGOVY 2.4 MG/0.75ML ~~LOC~~ SOAJ
2.4000 mg | SUBCUTANEOUS | 2 refills | Status: DC
  Filled 2024-04-07 – 2024-04-27 (×2): qty 3, 28d supply, fill #0
  Filled 2024-05-26: qty 3, 28d supply, fill #1
  Filled 2024-06-18 – 2024-07-05 (×2): qty 3, 28d supply, fill #2

## 2024-04-18 ENCOUNTER — Other Ambulatory Visit (HOSPITAL_BASED_OUTPATIENT_CLINIC_OR_DEPARTMENT_OTHER): Payer: Self-pay

## 2024-04-18 NOTE — Progress Notes (Signed)
 Office Visit Note  [name]             Date of Birth: 1966-02-21           MRN: [id number]             PCP: Aldo Hun, MD Referring: Aldo Hun, MD Visit Date: 05/02/2024 Occupation: @GUAROCC @  Subjective:  Pain in SI joints  History of Present Illness: MAURISA TESMER is a 58 y.o. female with psoriatic arthritis, psoriasis and osteoarthritis.  She denies having a flare of psoriatic arthritis.  Although she has been experiencing increased SI joint discomfort recently.  She has been very busy with her son's wedding coming up.  She has not noticed any flares of psoriatic arthritis.  She had some psoriasis on her scalp and requested clobetasol  topical solution and shampoo.  She has been using muscle relaxer as needed for lower back pain.  She also goes to Pilates classes and exercising on a regular basis.    Activities of Daily Living:  Patient reports morning stiffness for 1 hour.   Patient Reports nocturnal pain.  Difficulty dressing/grooming: Denies Difficulty climbing stairs: Denies Difficulty getting out of chair: Denies Difficulty using hands for taps, buttons, cutlery, and/or writing: Denies  Review of Systems  Constitutional:  Positive for fatigue.  HENT:  Negative for mouth sores and mouth dryness.   Eyes:  Negative for dryness.  Respiratory:  Negative for shortness of breath.   Cardiovascular:  Negative for chest pain and palpitations.  Gastrointestinal:  Negative for blood in stool, constipation and diarrhea.  Endocrine: Negative for increased urination.  Genitourinary:  Negative for involuntary urination.  Musculoskeletal:  Positive for joint pain, joint pain, muscle weakness and morning stiffness. Negative for gait problem, joint swelling, myalgias, muscle tenderness and myalgias.  Skin:  Positive for hair loss. Negative for color change, rash and sensitivity to sunlight.  Allergic/Immunologic: Positive for susceptible to infections.  Neurological:   Positive for headaches. Negative for dizziness.  Hematological:  Negative for swollen glands.  Psychiatric/Behavioral:  Positive for sleep disturbance. Negative for depressed mood. The patient is nervous/anxious.     PMFS History:  Patient Active Problem List   Diagnosis Date Noted   Nonallopathic lesion of lumbosacral region 11/04/2018   Nonallopathic lesion of sacral region 11/04/2018   Nonallopathic lesion of thoracic region 11/04/2018   Psoriasis 07/05/2017   DJD (degenerative joint disease), cervical 02/03/2017   Spondylosis of lumbar region without myelopathy or radiculopathy 02/03/2017   High risk medication use 10/06/2016   Plantar fasciitis 10/06/2016   Achilles tendinitis of both lower extremities 10/06/2016   Vitamin D  deficiency 10/06/2016   Primary osteoarthritis of both knees 10/06/2016   Primary osteoarthritis of both feet 10/06/2016   Chronic low back pain without sciatica 10/06/2016   Hypothyroidism 10/06/2016   Polyp of colon 10/06/2016   B12 deficiency 10/06/2016   Psoriatic arthropathy (HCC) 04/11/2012   Anal fissure 04/11/2012    Past Medical History:  Diagnosis Date   Anal fissure 2004   Tx'd with diltiazem  gel by Dr. Dellie Fergusson   Arthritis    Bronchitis, acute 2008   Psoriatic arthritis (HCC)    Seasonal allergies    Thyroid  disease    URI 10/12/2007   Qualifier: Diagnosis of  By: Donnice Gale MD, Corinthia Dickinson History  Problem Relation Age of Onset   Hypertension Mother    Diabetes Mother    Liver disease Sister  liver transplant    Cirrhosis Sister        non-alcoholic    Other Sister        fatty liver   Past Surgical History:  Procedure Laterality Date   BREAST SURGERY  12/15/2017   breast reduction    Social History   Social History Narrative   Not on file   Immunization History  Administered Date(s) Administered   Moderna Sars-Covid-2 Vaccination 12/29/2019, 01/24/2020, 12/05/2020   Pneumococcal Polysaccharide-23  10/28/2010   Zoster Recombinant(Shingrix) 11/16/2018, 01/30/2019     Objective: Vital Signs: BP 122/83 (BP Location: Left Arm, Patient Position: Sitting, Cuff Size: Normal)   Pulse 68   Resp 13   Ht 5\' 2"  (1.575 m)   Wt 128 lb 9.6 oz (58.3 kg)   BMI 23.52 kg/m    Physical Exam Vitals and nursing note reviewed.  Constitutional:      Appearance: She is well-developed.  HENT:     Head: Normocephalic and atraumatic.  Eyes:     Conjunctiva/sclera: Conjunctivae normal.  Cardiovascular:     Rate and Rhythm: Normal rate and regular rhythm.     Heart sounds: Normal heart sounds.  Pulmonary:     Effort: Pulmonary effort is normal.     Breath sounds: Normal breath sounds.  Abdominal:     General: Bowel sounds are normal.     Palpations: Abdomen is soft.  Musculoskeletal:     Cervical back: Normal range of motion.  Lymphadenopathy:     Cervical: No cervical adenopathy.  Skin:    General: Skin is warm and dry.     Capillary Refill: Capillary refill takes less than 2 seconds.  Neurological:     Mental Status: She is alert and oriented to person, place, and time.  Psychiatric:        Behavior: Behavior normal.      Musculoskeletal Exam: Cervical, thoracic and lumbar spine were in good range of motion.  She had tenderness over bilateral SI joints.  Shoulders, elbow joints, wrist joints, MCPs PIPs and DIPs with good range of motion with no synovitis.  Hip joints and knee joints with good range of motion without any warmth swelling or effusion.  There was no tenderness over ankles or MTPs.  There was no Achilles tendinitis or plantar fasciitis.  CDAI Exam: CDAI Score: -- Patient Global: --; Provider Global: -- Swollen: --; Tender: -- Joint Exam 05/02/2024   No joint exam has been documented for this visit   There is currently no information documented on the homunculus. Go to the Rheumatology activity and complete the homunculus joint exam.  Investigation: No additional  findings.  Imaging: No results found.  Recent Labs: Lab Results  Component Value Date   WBC 5.0 12/16/2023   HGB 13.3 12/16/2023   PLT 207 12/16/2023   NA 137 12/16/2023   K 4.6 12/16/2023   CL 102 12/16/2023   CO2 28 12/16/2023   GLUCOSE 86 12/16/2023   BUN 10 12/16/2023   CREATININE 0.74 12/16/2023   BILITOT 0.7 12/16/2023   ALKPHOS 77 05/27/2017   AST 15 12/16/2023   ALT 12 12/16/2023   PROT 7.4 12/16/2023   ALBUMIN 3.9 05/27/2017   CALCIUM 9.6 12/16/2023   GFRAA 88 05/16/2021   QFTBGOLDPLUS NEGATIVE 07/13/2023    Speciality Comments: Prior therapy:  failed Humira, Enbrel, Otezla, intolerance to oral methotrexate  and Arava Otrexup  x1 dose and then discontinued  Procedures:  No procedures performed Allergies: Pollen extract, Arava [leflunomide], and Methotrexate   derivatives   Assessment / Plan:     Visit Diagnoses: Psoriatic arthritis (HCC)-patient had no synovitis on examination.  She states she has not had any psoriasis flares but she has been having increased discomfort in the SI joints.  No synovitis was noted on the examination.  Psoriasis -she has been having some breakout of psoriasis on her scalp.  She states she has been under stress.  She requested external solution and shampoo.  I advised her to use only one or the other at a time.  Prescriptions were sent.  Side effects were discussed.  Plan: clobetasol  (TEMOVATE ) 0.05 % external solution  High risk medication use - Cosentyx  300 mg sq injections every 4 weeks -CBC and CMP were normal in January 2025.  CK was normal.  TSH was normal.  TB Gold was negative on July 13, 2023.  Will check CBC and CMP today and repeat TB Gold in September.  Information or immunization was placed in the AVS.  She was advised to hold Cosyntex if she develops an infection resume after the infection resolves.  Plan: CBC with Differential/Platelet, Comprehensive metabolic panel with GFR, QuantiFERON-TB Gold Plus  Sacroiliac pain -she  has been having increased SI joint discomfort recently.  She has been very busy with her son's wedding coming up.  SI joint stretches were demonstrated.  If her symptoms get worse we can consider cortisone injection in the future.  Prescription refill for tizanidine  was also sent which she has been using on as needed basis.  Side effects of muscle relaxers were discussed.  X-rays from 2021 showed mild SI joint to sclerosis and osteoarthritic changes. - Plan: tiZANidine  (ZANAFLEX ) 4 MG tablet  Primary osteoarthritis of both knees-she is currently not having any discomfort in her knee joints.  No warmth swelling or effusion was noted.  Primary osteoarthritis of both feet-she denies any discomfort in her feet.  There was no tenderness on the examination.  There was no Achilles tendinitis or plantar fasciitis.  DDD (degenerative disc disease), cervical-she had good motion of the cervical spine without discomfort.  Lumbar spondylosis -she continues to have some lower back discomfort and muscle spasms.  She takes tizanidine  4 mg p.o. nightly on  as needed basis.    Vitamin D  deficiency-vitamin D  was normal at 45 on December 16, 2023.  The medical problems listed is follows.  History of hypothyroidism-TSH was normal in January.  Stress  Dyslipidemia-December 16, 2023 LDL 102, HDL 73, triglycerides 35  Other fatigue  Weight loss-related to medication use.  Orders: Orders Placed This Encounter  Procedures   CBC with Differential/Platelet   Comprehensive metabolic panel with GFR   QuantiFERON-TB Gold Plus   Meds ordered this encounter  Medications   tiZANidine  (ZANAFLEX ) 4 MG tablet    Sig: Take 1 tablet (4 mg total) by mouth at bedtime.    Dispense:  30 tablet    Refill:  2   clobetasol  (TEMOVATE ) 0.05 % external solution    Sig: Apply 1 Application topically 2 (two) times daily.    Dispense:  50 mL    Refill:  0   Clobetasol  Propionate 0.05 % shampoo    Sig: Apply 1 Application  topically 2 (two) times a week.    Dispense:  118 mL    Refill:  0     Follow-Up Instructions: Return in about 5 months (around 10/02/2024) for Psoriatic arthritis.   Nicholas Bari, MD  Note - This record has been created using Dragon  software.  Chart creation errors have been sought, but may not always  have been located. Such creation errors do not reflect on  the standard of medical care.

## 2024-04-19 ENCOUNTER — Other Ambulatory Visit (HOSPITAL_COMMUNITY): Payer: Self-pay

## 2024-04-21 ENCOUNTER — Other Ambulatory Visit: Payer: Self-pay

## 2024-04-21 ENCOUNTER — Other Ambulatory Visit: Payer: Self-pay | Admitting: Rheumatology

## 2024-04-21 ENCOUNTER — Other Ambulatory Visit (HOSPITAL_COMMUNITY): Payer: Self-pay

## 2024-04-21 DIAGNOSIS — L409 Psoriasis, unspecified: Secondary | ICD-10-CM

## 2024-04-21 DIAGNOSIS — L4050 Arthropathic psoriasis, unspecified: Secondary | ICD-10-CM

## 2024-04-21 DIAGNOSIS — Z79899 Other long term (current) drug therapy: Secondary | ICD-10-CM

## 2024-04-21 MED ORDER — COSENTYX SENSOREADY (300 MG) 150 MG/ML ~~LOC~~ SOAJ
SUBCUTANEOUS | 0 refills | Status: DC
  Filled 2024-04-21: qty 2, 28d supply, fill #0

## 2024-04-21 NOTE — Progress Notes (Signed)
 Specialty Pharmacy Refill Coordination Note  Wendy [name] is a 58 y.o. female contacted today regarding refills of specialty medication(s) Secukinumab  (Cosentyx  Sensoready (300 MG))   Patient requested Delivery   Delivery date: 04/27/24   Verified address: [address]   [place] [address]   Medication will be filled on 04/26/24.   This fill date is pending response to refill request from provider. Patient is aware and if they have not received fill by intended date, they must follow up with pharmacy.

## 2024-04-21 NOTE — Telephone Encounter (Signed)
 Last Fill: 02/29/2024 (30 day supply)  Labs: 12/16/2023 CBC and CMP are normal,   TB Gold:  07/13/2023 Neg     Next Visit: 05/02/2024  Last Visit: 12/16/2023  BJ:YNWGNFAOZ arthritis   Current Dose per office note 12/16/2023:  Cosentyx  300 mg sq injections every 4 weeks   Patient to update labs at upcoming appointment on 05/02/2024.   Okay to refill Cosentyx ?

## 2024-04-25 ENCOUNTER — Other Ambulatory Visit: Payer: Self-pay

## 2024-04-26 ENCOUNTER — Other Ambulatory Visit (HOSPITAL_COMMUNITY): Payer: Self-pay

## 2024-04-27 ENCOUNTER — Other Ambulatory Visit (HOSPITAL_BASED_OUTPATIENT_CLINIC_OR_DEPARTMENT_OTHER): Payer: Self-pay

## 2024-05-02 ENCOUNTER — Encounter: Payer: Self-pay | Admitting: Rheumatology

## 2024-05-02 ENCOUNTER — Ambulatory Visit: Payer: BC Managed Care – PPO | Attending: Rheumatology | Admitting: Rheumatology

## 2024-05-02 VITALS — BP 122/83 | HR 68 | Resp 13 | Ht 62.0 in | Wt 128.6 lb

## 2024-05-02 DIAGNOSIS — L4050 Arthropathic psoriasis, unspecified: Secondary | ICD-10-CM | POA: Diagnosis not present

## 2024-05-02 DIAGNOSIS — E559 Vitamin D deficiency, unspecified: Secondary | ICD-10-CM

## 2024-05-02 DIAGNOSIS — Z8639 Personal history of other endocrine, nutritional and metabolic disease: Secondary | ICD-10-CM

## 2024-05-02 DIAGNOSIS — Z79899 Other long term (current) drug therapy: Secondary | ICD-10-CM

## 2024-05-02 DIAGNOSIS — M170 Bilateral primary osteoarthritis of knee: Secondary | ICD-10-CM

## 2024-05-02 DIAGNOSIS — M533 Sacrococcygeal disorders, not elsewhere classified: Secondary | ICD-10-CM | POA: Diagnosis not present

## 2024-05-02 DIAGNOSIS — M19072 Primary osteoarthritis, left ankle and foot: Secondary | ICD-10-CM

## 2024-05-02 DIAGNOSIS — L409 Psoriasis, unspecified: Secondary | ICD-10-CM

## 2024-05-02 DIAGNOSIS — M5030 Other cervical disc degeneration, unspecified cervical region: Secondary | ICD-10-CM

## 2024-05-02 DIAGNOSIS — R5383 Other fatigue: Secondary | ICD-10-CM

## 2024-05-02 DIAGNOSIS — F439 Reaction to severe stress, unspecified: Secondary | ICD-10-CM

## 2024-05-02 DIAGNOSIS — M47816 Spondylosis without myelopathy or radiculopathy, lumbar region: Secondary | ICD-10-CM

## 2024-05-02 DIAGNOSIS — E785 Hyperlipidemia, unspecified: Secondary | ICD-10-CM

## 2024-05-02 DIAGNOSIS — R634 Abnormal weight loss: Secondary | ICD-10-CM

## 2024-05-02 DIAGNOSIS — M19071 Primary osteoarthritis, right ankle and foot: Secondary | ICD-10-CM

## 2024-05-02 LAB — CBC WITH DIFFERENTIAL/PLATELET
Absolute Lymphocytes: 1832 {cells}/uL (ref 850–3900)
Absolute Monocytes: 306 {cells}/uL (ref 200–950)
Basophils Absolute: 59 {cells}/uL (ref 0–200)
Basophils Relative: 1.3 %
Eosinophils Absolute: 315 {cells}/uL (ref 15–500)
Eosinophils Relative: 7 %
HCT: 40.2 % (ref 35.0–45.0)
Hemoglobin: 12.6 g/dL (ref 11.7–15.5)
MCH: 28.3 pg (ref 27.0–33.0)
MCHC: 31.3 g/dL — ABNORMAL LOW (ref 32.0–36.0)
MCV: 90.3 fL (ref 80.0–100.0)
MPV: 12 fL (ref 7.5–12.5)
Monocytes Relative: 6.8 %
Neutro Abs: 1989 {cells}/uL (ref 1500–7800)
Neutrophils Relative %: 44.2 %
Platelets: 191 10*3/uL (ref 140–400)
RBC: 4.45 10*6/uL (ref 3.80–5.10)
RDW: 12.7 % (ref 11.0–15.0)
Total Lymphocyte: 40.7 %
WBC: 4.5 10*3/uL (ref 3.8–10.8)

## 2024-05-02 LAB — COMPREHENSIVE METABOLIC PANEL WITH GFR
AG Ratio: 1.4 (calc) (ref 1.0–2.5)
ALT: 12 U/L (ref 6–29)
AST: 18 U/L (ref 10–35)
Albumin: 4.2 g/dL (ref 3.6–5.1)
Alkaline phosphatase (APISO): 69 U/L (ref 37–153)
BUN: 12 mg/dL (ref 7–25)
CO2: 30 mmol/L (ref 20–32)
Calcium: 9.4 mg/dL (ref 8.6–10.4)
Chloride: 102 mmol/L (ref 98–110)
Creat: 0.74 mg/dL (ref 0.50–1.03)
Globulin: 2.9 g/dL (ref 1.9–3.7)
Glucose, Bld: 76 mg/dL (ref 65–99)
Potassium: 4.2 mmol/L (ref 3.5–5.3)
Sodium: 137 mmol/L (ref 135–146)
Total Bilirubin: 0.4 mg/dL (ref 0.2–1.2)
Total Protein: 7.1 g/dL (ref 6.1–8.1)
eGFR: 94 mL/min/{1.73_m2} (ref >=60)

## 2024-05-02 MED ORDER — CLOBETASOL PROPIONATE 0.05 % EX SOLN: 1.0000 | Freq: Two times a day (BID) | CUTANEOUS | 0 refills | Status: AC

## 2024-05-02 MED ORDER — TIZANIDINE HCL 4 MG PO TABS: 4.0000 mg | ORAL_TABLET | Freq: Every day | ORAL | 2 refills | Status: AC

## 2024-05-02 MED ORDER — CLOBETASOL PROPIONATE 0.05 % EX SHAM: 1.0000 | MEDICATED_SHAMPOO | CUTANEOUS | 0 refills | Status: DC

## 2024-05-02 NOTE — Patient Instructions (Signed)
 Standing Labs We placed an order today for your standing lab work.   Please have your standing labs drawn in September and every 3 months  Please have your labs drawn 2 weeks prior to your appointment so that the provider can discuss your lab results at your appointment, if possible.  Please note that you may see your imaging and lab results in MyChart before we have reviewed them. We will contact you once all results are reviewed. Please allow our office up to 72 hours to thoroughly review all of the results before contacting the office for clarification of your results.  WALK-IN LAB HOURS  Monday through Thursday from 8:00 am -12:30 pm and 1:00 pm-4:00 pm and Friday from 8:00 am-12:00 pm.  Patients with office visits requiring labs will be seen before walk-in labs.  You may encounter longer than normal wait times. Please allow additional time. Wait times may be shorter on  Monday and Thursday afternoons.  We do not book appointments for walk-in labs. We appreciate your patience and understanding with our staff.   Labs are drawn by [facility]. Please bring your co-pay at the time of your lab draw.  You may receive a bill from [facility] for your lab work.  Please note if you are on Hydroxychloroquine and and an order has been placed for a Hydroxychloroquine level,  you will need to have it drawn 4 hours or more after your last dose.  If you wish to have your labs drawn at another location, please call the office 24 hours in advance so we can fax the orders.  The office is located at [address], [address], [place], [place] [address]   If you have any questions regarding directions or hours of operation,  please call [phone].   As a reminder, please drink plenty of water  prior to coming for your lab work. Thanks!   Vaccines You are taking a medication(s) that can suppress your immune system.  The following immunizations are recommended: Flu annually Covid-19  Td/Tdap (tetanus,  diphtheria, pertussis) every 10 years Pneumonia (Prevnar 15 then Pneumovax 23 at least 1 year apart.  Alternatively, can take Prevnar 20 without needing additional dose) Shingrix: 2 doses from 4 weeks to 6 months apart  Please check with your PCP to make sure you are up to date.   If you have signs or symptoms of an infection or start antibiotics: First, call your PCP for workup of your infection. Hold your medication through the infection, until you complete your antibiotics, and until symptoms resolve if you take the following: Injectable medication (Actemra, Benlysta, Cimzia, Cosentyx , Enbrel, Humira, Kevzara, Orencia, Remicade, Simponi, Stelara, Taltz, Tremfya) Methotrexate  Leflunomide (Arava) Mycophenolate (Cellcept) Cloria Danger, Olumiant, or Rinvoq

## 2024-05-03 ENCOUNTER — Telehealth: Payer: Self-pay | Admitting: *Deleted

## 2024-05-03 ENCOUNTER — Ambulatory Visit: Payer: Self-pay | Admitting: Rheumatology

## 2024-05-03 NOTE — Telephone Encounter (Signed)
 Submitted a Prior Authorization request to [employer] [place] for Clobetasol  Shampoo via CoverMyMeds. Will update once we receive a response.

## 2024-05-03 NOTE — Progress Notes (Signed)
 CBC and CMP normal

## 2024-05-04 NOTE — Telephone Encounter (Signed)
 Wendy [name] from [employer] called the office to advise that the Clobetasol  shampoo was denied for the PA. He provided me with Case# [id number]        Received a fax regarding Prior Authorization from [facility] for Clobetasol  Propionate 0.05% Shampoo. Authorization has been DENIED because the request does not meet the definition of medical necessity found in the members benefit booklet.    Phone# [phone]

## 2024-05-08 ENCOUNTER — Telehealth: Payer: Self-pay | Admitting: Rheumatology

## 2024-05-08 DIAGNOSIS — L409 Psoriasis, unspecified: Secondary | ICD-10-CM

## 2024-05-08 NOTE — Telephone Encounter (Signed)
 I received a phone call from the patient.  She is requesting a prior Auth for clobetasol  shampoo. Thank you, Nicholas Bari, MD

## 2024-05-09 MED ORDER — CLOBETASOL PROPIONATE 0.05 % EX SHAM: 1.0000 | MEDICATED_SHAMPOO | CUTANEOUS | 0 refills | Status: AC

## 2024-05-09 NOTE — Telephone Encounter (Signed)
 PA was performed for the shampoo and was denied.

## 2024-05-09 NOTE — Telephone Encounter (Signed)
 Found coupon on goodrx.com for patient. [facility]. [facility] would [facility] so she may pick it up using the coupon. Text coupon to patient via good rx website.

## 2024-05-09 NOTE — Addendum Note (Signed)
 Addended by: Adrianne Horn on: 05/09/2024 02:29 PM   Modules accepted: Orders

## 2024-05-22 ENCOUNTER — Other Ambulatory Visit: Payer: Self-pay

## 2024-05-22 ENCOUNTER — Other Ambulatory Visit: Payer: Self-pay | Admitting: Rheumatology

## 2024-05-22 ENCOUNTER — Other Ambulatory Visit (HOSPITAL_COMMUNITY): Payer: Self-pay

## 2024-05-22 DIAGNOSIS — L4050 Arthropathic psoriasis, unspecified: Secondary | ICD-10-CM

## 2024-05-22 DIAGNOSIS — L409 Psoriasis, unspecified: Secondary | ICD-10-CM

## 2024-05-22 DIAGNOSIS — Z79899 Other long term (current) drug therapy: Secondary | ICD-10-CM

## 2024-05-22 MED ORDER — COSENTYX SENSOREADY (300 MG) 150 MG/ML ~~LOC~~ SOAJ
SUBCUTANEOUS | 2 refills | Status: DC
  Filled 2024-05-22: qty 2, 28d supply, fill #0
  Filled 2024-06-19: qty 2, 28d supply, fill #1
  Filled 2024-07-12: qty 2, 28d supply, fill #2

## 2024-05-22 NOTE — Progress Notes (Signed)
 Specialty Pharmacy Refill Coordination Note  Wendy [name] is a 58 y.o. female contacted today regarding refills of specialty medication(s) Secukinumab  (Cosentyx  Sensoready (300 MG))   Patient requested Delivery   Delivery date: 05/25/24   Verified address: [address]   [place] [address]   Medication will be filled on 05/24/24.   This fill date is pending response to refill request from provider. Patient is aware and if they have not received fill by intended date they must follow up with pharmacy.

## 2024-05-22 NOTE — Telephone Encounter (Signed)
 Last Fill: 04/21/2024  Labs: 05/02/2024 CBC and CMP normal.   TB Gold: 07/13/2023 Neg    Next Visit: 10/10/2024  Last Visit: 05/02/2024  IK:Ednmpjupr arthritis   Current Dose per office note 05/02/2024: Cosentyx  300 mg sq injections every 4 weeks   Okay to refill Cosentyx ?

## 2024-05-23 ENCOUNTER — Other Ambulatory Visit: Payer: Self-pay

## 2024-05-25 DIAGNOSIS — N3946 Mixed incontinence: Secondary | ICD-10-CM | POA: Diagnosis not present

## 2024-05-26 ENCOUNTER — Other Ambulatory Visit (HOSPITAL_BASED_OUTPATIENT_CLINIC_OR_DEPARTMENT_OTHER): Payer: Self-pay

## 2024-06-16 ENCOUNTER — Other Ambulatory Visit: Payer: Self-pay

## 2024-06-19 ENCOUNTER — Other Ambulatory Visit: Payer: Self-pay

## 2024-06-19 ENCOUNTER — Other Ambulatory Visit (HOSPITAL_COMMUNITY): Payer: Self-pay

## 2024-06-19 ENCOUNTER — Other Ambulatory Visit (HOSPITAL_BASED_OUTPATIENT_CLINIC_OR_DEPARTMENT_OTHER): Payer: Self-pay

## 2024-06-19 NOTE — Progress Notes (Signed)
 Specialty Pharmacy Refill Coordination Note  Spoke with SABRIAH HOBBINS  MILANA SALAY is a 58 y.o. female contacted today regarding refills of specialty medication(s) Secukinumab  (Cosentyx  Sensoready (300 MG))  Doses on hand: 0  Injection date: 06/26/24   Patient requested: Delivery   Delivery date: 06/22/24   Verified address: [address]  [place] [address]  Medication will be filled on 06/21/24.

## 2024-06-20 ENCOUNTER — Other Ambulatory Visit: Payer: Self-pay

## 2024-06-29 ENCOUNTER — Other Ambulatory Visit (HOSPITAL_BASED_OUTPATIENT_CLINIC_OR_DEPARTMENT_OTHER): Payer: Self-pay

## 2024-07-05 ENCOUNTER — Other Ambulatory Visit (HOSPITAL_BASED_OUTPATIENT_CLINIC_OR_DEPARTMENT_OTHER): Payer: Self-pay

## 2024-07-09 ENCOUNTER — Other Ambulatory Visit: Payer: Self-pay | Admitting: Medical Genetics

## 2024-07-11 ENCOUNTER — Other Ambulatory Visit

## 2024-07-11 DIAGNOSIS — Z006 Encounter for examination for normal comparison and control in clinical research program: Secondary | ICD-10-CM

## 2024-07-12 ENCOUNTER — Other Ambulatory Visit: Payer: Self-pay

## 2024-07-12 NOTE — Progress Notes (Signed)
 Specialty Pharmacy Refill Coordination Note  Wendy [name] is a 58 y.o. female contacted today regarding refills of specialty medication(s) Secukinumab  (Cosentyx  Sensoready (300 MG))   Patient requested Delivery   Delivery date: 07/13/24   Verified address: [address]  [place] [address]   Medication will be filled on 07/12/24.

## 2024-07-21 LAB — GENECONNECT MOLECULAR SCREEN: Genetic Analysis Overall Interpretation: NEGATIVE

## 2024-08-02 ENCOUNTER — Other Ambulatory Visit: Payer: Self-pay | Admitting: Physician Assistant

## 2024-08-02 ENCOUNTER — Other Ambulatory Visit: Payer: Self-pay

## 2024-08-02 DIAGNOSIS — Z79899 Other long term (current) drug therapy: Secondary | ICD-10-CM

## 2024-08-02 DIAGNOSIS — L409 Psoriasis, unspecified: Secondary | ICD-10-CM

## 2024-08-02 DIAGNOSIS — L4050 Arthropathic psoriasis, unspecified: Secondary | ICD-10-CM

## 2024-08-02 MED ORDER — COSENTYX SENSOREADY (300 MG) 150 MG/ML ~~LOC~~ SOAJ
SUBCUTANEOUS | 0 refills | Status: DC
  Filled 2024-08-02: qty 2, fill #0
  Filled 2024-08-11: qty 2, 28d supply, fill #0

## 2024-08-02 NOTE — Telephone Encounter (Signed)
 Last Fill: 05/22/2024   Labs: 05/02/2024 CBC and CMP normal.    TB Gold: 07/13/2023 Neg     Next Visit: 10/10/2024   Last Visit: 05/02/2024   IK:Ednmpjupr arthritis    Current Dose per office note 05/02/2024: Cosentyx  300 mg sq injections every 4 weeks   Attempted to contact the patient and left message to advise she is due to update labs.    Okay to refill Cosentyx ?

## 2024-08-04 ENCOUNTER — Other Ambulatory Visit: Payer: Self-pay

## 2024-08-11 ENCOUNTER — Other Ambulatory Visit: Payer: Self-pay | Admitting: Pharmacy Technician

## 2024-08-11 ENCOUNTER — Other Ambulatory Visit: Payer: Self-pay

## 2024-08-11 NOTE — Progress Notes (Signed)
 Specialty Pharmacy Refill Coordination Note  Wendy [name] is a 58 y.o. female contacted today regarding refills of specialty medication(s) Secukinumab  (Cosentyx  Sensoready (300 MG))   Patient requested Delivery   Delivery date: 08/15/24   Verified address: [address]  [place]   Medication will be filled on 08/14/24.

## 2024-08-12 ENCOUNTER — Other Ambulatory Visit (HOSPITAL_BASED_OUTPATIENT_CLINIC_OR_DEPARTMENT_OTHER): Payer: Self-pay

## 2024-08-15 ENCOUNTER — Other Ambulatory Visit (HOSPITAL_BASED_OUTPATIENT_CLINIC_OR_DEPARTMENT_OTHER): Payer: Self-pay

## 2024-08-15 MED ORDER — WEGOVY 2.4 MG/0.75ML ~~LOC~~ SOAJ
2.4000 mg | SUBCUTANEOUS | 2 refills | Status: DC
  Filled 2024-08-15: qty 3, 28d supply, fill #0
  Filled 2024-09-13: qty 3, 28d supply, fill #1
  Filled 2024-09-28 – 2024-10-16 (×3): qty 3, 28d supply, fill #2

## 2024-08-30 ENCOUNTER — Other Ambulatory Visit: Payer: Self-pay

## 2024-09-04 ENCOUNTER — Other Ambulatory Visit (HOSPITAL_COMMUNITY): Payer: Self-pay

## 2024-09-04 ENCOUNTER — Other Ambulatory Visit: Payer: Self-pay

## 2024-09-04 ENCOUNTER — Other Ambulatory Visit: Payer: Self-pay | Admitting: Physician Assistant

## 2024-09-04 ENCOUNTER — Encounter (INDEPENDENT_AMBULATORY_CARE_PROVIDER_SITE_OTHER): Payer: Self-pay

## 2024-09-04 DIAGNOSIS — L409 Psoriasis, unspecified: Secondary | ICD-10-CM

## 2024-09-04 DIAGNOSIS — L4050 Arthropathic psoriasis, unspecified: Secondary | ICD-10-CM

## 2024-09-04 DIAGNOSIS — Z79899 Other long term (current) drug therapy: Secondary | ICD-10-CM

## 2024-09-04 MED ORDER — COSENTYX SENSOREADY (300 MG) 150 MG/ML ~~LOC~~ SOAJ
SUBCUTANEOUS | 0 refills | Status: DC
  Filled 2024-09-04: qty 2, 28d supply, fill #0

## 2024-09-04 NOTE — Telephone Encounter (Signed)
 Last Fill: 08/02/2024  Labs: 05/02/2024 CBC and CMP normal.  TB Gold: 07/13/2023 TB Gold negative.   Next Visit: 10/10/2024  Last Visit: 05/02/2024  IK:Ednmpjupr arthritis (HCC)   Current Dose per office note 05/02/2024: Cosentyx  300 mg sq injections every 4 weeks   Attempted to contact the patient and left a message advising labs are due. Orders already in order review for CBC, CMP, and TB Gold.   Okay to refill Cosentyx ?

## 2024-09-04 NOTE — Progress Notes (Signed)
 Specialty Pharmacy Refill Coordination Note  MyChart Questionnaire Submission  Wendy [name] is a 58 y.o. female contacted today regarding refills of specialty medication(s) Cosentyx .  Doses on hand: (Patient-Rptd) 0   Injection date: (Patient-Rptd) 09/08/24  Patient requested: (Patient-Rptd) Delivery   Delivery date: 09/06/24  Verified address: [address] [place] [address]  Medication will be filled on 09/05/24.    This fill date is pending response to refill request from provider. Patient is aware and if they have not received fill by intended date, they must follow up with pharmacy.

## 2024-09-05 ENCOUNTER — Other Ambulatory Visit: Payer: Self-pay | Admitting: *Deleted

## 2024-09-05 DIAGNOSIS — E039 Hypothyroidism, unspecified: Secondary | ICD-10-CM | POA: Diagnosis not present

## 2024-09-05 DIAGNOSIS — Z79899 Other long term (current) drug therapy: Secondary | ICD-10-CM

## 2024-09-05 DIAGNOSIS — E559 Vitamin D deficiency, unspecified: Secondary | ICD-10-CM | POA: Diagnosis not present

## 2024-09-06 ENCOUNTER — Ambulatory Visit: Payer: Self-pay | Admitting: Physician Assistant

## 2024-09-06 ENCOUNTER — Other Ambulatory Visit: Payer: Self-pay

## 2024-09-06 ENCOUNTER — Other Ambulatory Visit (HOSPITAL_COMMUNITY): Payer: Self-pay

## 2024-09-06 NOTE — Progress Notes (Signed)
 CBC and CMP WNL

## 2024-09-07 LAB — CBC WITH DIFFERENTIAL/PLATELET
Absolute Lymphocytes: 1720 {cells}/uL (ref 850–3900)
Absolute Monocytes: 368 {cells}/uL (ref 200–950)
Basophils Absolute: 49 {cells}/uL (ref 0–200)
Basophils Relative: 1 %
Eosinophils Absolute: 260 {cells}/uL (ref 15–500)
Eosinophils Relative: 5.3 %
HCT: 40.7 % (ref 35.0–45.0)
Hemoglobin: 13.3 g/dL (ref 11.7–15.5)
MCH: 29.3 pg (ref 27.0–33.0)
MCHC: 32.7 g/dL (ref 32.0–36.0)
MCV: 89.6 fL (ref 80.0–100.0)
MPV: 12 fL (ref 7.5–12.5)
Monocytes Relative: 7.5 %
Neutro Abs: 2504 {cells}/uL (ref 1500–7800)
Neutrophils Relative %: 51.1 %
Platelets: 181 Thousand/uL (ref 140–400)
RBC: 4.54 Million/uL (ref 3.80–5.10)
RDW: 12.8 % (ref 11.0–15.0)
Total Lymphocyte: 35.1 %
WBC: 4.9 Thousand/uL (ref 3.8–10.8)

## 2024-09-07 LAB — COMPREHENSIVE METABOLIC PANEL WITH GFR
AG Ratio: 1.5 (calc) (ref 1.0–2.5)
ALT: 15 U/L (ref 6–29)
AST: 17 U/L (ref 10–35)
Albumin: 4.3 g/dL (ref 3.6–5.1)
Alkaline phosphatase (APISO): 67 U/L (ref 37–153)
BUN: 13 mg/dL (ref 7–25)
CO2: 29 mmol/L (ref 20–32)
Calcium: 9.4 mg/dL (ref 8.6–10.4)
Chloride: 103 mmol/L (ref 98–110)
Creat: 0.79 mg/dL (ref 0.50–1.03)
Globulin: 2.8 g/dL (ref 1.9–3.7)
Glucose, Bld: 85 mg/dL (ref 65–99)
Potassium: 4.8 mmol/L (ref 3.5–5.3)
Sodium: 139 mmol/L (ref 135–146)
Total Bilirubin: 0.4 mg/dL (ref 0.2–1.2)
Total Protein: 7.1 g/dL (ref 6.1–8.1)
eGFR: 87 mL/min/1.73m2 (ref >=60)

## 2024-09-07 LAB — QUANTIFERON-TB GOLD PLUS
Mitogen-NIL: >10 [IU]/mL
NIL: 0.02 [IU]/mL
QuantiFERON-TB Gold Plus: NEGATIVE
TB1-NIL: 0 [IU]/mL
TB2-NIL: 0 [IU]/mL

## 2024-09-11 DIAGNOSIS — E039 Hypothyroidism, unspecified: Secondary | ICD-10-CM | POA: Diagnosis not present

## 2024-09-11 DIAGNOSIS — E559 Vitamin D deficiency, unspecified: Secondary | ICD-10-CM | POA: Diagnosis not present

## 2024-09-11 DIAGNOSIS — E669 Obesity, unspecified: Secondary | ICD-10-CM | POA: Diagnosis not present

## 2024-09-13 ENCOUNTER — Other Ambulatory Visit (HOSPITAL_BASED_OUTPATIENT_CLINIC_OR_DEPARTMENT_OTHER): Payer: Self-pay

## 2024-09-26 DIAGNOSIS — M8589 Other specified disorders of bone density and structure, multiple sites: Secondary | ICD-10-CM | POA: Diagnosis not present

## 2024-09-26 DIAGNOSIS — E538 Deficiency of other specified B group vitamins: Secondary | ICD-10-CM | POA: Diagnosis not present

## 2024-09-26 DIAGNOSIS — E039 Hypothyroidism, unspecified: Secondary | ICD-10-CM | POA: Diagnosis not present

## 2024-09-26 DIAGNOSIS — Z1212 Encounter for screening for malignant neoplasm of rectum: Secondary | ICD-10-CM | POA: Diagnosis not present

## 2024-09-26 DIAGNOSIS — D509 Iron deficiency anemia, unspecified: Secondary | ICD-10-CM | POA: Diagnosis not present

## 2024-09-26 DIAGNOSIS — E559 Vitamin D deficiency, unspecified: Secondary | ICD-10-CM | POA: Diagnosis not present

## 2024-09-26 LAB — LAB REPORT - SCANNED
EGFR (Non-African Amer.): 85.9
Free T4: 1.47 ng/dL
TSH: 1.37

## 2024-09-27 ENCOUNTER — Other Ambulatory Visit: Payer: Self-pay | Admitting: Physician Assistant

## 2024-09-27 ENCOUNTER — Other Ambulatory Visit: Payer: Self-pay

## 2024-09-27 DIAGNOSIS — L409 Psoriasis, unspecified: Secondary | ICD-10-CM

## 2024-09-27 DIAGNOSIS — Z79899 Other long term (current) drug therapy: Secondary | ICD-10-CM

## 2024-09-27 DIAGNOSIS — L4050 Arthropathic psoriasis, unspecified: Secondary | ICD-10-CM

## 2024-09-27 MED ORDER — COSENTYX SENSOREADY (300 MG) 150 MG/ML ~~LOC~~ SOAJ
SUBCUTANEOUS | 2 refills | Status: AC
  Filled 2024-09-27: qty 2, fill #0
  Filled 2024-09-28 – 2024-10-04 (×2): qty 2, 28d supply, fill #0
  Filled 2024-10-30 – 2024-11-02 (×2): qty 2, 28d supply, fill #1
  Filled 2024-11-28: qty 2, 28d supply, fill #2

## 2024-09-27 NOTE — Telephone Encounter (Signed)
 Last Fill: 09/04/2024 (30 day supply)  Labs: 09/05/2024 CBC and CMP WNL   TB Gold: 09/05/2024 Neg   Next Visit: 01/11/2024  Last Visit: 05/02/2024  IK:Ednmpjupr arthritis   Current Dose per office note 05/02/2024: Cosentyx  300 mg sq injections every 4 weeks   Okay to refill Cosentyx ?

## 2024-09-28 ENCOUNTER — Other Ambulatory Visit: Payer: Self-pay

## 2024-10-03 DIAGNOSIS — Z23 Encounter for immunization: Secondary | ICD-10-CM | POA: Diagnosis not present

## 2024-10-03 DIAGNOSIS — E538 Deficiency of other specified B group vitamins: Secondary | ICD-10-CM | POA: Diagnosis not present

## 2024-10-03 DIAGNOSIS — E039 Hypothyroidism, unspecified: Secondary | ICD-10-CM | POA: Diagnosis not present

## 2024-10-03 DIAGNOSIS — R82998 Other abnormal findings in urine: Secondary | ICD-10-CM | POA: Diagnosis not present

## 2024-10-03 DIAGNOSIS — Z0000 Encounter for general adult medical examination without abnormal findings: Secondary | ICD-10-CM | POA: Diagnosis not present

## 2024-10-03 DIAGNOSIS — Z1331 Encounter for screening for depression: Secondary | ICD-10-CM | POA: Diagnosis not present

## 2024-10-04 ENCOUNTER — Telehealth: Payer: Self-pay

## 2024-10-04 ENCOUNTER — Other Ambulatory Visit (HOSPITAL_BASED_OUTPATIENT_CLINIC_OR_DEPARTMENT_OTHER): Payer: Self-pay

## 2024-10-04 ENCOUNTER — Other Ambulatory Visit: Payer: Self-pay

## 2024-10-04 NOTE — Telephone Encounter (Signed)
 Labs received from: [facility]  Drawn on: 09/26/2024  Reviewed by: Dr. Dolphus  Labs drawn:  CBC  CMP  FERRITIN,SERUM  FREE T4-TPB  IRON AND TIBC   LIPID   TSH  VITAMIN B12  VITAMIN D      Results: HDL 66 LDL/HDL RATIO 1.0  A copy of labs have been sent to the scan center.

## 2024-10-06 ENCOUNTER — Other Ambulatory Visit: Payer: Self-pay

## 2024-10-06 NOTE — Progress Notes (Signed)
 Specialty Pharmacy Refill Coordination Note  Wendy [name] is a 58 y.o. female contacted today regarding refills of specialty medication(s) Secukinumab  (Cosentyx  Sensoready (300 MG))   Patient requested Delivery   Delivery date: 10/10/24   Verified address: [address] [place] [address]   Medication will be filled on: 10/09/24

## 2024-10-09 ENCOUNTER — Other Ambulatory Visit: Payer: Self-pay

## 2024-10-10 ENCOUNTER — Ambulatory Visit: Admitting: Rheumatology

## 2024-10-14 ENCOUNTER — Other Ambulatory Visit (HOSPITAL_BASED_OUTPATIENT_CLINIC_OR_DEPARTMENT_OTHER): Payer: Self-pay

## 2024-10-16 ENCOUNTER — Other Ambulatory Visit (HOSPITAL_BASED_OUTPATIENT_CLINIC_OR_DEPARTMENT_OTHER): Payer: Self-pay

## 2024-10-18 ENCOUNTER — Other Ambulatory Visit (HOSPITAL_BASED_OUTPATIENT_CLINIC_OR_DEPARTMENT_OTHER): Payer: Self-pay

## 2024-10-30 ENCOUNTER — Other Ambulatory Visit: Payer: Self-pay

## 2024-11-01 ENCOUNTER — Other Ambulatory Visit (HOSPITAL_BASED_OUTPATIENT_CLINIC_OR_DEPARTMENT_OTHER): Payer: Self-pay

## 2024-11-02 ENCOUNTER — Other Ambulatory Visit (HOSPITAL_BASED_OUTPATIENT_CLINIC_OR_DEPARTMENT_OTHER): Payer: Self-pay

## 2024-11-02 ENCOUNTER — Other Ambulatory Visit: Payer: Self-pay

## 2024-11-02 MED ORDER — WEGOVY 2.4 MG/0.75ML ~~LOC~~ SOAJ
2.4000 mg | SUBCUTANEOUS | 5 refills | Status: AC
  Filled 2024-11-02 – 2024-12-13 (×5): qty 3, 28d supply, fill #0
  Filled 2024-12-20: qty 6, 56d supply, fill #1
  Filled 2024-12-20: qty 3, 28d supply, fill #1

## 2024-11-06 ENCOUNTER — Other Ambulatory Visit: Payer: Self-pay

## 2024-11-06 NOTE — Progress Notes (Signed)
 Specialty Pharmacy Refill Coordination Note  Wendy [name] is a 58 y.o. female contacted today regarding refills of specialty medication(s) Secukinumab  (Cosentyx  Sensoready (300 MG))   Patient requested Delivery   Delivery date: 11/08/24   Verified address: [address] [place] [address]   Medication will be filled on: 11/07/24

## 2024-11-09 ENCOUNTER — Other Ambulatory Visit (HOSPITAL_BASED_OUTPATIENT_CLINIC_OR_DEPARTMENT_OTHER): Payer: Self-pay

## 2024-11-20 ENCOUNTER — Other Ambulatory Visit (HOSPITAL_BASED_OUTPATIENT_CLINIC_OR_DEPARTMENT_OTHER): Payer: Self-pay

## 2024-11-21 ENCOUNTER — Other Ambulatory Visit (HOSPITAL_BASED_OUTPATIENT_CLINIC_OR_DEPARTMENT_OTHER): Payer: Self-pay

## 2024-11-28 ENCOUNTER — Other Ambulatory Visit: Payer: Self-pay

## 2024-11-28 ENCOUNTER — Other Ambulatory Visit (HOSPITAL_BASED_OUTPATIENT_CLINIC_OR_DEPARTMENT_OTHER): Payer: Self-pay

## 2024-12-01 ENCOUNTER — Other Ambulatory Visit (HOSPITAL_COMMUNITY): Payer: Self-pay

## 2024-12-05 ENCOUNTER — Other Ambulatory Visit: Payer: Self-pay

## 2024-12-05 DIAGNOSIS — L409 Psoriasis, unspecified: Secondary | ICD-10-CM

## 2024-12-05 DIAGNOSIS — Z79899 Other long term (current) drug therapy: Secondary | ICD-10-CM

## 2024-12-05 DIAGNOSIS — L4050 Arthropathic psoriasis, unspecified: Secondary | ICD-10-CM

## 2024-12-05 MED ORDER — COSENTYX SENSOREADY (300 MG) 150 MG/ML ~~LOC~~ SOAJ
SUBCUTANEOUS | 2 refills | Status: AC
  Filled 2024-12-05: qty 2, fill #0
  Filled 2024-12-05 – 2024-12-11 (×2): qty 2, 28d supply, fill #0
  Filled 2025-01-02: qty 2, 28d supply, fill #1

## 2024-12-05 NOTE — Telephone Encounter (Signed)
 Last Fill: 09/27/2024  Labs: 09/26/2024 (scanned) HDL 66 LDL/HDL RATIO 1.0  TB Gold: 09/05/2024 TB Gold Negative   Next Visit: 01/11/2024  Last Visit: 05/02/2024  DX: Psoriatic arthritis   Current Dose per office note 05/02/2024: Cosentyx  300 mg sq injections every 4 weeks   Okay to refill Cosentyx ?  Patient called the office to see about getting a refill of her medication due to her going to [place] at the end of the month and would like to take her February dose with her so she doesn't get behind.

## 2024-12-11 ENCOUNTER — Other Ambulatory Visit (HOSPITAL_COMMUNITY): Payer: Self-pay

## 2024-12-11 ENCOUNTER — Other Ambulatory Visit (HOSPITAL_BASED_OUTPATIENT_CLINIC_OR_DEPARTMENT_OTHER): Payer: Self-pay

## 2024-12-11 NOTE — Progress Notes (Signed)
 Specialty Pharmacy Refill Coordination Note  SYRETTA KOCHEL is a 59 y.o. female contacted today regarding refills of specialty medication(s) Secukinumab  (Cosentyx  Sensoready (300 MG))   Patient requested Delivery   Delivery date: 12/13/24   Verified address: [address] [place] [address]   Medication will be filled on: 12/12/24

## 2024-12-12 ENCOUNTER — Other Ambulatory Visit (HOSPITAL_BASED_OUTPATIENT_CLINIC_OR_DEPARTMENT_OTHER): Payer: Self-pay

## 2024-12-12 ENCOUNTER — Other Ambulatory Visit: Payer: Self-pay

## 2024-12-12 MED ORDER — BUPROPION HCL ER (XL) 150 MG PO TB24
150.0000 mg | ORAL_TABLET | Freq: Every morning | ORAL | 3 refills | Status: AC
  Filled 2024-12-12: qty 90, 90d supply, fill #0

## 2024-12-12 MED ORDER — LORAZEPAM 0.5 MG PO TABS
0.5000 mg | ORAL_TABLET | Freq: Every evening | ORAL | 0 refills | Status: AC | PRN
  Filled 2024-12-12: qty 15, 15d supply, fill #0

## 2024-12-12 MED ORDER — ONDANSETRON 8 MG PO TBDP
8.0000 mg | ORAL_TABLET | Freq: Three times a day (TID) | ORAL | 1 refills | Status: AC
  Filled 2024-12-12: qty 30, 10d supply, fill #0
  Filled 2024-12-16 – 2024-12-20 (×3): qty 30, 10d supply, fill #1

## 2024-12-12 NOTE — Progress Notes (Signed)
 Specialty Pharmacy Ongoing Clinical Assessment Note  Wendy [name] is a 59 y.o. female who is being followed by the specialty pharmacy service for RxSp Psoriatic Arthritis   Patient's specialty medication(s) reviewed today: Secukinumab  (Cosentyx  Sensoready (300 MG))   Missed doses in the last 4 weeks: 0   Patient/Caregiver did not have any additional questions or concerns.   Therapeutic benefit summary: Patient is achieving benefit   Adverse events/side effects summary: No adverse events/side effects   Patient's therapy is appropriate to: Continue    Goals Addressed             This Visit's Progress    Maintain optimal adherence to therapy   On track    Patient is on track. Patient will maintain adherence and adhere to provider and/or lab appointments         Follow up: 12 months  Silvano LOISE [name] Specialty Pharmacist

## 2024-12-13 ENCOUNTER — Other Ambulatory Visit (HOSPITAL_BASED_OUTPATIENT_CLINIC_OR_DEPARTMENT_OTHER): Payer: Self-pay

## 2024-12-14 ENCOUNTER — Telehealth: Payer: Self-pay | Admitting: Rheumatology

## 2024-12-14 ENCOUNTER — Other Ambulatory Visit (HOSPITAL_BASED_OUTPATIENT_CLINIC_OR_DEPARTMENT_OTHER): Payer: Self-pay

## 2024-12-14 ENCOUNTER — Other Ambulatory Visit: Payer: Self-pay

## 2024-12-14 DIAGNOSIS — Z79899 Other long term (current) drug therapy: Secondary | ICD-10-CM

## 2024-12-14 MED ORDER — WEGOVY 2.4 MG/0.75ML ~~LOC~~ SOAJ
2.4000 mg | SUBCUTANEOUS | 3 refills | Status: AC
  Filled 2024-12-14: qty 6, 56d supply, fill #0

## 2024-12-14 NOTE — Telephone Encounter (Signed)
 Cosentyx  refill was sent to the pharmacy on 12/05/2024. Called patient to advise.

## 2024-12-14 NOTE — Telephone Encounter (Signed)
 Patient contacted the office to request a medication refill.   1. Name of Medication: Cosentyx   2. How are you currently taking this medication (dosage and times per day)? 2 injections every 4 weeks   3. What pharmacy would you like for that to be sent to? [facility]

## 2024-12-15 ENCOUNTER — Ambulatory Visit: Payer: Self-pay | Admitting: Rheumatology

## 2024-12-15 LAB — CBC WITH DIFFERENTIAL/PLATELET
Absolute Lymphocytes: 1581 {cells}/uL (ref 850–3900)
Absolute Monocytes: 348 {cells}/uL (ref 200–950)
Basophils Absolute: 68 {cells}/uL (ref 0–200)
Basophils Relative: 1.3 %
Eosinophils Absolute: 317 {cells}/uL (ref 15–500)
Eosinophils Relative: 6.1 %
HCT: 41.2 % (ref 35.9–46.0)
Hemoglobin: 13.1 g/dL (ref 11.7–15.5)
MCH: 28.2 pg (ref 27.0–33.0)
MCHC: 31.8 g/dL (ref 31.6–35.4)
MCV: 88.8 fL (ref 81.4–101.7)
MPV: 11.7 fL (ref 7.5–12.5)
Monocytes Relative: 6.7 %
Neutro Abs: 2886 {cells}/uL (ref 1500–7800)
Neutrophils Relative %: 55.5 %
Platelets: 225 Thousand/uL (ref 140–400)
RBC: 4.64 Million/uL (ref 3.80–5.10)
RDW: 12.9 % (ref 11.0–15.0)
Total Lymphocyte: 30.4 %
WBC: 5.2 Thousand/uL (ref 3.8–10.8)

## 2024-12-15 LAB — COMPREHENSIVE METABOLIC PANEL WITH GFR
AG Ratio: 1.4 (calc) (ref 1.0–2.5)
ALT: 12 U/L (ref 6–29)
AST: 16 U/L (ref 10–35)
Albumin: 4.6 g/dL (ref 3.6–5.1)
Alkaline phosphatase (APISO): 70 U/L (ref 37–153)
BUN: 12 mg/dL (ref 7–25)
CO2: 28 mmol/L (ref 20–32)
Calcium: 9.6 mg/dL (ref 8.6–10.4)
Chloride: 100 mmol/L (ref 98–110)
Creat: 0.72 mg/dL (ref 0.50–1.03)
Globulin: 3.2 g/dL (ref 1.9–3.7)
Glucose, Bld: 87 mg/dL (ref 65–99)
Potassium: 4.4 mmol/L (ref 3.5–5.3)
Sodium: 137 mmol/L (ref 135–146)
Total Bilirubin: 0.5 mg/dL (ref 0.2–1.2)
Total Protein: 7.8 g/dL (ref 6.1–8.1)
eGFR: 96 mL/min/1.73m2

## 2024-12-18 ENCOUNTER — Other Ambulatory Visit (HOSPITAL_BASED_OUTPATIENT_CLINIC_OR_DEPARTMENT_OTHER): Payer: Self-pay

## 2024-12-19 ENCOUNTER — Other Ambulatory Visit (HOSPITAL_BASED_OUTPATIENT_CLINIC_OR_DEPARTMENT_OTHER): Payer: Self-pay

## 2024-12-20 ENCOUNTER — Other Ambulatory Visit (HOSPITAL_BASED_OUTPATIENT_CLINIC_OR_DEPARTMENT_OTHER): Payer: Self-pay

## 2024-12-21 ENCOUNTER — Other Ambulatory Visit (HOSPITAL_BASED_OUTPATIENT_CLINIC_OR_DEPARTMENT_OTHER): Payer: Self-pay

## 2024-12-22 ENCOUNTER — Other Ambulatory Visit (HOSPITAL_BASED_OUTPATIENT_CLINIC_OR_DEPARTMENT_OTHER): Payer: Self-pay

## 2024-12-25 ENCOUNTER — Other Ambulatory Visit: Payer: Self-pay

## 2025-01-02 ENCOUNTER — Other Ambulatory Visit (HOSPITAL_COMMUNITY): Payer: Self-pay

## 2025-01-02 ENCOUNTER — Other Ambulatory Visit: Payer: Self-pay

## 2025-01-04 ENCOUNTER — Other Ambulatory Visit (HOSPITAL_COMMUNITY): Payer: Self-pay

## 2025-01-10 ENCOUNTER — Ambulatory Visit: Admitting: Rheumatology

## 2025-02-28 ENCOUNTER — Ambulatory Visit: Admitting: Rheumatology
